# Patient Record
Sex: Female | Born: 1970 | Race: Black or African American | Hispanic: No | Marital: Single | Smoking: Current every day smoker
Health system: Southern US, Community
[De-identification: ages and names within clinical notes are randomized; demographics above are authoritative.]

## PROBLEM LIST (undated history)

## (undated) DIAGNOSIS — I10 Essential (primary) hypertension: Secondary | ICD-10-CM

## (undated) DIAGNOSIS — F329 Major depressive disorder, single episode, unspecified: Secondary | ICD-10-CM

## (undated) DIAGNOSIS — M7710 Lateral epicondylitis, unspecified elbow: Secondary | ICD-10-CM

## (undated) DIAGNOSIS — E785 Hyperlipidemia, unspecified: Secondary | ICD-10-CM

## (undated) DIAGNOSIS — K219 Gastro-esophageal reflux disease without esophagitis: Secondary | ICD-10-CM

## (undated) DIAGNOSIS — F1910 Other psychoactive substance abuse, uncomplicated: Secondary | ICD-10-CM

## (undated) DIAGNOSIS — F32A Depression, unspecified: Secondary | ICD-10-CM

## (undated) DIAGNOSIS — E079 Disorder of thyroid, unspecified: Secondary | ICD-10-CM

## (undated) DIAGNOSIS — I639 Cerebral infarction, unspecified: Secondary | ICD-10-CM

## (undated) DIAGNOSIS — F419 Anxiety disorder, unspecified: Secondary | ICD-10-CM

## (undated) HISTORY — DX: Anxiety disorder, unspecified: F41.9

## (undated) HISTORY — DX: Gastro-esophageal reflux disease without esophagitis: K21.9

## (undated) HISTORY — DX: Other psychoactive substance abuse, uncomplicated: F19.10

## (undated) HISTORY — PX: TUBAL LIGATION: SHX77

## (undated) HISTORY — DX: Hyperlipidemia, unspecified: E78.5

## (undated) HISTORY — PX: APPENDECTOMY: SHX54

## (undated) HISTORY — DX: Depression, unspecified: F32.A

## (undated) HISTORY — DX: Disorder of thyroid, unspecified: E07.9

---
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
## 2000-12-04 ENCOUNTER — Emergency Department (HOSPITAL_COMMUNITY): Admission: EM | Admit: 2000-12-04 | Discharge: 2000-12-04 | Payer: Self-pay | Admitting: Emergency Medicine

## 2000-12-04 ENCOUNTER — Encounter: Payer: Self-pay | Admitting: Emergency Medicine

## 2001-07-06 ENCOUNTER — Emergency Department (HOSPITAL_COMMUNITY): Admission: EM | Admit: 2001-07-06 | Discharge: 2001-07-06 | Payer: Self-pay | Admitting: Internal Medicine

## 2001-10-27 ENCOUNTER — Emergency Department (HOSPITAL_COMMUNITY): Admission: EM | Admit: 2001-10-27 | Discharge: 2001-10-27 | Payer: Self-pay | Admitting: Emergency Medicine

## 2001-11-01 ENCOUNTER — Ambulatory Visit (HOSPITAL_COMMUNITY): Admission: RE | Admit: 2001-11-01 | Discharge: 2001-11-01 | Payer: Self-pay | Admitting: *Deleted

## 2001-12-18 ENCOUNTER — Encounter: Payer: Self-pay | Admitting: *Deleted

## 2001-12-18 ENCOUNTER — Emergency Department (HOSPITAL_COMMUNITY): Admission: EM | Admit: 2001-12-18 | Discharge: 2001-12-18 | Payer: Self-pay | Admitting: *Deleted

## 2002-06-21 ENCOUNTER — Encounter: Payer: Self-pay | Admitting: *Deleted

## 2002-06-21 ENCOUNTER — Ambulatory Visit (HOSPITAL_COMMUNITY): Admission: RE | Admit: 2002-06-21 | Discharge: 2002-06-21 | Payer: Self-pay | Admitting: *Deleted

## 2002-08-17 ENCOUNTER — Encounter: Payer: Self-pay | Admitting: *Deleted

## 2002-08-17 ENCOUNTER — Ambulatory Visit (HOSPITAL_COMMUNITY): Admission: RE | Admit: 2002-08-17 | Discharge: 2002-08-17 | Payer: Self-pay | Admitting: *Deleted

## 2002-09-04 ENCOUNTER — Ambulatory Visit (HOSPITAL_COMMUNITY): Admission: AD | Admit: 2002-09-04 | Discharge: 2002-09-04 | Payer: Self-pay | Admitting: *Deleted

## 2002-11-07 ENCOUNTER — Ambulatory Visit (HOSPITAL_COMMUNITY): Admission: AD | Admit: 2002-11-07 | Discharge: 2002-11-07 | Payer: Self-pay | Admitting: *Deleted

## 2002-11-09 ENCOUNTER — Inpatient Hospital Stay (HOSPITAL_COMMUNITY): Admission: AD | Admit: 2002-11-09 | Discharge: 2002-11-12 | Payer: Self-pay | Admitting: *Deleted

## 2003-01-14 ENCOUNTER — Emergency Department (HOSPITAL_COMMUNITY): Admission: EM | Admit: 2003-01-14 | Discharge: 2003-01-14 | Payer: Self-pay | Admitting: *Deleted

## 2003-06-12 ENCOUNTER — Emergency Department (HOSPITAL_COMMUNITY): Admission: EM | Admit: 2003-06-12 | Discharge: 2003-06-12 | Payer: Self-pay | Admitting: Emergency Medicine

## 2003-09-12 ENCOUNTER — Encounter: Payer: Self-pay | Admitting: General Surgery

## 2003-09-12 ENCOUNTER — Ambulatory Visit (HOSPITAL_COMMUNITY): Admission: RE | Admit: 2003-09-12 | Discharge: 2003-09-12 | Payer: Self-pay | Admitting: General Surgery

## 2003-10-10 ENCOUNTER — Observation Stay (HOSPITAL_COMMUNITY): Admission: AD | Admit: 2003-10-10 | Discharge: 2003-10-11 | Payer: Self-pay | Admitting: *Deleted

## 2003-11-16 ENCOUNTER — Emergency Department (HOSPITAL_COMMUNITY): Admission: EM | Admit: 2003-11-16 | Discharge: 2003-11-16 | Payer: Self-pay | Admitting: Emergency Medicine

## 2004-01-03 ENCOUNTER — Inpatient Hospital Stay (HOSPITAL_COMMUNITY): Admission: RE | Admit: 2004-01-03 | Discharge: 2004-01-05 | Payer: Self-pay | Admitting: *Deleted

## 2004-04-02 ENCOUNTER — Emergency Department (HOSPITAL_COMMUNITY): Admission: EM | Admit: 2004-04-02 | Discharge: 2004-04-02 | Payer: Self-pay | Admitting: Emergency Medicine

## 2006-12-28 ENCOUNTER — Emergency Department (HOSPITAL_COMMUNITY): Admission: EM | Admit: 2006-12-28 | Discharge: 2006-12-28 | Payer: Self-pay | Admitting: Emergency Medicine

## 2007-10-22 ENCOUNTER — Emergency Department (HOSPITAL_COMMUNITY): Admission: EM | Admit: 2007-10-22 | Discharge: 2007-10-22 | Payer: Self-pay | Admitting: Emergency Medicine

## 2007-11-18 ENCOUNTER — Emergency Department (HOSPITAL_COMMUNITY): Admission: EM | Admit: 2007-11-18 | Discharge: 2007-11-18 | Payer: Self-pay | Admitting: Emergency Medicine

## 2008-10-05 ENCOUNTER — Emergency Department (HOSPITAL_COMMUNITY): Admission: EM | Admit: 2008-10-05 | Discharge: 2008-10-05 | Payer: Self-pay | Admitting: Emergency Medicine

## 2008-11-15 ENCOUNTER — Emergency Department (HOSPITAL_COMMUNITY): Admission: EM | Admit: 2008-11-15 | Discharge: 2008-11-15 | Payer: Self-pay | Admitting: Emergency Medicine

## 2009-12-28 ENCOUNTER — Emergency Department (HOSPITAL_COMMUNITY): Admission: EM | Admit: 2009-12-28 | Discharge: 2009-12-28 | Payer: Self-pay | Admitting: Emergency Medicine

## 2011-05-15 NOTE — Op Note (Signed)
NAME:  Alicia Flowers, Alicia Flowers                       ACCOUNT NO.:  [id number]   MEDICAL RECORD NO.:  [id number]                   PATIENT TYPE:  INP   LOCATION:  [id number]                                 FACILITY:  [facility]   PHYSICIAN:  Benedetto Goad, M.D.                DATE OF BIRTH:  [birth year]   DATE OF PROCEDURE:  01/03/2004  DATE OF DISCHARGE:                                 OPERATIVE REPORT   PREOPERATIVE DIAGNOSES:  1. 39-week intrauterine pregnancy.  2. Previous low transverse cesarean section.  3. Patient desires permanent sterilization.   PROCEDURE:  1. Repeat low transverse cesarean section with delivery of a 7 pound 2 ounce     female infant.  2. Intraoperative bilateral tubal ligation utilizing modified Pomeroy     technique.   SURGEON:  Benedetto Goad, M.D.   ESTIMATED BLOOD LOSS:  800 cc.   ANALGESIC:  Spinal.   SPECIMENS:  Arterial cord gas and cord blood to pathology laboratory.  The  placenta is examined and noted to be apparently intact with a three-vessel  umbilical cord and thus discarded.  Bilateral portion of the fallopian tubes  for permanent section only with suture material through a portion of the  left fallopian tube.   DRAINS:  JP drain within the subcutaneous space.  Foley catheter to straight  drainage.   PEDIATRICIAN:  Dr. Anitra Lauth.   FINDINGS AT THE TIME OF SURGERY:  Some fine adhesive disease within the  adnexal regions between the ovary and fallopian tube which were cauterized.  In addition, there were some omental adhesions to the anterior abdominal  wall which likewise were excised utilizing dissection utilizing cauterizing  and cutting cautery.   DESCRIPTION OF PROCEDURE:  The patient was taken to the operating room.  Vital signs were stable.  The patient had had a nonstress test performed on  the fourth floor.  The patient had a spinal analgesic administered by the  anesthesia staff, taking careful note of the patient's prior spinal which  had  resulted in a very high block.  Thus, a lower dose of medication was  used.  The patient was then placed on the OR table with a slight left  lateral tilt and prepped and draped in the usual sterile manner.  A Foley  catheter was sterilely placed to straight drainage.   After assurance of adequate surgical analgesia, a sharp knife was used to  excise the previous midline abdominal incision, bisected down to the fascial  plane utilizing a sharp knife.  Bleeders were cauterized along the way.  The  fascia was then excised in a vertical manner and then sharply dissected off  of the underlying rectus muscles.  The peritoneal cavity was atraumatically  bluntly entered at the superior-most portion of the incision.  The  peritoneal incision extended superiorly and inferiorly.  Inferiorly, we  directly visualized the bladder to avoid accidental entry.  A bladder blade  was then placed.  The lower uterine segment was identified.  A bladder flap  was created from the vesicouterine fold utilizing sharp dissection with the  Metzenbaum scissors.  A low transverse uterine incision was then scored  utilizing the Metzenbaum scissors.  The bladder flap was pushed distal to  our lower uterine segment.  A sharp knife was then used to incise a low  transverse uterine incision.  An intact amniotic sac was encountered in the  midline.  My index fingers were then used to bluntly extend the uterine  incision bilaterally.  The head of the infant was elevated and flexed and  easily delivered through the uterine incision.  The mouth and nares were  bulb suctioned of clear amniotic fluid.  Gentle traction combined with  expulsive efforts through our assistant by giving fundal pressure resulted  in very easy delivery of the infant through the uterine incision without  difficulty.  A spontaneous and vigorous breathe and cry was noted.  The  umbilical cord was milked towards the infant.  The cord was doubly clamped  and  cut, and the infant was handed to the awaiting pediatrician, Dr.  Anitra Lauth.  Arterial cord gas and cord blood were then obtained from the  umbilical cord.  Gentle traction on the umbilical cord resulted in  separation which upon examination appeared to be an intact placenta with  associated three-vessel umbilical cord.  The uterus was then exteriorized.  The uterine incision was noted to have not extended.  Intrauterine  exploration revealed no retained placental fragments.  Portions of the  membranes were removed.  Excellent uterine tone was achieved after  initiation of Pitocin and massage of the uterus.  The uterine incision was  then closed in two layers with 0 chromic in a running locked fashion, the  second layer being an imbricating layer.  Several additional figure-of-eight  sutures were required to result in hemostasis along the entirety of the  incision.  The bladder flap was reapproximated with the second suture layer.  The uterine incision was noted to be dry with no active bleeding.  The cul-  de-sac was then irrigated free of all clots.  The filmy adhesions in the  adnexal region were then cauterized.   Each of the fallopian tubes were then identified and confirmed by tracing  them out to their fimbriated ends.  Each fallopian tube was then elevated in  its midportion.  A modified Pomeroy was then performed bilaterally utilizing  #1 plain suture at the base of the __________.  A neck of right fallopian  tube as well as left fallopian tube was then excised.  A suture was placed  __________ sent off for permanent section only.  The tubal ligation  procedure was noted to be with secure hemostasis.  The uterus was then  returned to the pelvic cavity.  Sponge, needle, and instrument counts were  correct x2 at this point.  The peritoneal edges were thus grasped using  Kelly clamps.  Irrigation was performed until clear.  At this point in time, the omental adhesions described  previously were  sharply dissected utilizing cautery.  A single-layer closure was then  performed utilizing a #1 Prolene looped suture which went through the  fascial edges as well as through the peritoneal edges, resulting in  reapproximation.  An additional #1 single standard Prolene suture was then  placed over the top of this to reinforce our fascial closure.  This resulted  in a secure closure.  No subcutaneous bleeders were noted.  A JP drain was  placed within the subcutaneous space with a separate exit wound through the  right apex of the incision.  This was sutured into place with an Ethilon  suture.  About 30 cc of 0.5% bupivacaine plain was then injected along the  skin incision to facilitate analgesia at this point in time.  Two horizontal  mattress sutures of #1 Prolene were then placed to assist in stapling.  The  entirety of the skin incision was then closed utilizing skin staples.   The patient tolerated the procedure very well.  Vital signs remained stable.  She was taken to the recovery room in stable condition.      ___________________________________________                                            Benedetto Goad, M.D.   DC/MEDQ  D:  01/03/2004  T:  01/03/2004  Job:  [id number]

## 2011-05-15 NOTE — Op Note (Signed)
NAME:  Alicia, Flowers                       ACCOUNT NO.:  [id number]   MEDICAL RECORD NO.:  [id number]                   PATIENT TYPE:  INP   LOCATION:  LDR4                                 FACILITY:  [facility]   PHYSICIAN:  Benedetto Goad, M.D.                DATE OF BIRTH:  1971/07/16   DATE OF PROCEDURE:  11/10/2002  DATE OF DISCHARGE:  11/12/2002                                 OPERATIVE REPORT   PREOPERATIVE DIAGNOSES:  1. 39+ week intrauterine pregnancy for induction of labor.  2. Arrest of dilatation at 9 cm secondary to cephalopelvic disproportion.  3. Protracted active phase of labor.   POSTOPERATIVE DIAGNOSES:  1. 39+ week intrauterine pregnancy for induction of labor.  2. Arrest of dilatation at 9 cm secondary to cephalopelvic disproportion.  3. Protracted active phase of labor.   PROCEDURE PERFORMED:  Primary low transverse cesarean section, delivery of 6  pounds 15 ounces female infant.   PERTINENT FINDINGS AT TIME OF OPERATIVE PROCEDURE:  Cephalic presentation,  however, the infant was noted to be in a LOP position.   ESTIMATED BLOOD LOSS:  800 cc.   ANALGESIA:  Epidural for the course of labor which would have been  inadequate for cesarean section, thus the epidural was discontinued.  A  spinal analgesic was then administered by the anesthesia staff, however,  there were complications in that there was evidence that this would be an  excessively high spinal as the patient had difficulty breathing, thus this  was converted to general endotracheal anesthesia.   DESCRIPTION OF PROCEDURE:  The patient had been previously sterilely prepped  and draped and a Foley catheter had been sterilely placed to straight  drainage with findings of clear yellow urine.  After induction of  anesthesia, a sharp knife was used to excise the previous midline abdominal  incision which was dissected down to the fascial plane utilizing a sharp  knife and cauterizing all bleeders along  the way.  The fascia was then  incised in a vertical manner utilizing the Mayo scissors while sharply  dissecting off the underlying rectus muscle.  The rectus muscle was then  bluntly separated and the peritoneal cavity atraumatically bluntly entered.  The superior most portion of the peritoneal incision was extended superiorly  and inferiorly with direct vision of the bladder to avoid accidental entry.  A bladder blade was then placed and the lower uterine segment was  identified.  A bladder flap was then created from the vesicouterine fold  utilizing sharp dissection with the Metzenbaum scissors.  A sharp knife was  then used to score a low transverse uterine incision.  Clear amniotic fluid  was noted.  My index finger was then used to bluntly extend the uterine  incision bilaterally and my right hand then reached into the uterine cavity  and the head of the infant was flexed and elevated with gentle traction and  fundal pressure.  The head in the LOP position was noted to deliver through  the uterine incision without difficulty.  The mouth and nares were bulb  suctioned of clear amniotic fluid.  Gentle traction resulted in delivery of  the remainder of the infant.  The cord was then milked towards the infant,  the cord was doubly clamped and cut, and the infant was handed to the  awaiting pediatrician, Dr. Meda Klinefelter.  Arterial cord gas and cord blood  were then obtained.  Gentle traction on the umbilical cord resulted in  separation which upon examination appeared to be intact with a three vessel  placenta and attached cord, this was sent for permanent pathology.  The  uterus was then exteriorized and exploration revealed normal retained  placental fragments.  The uterine incision was then closed in two layers  with 0 chromic in a running, locked fashion and the second layer with  imbricating layers.  Very fine filmy adhesion disease was encountered on the  adnexa bilaterally, this  was cauterized and dissected free.  There were  noted to be omental adhesions to the anterior abdominal wall at the left  portion of the incision, these were left undisturbed.  Hemostasis was  assured.  The uterus was returned to the pelvic cavity.  Irrigation was  performed until clear.  The peritoneal edges were then grasped using Kelly  clamps, sponge and instrument counts were correct x2 at this point, and the  peritoneum was closed with a continuous, running 0 chromic suture.  The  fascia and overlying rectus muscles were then closed with a double-stranded  looped #1 PDS suture which resulted in a secure closure.  Subcutaneous  bleeders were then cauterized, a JP drain was placed in the subcutaneous  space with a separate exit wound to the right apex of the incision and this  was suture into place separately.  The skin was then closed utilizing skin  staples.  Three inter #1 Maxon sutures were placed through-and-through as  retention-type stitches.  A total of 30 cc of 0.5% bupivacaine plain was  then injected subcutaneously to facilitate postoperative anesthesia.                                               Benedetto Goad, M.D.    DC/MEDQ  D:  11/13/2002  T:  11/13/2002  Job:  [id number]

## 2011-05-15 NOTE — Op Note (Signed)
[facility]  Patient:    [name], [name] Visit Number: [id number] MRN: [id number]          Service Type: DSU Location: DAY Attending Physician:  Rayna Sexton. Dictated by:   Benedetto Goad, M.D. Proc. Date: 11/01/01 Admit Date:  11/01/2001 Discharge Date: 11/01/2001                             Operative Report  PREOPERATIVE DIAGNOSIS:  Ten-week intrauterine pregnancy with incomplete miscarriage.  POSTOPERATIVE DIAGNOSIS:  Ten-week intrauterine pregnancy with incomplete miscarriage.  PROCEDURE PERFORMED:  Dilatation and curettage utilizing a #8 ______ catheter tip.  SURGEON:  Benedetto Goad, M.D.  ESTIMATED BLOOD LOSS:  Minimal.  COMPLICATIONS:  None.  ANESTHESIA:  Paracervical block placed by Dr. Benedetto Goad and appropriate sedation per the anesthesia staff.  OPERATIVE FINDINGS:  The planned procedure was discussed again with the patient in the preoperative holding area.  She was taken to the operating room where she was placed in the dorsal lithotomy position.  The perineal area as well as the cervix were then prepped with a Betadine solution.  Posterior lip of the cervix was grasped with a single-tooth tenaculum and a paracervical block was then placed by utilizing ______ 10 cc of 1% lidocaine plain, placing it at 2 oclock, 4 oclock, 8 oclock and 10 oclock and avoiding intravascular injection.  The uterus was noted to sound to a depth of 8 cm with the external cervix as well as the endocervix ______ Dictated by:   Benedetto Goad, M.D. Attending Physician:  Rayna Sexton. DD:  11/02/01 TD:  11/04/01 Job: [id number]

## 2011-05-15 NOTE — H&P (Signed)
[facility]  Patient:    Alicia Flowers, Alicia Flowers Visit Number: [id number] MRN: [id number]          Service Type: EMS Location: ED Attending Physician:  Prentiss Bells Dictated by:   Benedetto Goad, M.D. Admit Date:  10/27/2001 Discharge Date: 10/27/2001                           History and Physical  HISTORY OF PRESENT ILLNESS:  This 40 year old gravida 1, para 0 with a last menstrual period of August 25, 2001.  The patient is absolutely certain regarding the last menstrual period.  The patient presents today as a new OB patient for initial evaluation.  The patient had a positive home pregnancy test both 1 week ago and 2 weeks ago.  She does have some nausea, but no vomiting as well as some breast tenderness; however, this has diminished over the past week.  The patient does complain of the onset of vaginal bleeding October 27, 2001 and it has continued since that point in time with light bleeding noted each day on her pad.  She denies any cramping.  She has passed some small clots.  PAST MEDICAL/SURGICAL HISTORY:  She has one prior hospitalization at age 76 for appendectomy.  No other medical or surgical history.  ALLERGIES:  She has mo known drug allergies..  CURRENT MEDICATIONS:  None.  PHYSICAL EXAMINATION:  GENERAL:  A black female in no acute distress  VITAL SIGNS:  Blood pressure 146/78, pulse rate of 80, respiratory rate is 20.   HEENT:  Negative.  NECK:  No adenopathy.  Neck is supple.  Thyroid is nonpalpable.  LUNGS:  Clear.  CARDIOVASCULAR:  Regular rate and rhythm.  ABDOMEN:  The abdomen is soft and nontender.  Uterus minimally enlarged.  PELVIC:  Sterile speculum examination reveals the presence of some dark red and brownish bleeding from the endocervix and the endocervix is noted to be already dilated with the easy passage of a small Q-tip.  Bimanual examination reveals a minimally increased sized uterus with no adnexal  masses.  LABORATORY DATA:  Both transabdominal and transvaginal ultrasound are performed.  Transabdominal ultrasound was unsatisfactory with no visualization of intrauterine contents.  A transvaginal scan was then performed which reveals a definite intrauterine sac.  The maximum sac diameter is measured at 1.41 cm; however, within that sac no pregnancy tissues are identified. Specifically there is no yolk sac, no fetal pole, and no fetal cardiac activity identified.  Laboratory studies:  CBC, type and screen, and quantitative beta hCG are requested.  Pertinently the patient had laboratory studies done October 27, 2001 at which time hemoglobin was 12.3, hematocrit 36.8 and quantitative hCG of 5492.  ASSESSMENT: The patient with missed abortion due to certainty of menstrual dates and the abnormal appearance of the pregnancy on ultrasound.  The patient is currently having vaginal bleeding, but no cramping.  PLAN:  Extensive discussion held with the patient regarding options to include expectant management versus scheduling for a dilatation and curettage.  The patient is comfortable with the diagnosis of missed abortion.  She understands and accepts the inevitability of miscarriage of this pregnancy.  Thus, at this point in time, the patient would like to proceed with dilatation and curettage on November 01, 2001.  She is referred to [facility] for preoperative laboratory assessment at this time. Dictated by:   Benedetto Goad, M.D. Attending Physician:  Prentiss Bells DD:  10/31/01  TD:  11/01/01 Job: [id number]

## 2011-05-15 NOTE — Discharge Summary (Signed)
NAME:  Alicia Flowers, Alicia Flowers                       ACCOUNT NO.:  [id number]   MEDICAL RECORD NO.:  [id number]                   PATIENT TYPE:  INP   LOCATION:  LDR4                                 FACILITY:  [facility]   PHYSICIAN:  Benedetto Goad, M.D.                DATE OF BIRTH:  [birth year]   DATE OF ADMISSION:  11/09/2002  DATE OF DISCHARGE:  11/12/2002                                 DISCHARGE SUMMARY   DIAGNOSIS:  39-week intrauterine pregnancy for induction of  labor.   PROCEDURE:  11/09/02 - placement of continuous lumbar epidural analgesia  during the course of labor by Dr. Benedetto Goad.  11/10/02 - primary low transverse cesarean section.  Delivered 6 pound 15  ounce female infant.   DISPOSITION:  The patient  is to follow up in the office in four days time  for staple removal from the midline abdominal incision. JP drain is removed  on day of discharge.   DISCHARGE MEDICATIONS:  1. Tylox for pain relief.  2. The patient does request treatment for fluid retention.  However, she is     breast feeding and thus diuretics are contraindicated.   PERTINENT LABORATORY DATA:  Admission hemoglobin, hematocrit 11.4/34.8 with  a white count of 10.2.  Postoperative day #1, 9.7/29.5 with a white count of  23.6.  White blood counts had normalized at time of discharge.   HOSPITAL COURSE:  The patient was admitted for induction of labor.  Initial  examination cervix was 3 cm dilated.  This was on 11/09/02.  Amniotomy was  performed with findings of clear amniotic fluid.  The patient with onset of  discomfort with uterine contractions and had an epidural catheter placed.  Thereafter the patient was continued on the Pitocin.  She did have a  protracted active phase, but was noted to be making cervical change.  The  patient was noted to progress to 9 cm dilatation at which time she had  arrested dilatation with no further cervical change x2 hours duration.  Thus, decision was made to proceed  with primary low transverse cesarean  section.  The epidural had required repetitive doses of 2% lidocaine to  relieve perineal pressure.  Thus, it was felt due to large volume of fluid  within the epidural space, the epidural would be inadequate for surgical  anesthesia.  Thus, in the operating room a spinal analgesic was administered  by the anesthesia staff.  However, this required revision to general  endotracheal.  The operative procedure was performed without complications.  Postoperatively, the patient did well.  She did have a JP catheter in place  in the subcutaneous space which drained very well and was removed on day of  discharge.  Foley catheter was  left in place x24 hours duration with  findings of clear yellow urine.  The patient's vital signs remained stable.  She was able to ambulate, tolerate regular general diet.  Thus, the patient  was discharged to home on postoperative day #2 given a copy of standardized  discharge instructions.  She is breast feeding at time of discharge.                                               Benedetto Goad, M.D.   DC/MEDQ  D:  11/13/2002  T:  11/13/2002  Job:  [id number]

## 2011-05-15 NOTE — H&P (Signed)
Alicia Flowers, Alicia Flowers                         ACCOUNT NO.:  [id number]   MEDICAL RECORD NO.:  [id number]                  PATIENT TYPE:   LOCATION:                                       FACILITY:   PHYSICIAN:  Benedetto Goad, M.D.                DATE OF BIRTH:   DATE OF ADMISSION:  01/03/2004  DATE OF DISCHARGE:                                HISTORY & PHYSICAL   This is a 40 year old gravida 3, para 1 at [redacted] weeks gestation who is  admitted for elective repeat cesarean section and intraoperative tubal  ligation.  The patient has 1 prior low transverse cesarean section on  November 10, 2002 and she agrees to proceed with elective repeat.  In  addition, the patient desires permanent sterilization to be performed during  this operative procedure.  She understands that are reversible forms of  birth control available. Likewise she accepts the inherent 2% failure rate  associated with the procedure.  She has been consulted extensively during  this prenatally and has never wavered in her decision to have permanent  sterilization performed.   The patient's prenatal course is uncomplicated. She was noted to have a  normal glucose tolerance test during previous pregnancy at 110.  She is A  positive blood type. The patient did present too late to have AFP triple  screening performed.  She did receive an influenza vaccine on November 19, 2003.  The patient was screened for group B Beta Strep on December 18, 2003  and was noted to be negative for a carrier status.   PAST MEDICAL HISTORY:  She had appendectomy at age 81 through a midline  incision.  She has prior low transverse cesarean section November 10, 2002  with delivery of a 6 pound 15 ounce infant.  The patient breast fed  following delivery. Indication for C-section was arrested dilatation at 9 cm  secondary to CPD.  Complications noted during the primary C-section:  The  patient had had an epidural placed during the course of labor,  however, this  did not provide adequate surgical analgesia thus a spinal was administered  by the anesthesia staff.  Notably the patient was noted to experience a high  spinal and required induction of general anesthesia for ventilatory support.  She did well throughout the remainder of the operative procedure and  subsequently had an uncomplicated postoperative course.   SOCIAL HISTORY:  The patient does smoke 1 pack per day.  The father of the  baby is deceased.  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   PHYSICAL EXAMINATION:  GENERAL:  On physical examination in no acute  distress.  VITAL SIGNS:  Height is 5 feet 3 inches.  Prepregnancy weight 145, most  recent weight 196. Blood pressure 122/66.  Pulse rate of 80, respiratory  rate is 20.  HEENT:  Negative. No adenopathy.  NECK:  Supple. Thyroid is not palpable.  LUNGS:  Clear.  CARDIOVASCULAR:  Regular rate and rhythm.  ABDOMEN:  The abdomen is soft and nontender.  Midline abdominal incision is  noted.  EXTREMITIES:  Noted to be normal.  PELVIC EXAM:  Normal external genitalia.  No lesions or ulcerations  identified.  No vaginal bleeding or leakage of fluid.   ASSESSMENT:  A 39 week intrauterine pregnancy.  A previous low-transverse  cesarean section.  The patient admitted for elective repeat as well as  intraoperative tubal ligation.  She does plan on bottle feeding during this  postpartum period.  Again, of note is that permanent sterilization will be  performed during the operative procedure.     ___________________________________________                                         Benedetto Goad, M.D.   DC/MEDQ  D:  01/02/2004  T:  01/02/2004  Job:  [id number]

## 2011-05-15 NOTE — Discharge Summary (Signed)
NAME:  Alicia Flowers, Alicia Flowers                       ACCOUNT NO.:  [id number]   MEDICAL RECORD NO.:  [id number]                   PATIENT TYPE:  INP   LOCATION:  [id number]                                 FACILITY:  [facility]   PHYSICIAN:  Benedetto Goad, M.D.                DATE OF BIRTH:  1971-11-27   DATE OF ADMISSION:  01/03/2004  DATE OF DISCHARGE:  01/05/2004                                 DISCHARGE SUMMARY   Repeat C-section, female infant.  Patient declined circumcision.   DISPOSITION:  Followup in four day's time for staple removal.   DISCHARGE MEDICATIONS:  Tylox #20 with no refills.   Given standard discharge instructions.   LABORATORY:  A positive blood type.  GBS negative.  Bottle feeding.  Hemoglobin 11.4, hematocrit 34.3, white count of 12.0.   HOSPITAL COURSE:  See previous dictations.  Uneventful repeat C-section,  tubal ligation on January 03, 2004.  The patient remained afebrile.  Abdomen  remained soft, nondistended.  The patient advanced her diet.  Tolerated a  regular general diet with resumption of bowel function.  Thus discharged to  home on January 05, 2004.     ___________________________________________                                         Benedetto Goad, M.D.   DC/MEDQ  D:  01/05/2004  T:  01/05/2004  Job:  [id number]

## 2011-05-15 NOTE — Discharge Summary (Signed)
   NAME:  Alicia Flowers, Alicia Flowers                       ACCOUNT NO.:  [id number]   MEDICAL RECORD NO.:  [id number]                   PATIENT TYPE:  OBV   LOCATION:  A414                                 FACILITY:  [facility]   PHYSICIAN:  Benedetto Goad, M.D.                DATE OF BIRTH:  [birth year]   DATE OF ADMISSION:  10/10/2003  DATE OF DISCHARGE:  10/11/2003                                 DISCHARGE SUMMARY   HISTORY OF PRESENT ILLNESS:  This is a 40 year old, G2, P56, 40 weeks'  gestation with one prior low transverse cesarean section presents to [facility] on the p.m. of October 10, 2003, with the chief complaint of  something does not feel right.  The patient initially evaluated by Daiva Nakayama, N.M. who gave a verbal orders only.  Subsequently when I returned to  my call responsibilities, I evaluated the patient and performed observation  services on October 11, 2003.   PHYSICAL EXAMINATION:  GENERAL:  The patient was noted to be in no acute  distress.  VITAL SIGNS:  Temperature 97.3, pulse 87, respirations 20, blood pressure  102/56.  ABDOMEN:  Soft and nontender.  Normal uterine tone noted.  Pfannenstiel  incision noted to be intact.  PELVIC:  Normal external genitalia.  No lesions or ulcerations identified.  The cervix was noted to be closed.  External fetal monitoring with no  uterine contractions identified.  Fetal heart rate is noted to be normal for  27 weeks' gestation.  No fetal heart rate decelerations are noted.   DISCHARGE DIAGNOSES:  1. Intrauterine pregnancy at 27 weeks.  2. Previous low transverse cesarean section.  3. Abdominal pain, not labor related.   PLAN:  Signs and symptoms of labor as well as spontaneous rupture of  membranes were reviewed with the patient who is discharged to home on the  same date of service.      ___________________________________________                                         Benedetto Goad, M.D.   DC/MEDQ  D:   10/23/2003  T:  10/23/2003  Job:  [id number]

## 2011-05-15 NOTE — H&P (Signed)
NAME:  Alicia Flowers, FRANCISCO                       ACCOUNT NO.:  [id number]   MEDICAL RECORD NO.:  [id number]                   PATIENT TYPE:  INP   LOCATION:  LDR4                                 FACILITY:  [facility]   PHYSICIAN:  Benedetto Goad, M.D.                DATE OF BIRTH:  [birth year]   DATE OF ADMISSION:  11/09/2002  DATE OF DISCHARGE:  11/12/2002                                HISTORY & PHYSICAL   HISTORY OF PRESENT ILLNESS:  A 40 year old gravida 2 para 0 at 39+ weeks  gestation is admitted for induction of labor.  Pertinently, the patient had  been seen in the office two days previously on November 07, 2002 at which  time she complained of markedly-decreased fetal movement.  She was referred  to [facility] at that time, at which time she was found to have a reactive  nonstress test.  The patient's prenatal course complicated only by a  positive drug screen on initial visit for marijuana.  The patient denies  ongoing use during the pregnancy.  She is A positive blood type.   PAST SURGICAL HISTORY:  She had spontaneous AB x1.   PAST MEDICAL HISTORY:  Negative.  The patient did have an appendectomy at  age 30 which she states was infected at that time.   ALLERGIES:  No known drug allergies.   CURRENT MEDICATIONS:  Prenatal vitamins.   PHYSICAL EXAMINATION:  VITAL SIGNS:  Height 5 feet 3 inches, 165 pounds  prepregnancy; current weight 200 pounds.  Blood pressure 116/68, pulse rate  80, respiratory rate 20.  HEENT:  Negative, no adenopathy.  NECK:  Supple.  Thyroid is nonpalpable.  LUNGS:  Clear.  CARDIOVASCULAR:  Regular rate and rhythm.  ABDOMEN:  Soft and nontender.  No surgical scars are identified with the  exception of the large midline scar from previous appendectomy.  She is  vertex presentation by Leopold's maneuvers.  Fundal height is measured at 36  cm.  EXTREMITIES:  Only trace pretibial edema.  PELVIC:  Normal external genitalia.  No lesions or ulcerations  identified.  Cervix is 3 cm dilated, 70% effaced, 0 station, with vertex presentation  confirmed.  MONITOR:  There is a reassuring fetal heart rate on external fetal monitor  and no uterine contractions are identified.   ASSESSMENT:  1. Intrauterine pregnancy at 39+ weeks.  2. Decreased fetal movement two days previously.    PLAN:  The patient is admitted for induction of labor at this time.  Due to  the moderately favorable nature of the cervix we will proceed with amniotomy  after which time we can induce or augment with Pitocin as clinically  indicated.  Benedetto Goad, M.D.    DC/MEDQ  D:  11/13/2002  T:  11/13/2002  Job:  [id number]

## 2011-05-15 NOTE — Op Note (Signed)
   NAME:  Alicia Flowers, ROETHLISBERGER                       ACCOUNT NO.:  [id number]   MEDICAL RECORD NO.:  [id number]                   PATIENT TYPE:  INP   LOCATION:  LDR4                                 FACILITY:  [facility]   PHYSICIAN:  Benedetto Goad, M.D.                DATE OF BIRTH:  Oct 17, 1971   DATE OF PROCEDURE:  11/09/2002  DATE OF DISCHARGE:  11/12/2002                                 OPERATIVE REPORT   PROCEDURE:  Placement of cutaneous lumbar epidural analgesia at the L3-4  interspace performed by Benedetto Goad, M.D.   COMPLICATIONS:  None.   SPECIMENS:  None.   DESCRIPTION OF PROCEDURE:  Appropriate informed consent was obtained.  The  patient was placed in a seated position, at which time the bony landmarks  were identified.  The L3-4 interspace was chosen.  The patient's back was  sterilely prepped and draped utilizing the epidural kit.  Five cubic  centimeters of 1% lidocaine injected at the midline at the L3-4 interspace  to raise a small skin wheal.  A 17-gauge Tuohy-Schliff needle is then  utilized with loss of resistance and an air-filled glass syringe to identify  entry into the epidural space on the first attempt without difficulty.  Excellent loss of resistance was noted.  Initial test dose of 5 cc of 1%  lidocaine plus epinephrine injected through the epidural needle and no signs  of CSF or intravascular injection obtained.  Once the epidural catheter was  inserted to a depth of 5 cm, aspiration test was negative.  A second test  dose of 2 cc of 1.5% lidocaine plus epinephrine injected through the  epidural catheter.  Again no signs of CSF or intravascular injection  obtained.  Thus the patient is connected to the infusion pump containing the  standard mixture.  She will be treated with a bolus of 10 cc, followed by a  continuous infusion of 14 cc/hr.  Following the procedure and return to the  left lateral supine position, the patient has evidence of an excellent  setting up bilateral block.  There were no complications.                                               Benedetto Goad, M.D.    DC/MEDQ  D:  11/13/2002  T:  11/13/2002  Job:  [id number]

## 2011-09-29 LAB — CBC
Platelets: 262
RBC: 3.67 — ABNORMAL LOW

## 2011-09-29 LAB — DIFFERENTIAL
Basophils Relative: 1
Eosinophils Absolute: 0.2
Eosinophils Relative: 3
Lymphs Abs: 2.3

## 2011-09-29 LAB — PREGNANCY, URINE: Preg Test, Ur: NEGATIVE

## 2011-09-29 LAB — BASIC METABOLIC PANEL
BUN: 10
Chloride: 106
GFR calc Af Amer: >60

## 2011-10-06 LAB — STREP A DNA PROBE: Group A Strep Probe: NEGATIVE

## 2011-10-07 LAB — URINALYSIS, ROUTINE W REFLEX MICROSCOPIC
Bilirubin Urine: NEGATIVE
Glucose, UA: NEGATIVE
Hgb urine dipstick: NEGATIVE
Specific Gravity, Urine: 1.025

## 2011-10-07 LAB — URINE MICROSCOPIC-ADD ON

## 2011-10-07 LAB — PREGNANCY, URINE: Preg Test, Ur: NEGATIVE

## 2011-10-07 LAB — RAPID STREP SCREEN (MED CTR MEBANE ONLY): Streptococcus, Group A Screen (Direct): NEGATIVE

## 2011-10-07 LAB — STREP A DNA PROBE

## 2013-02-02 ENCOUNTER — Encounter (HOSPITAL_COMMUNITY): Payer: Self-pay | Admitting: *Deleted

## 2013-02-02 ENCOUNTER — Emergency Department (HOSPITAL_COMMUNITY)
Admission: EM | Admit: 2013-02-02 | Discharge: 2013-02-02 | Disposition: A | Discharge status: Home / Self Care | Payer: Self-pay | Attending: Emergency Medicine | Admitting: Emergency Medicine

## 2013-02-02 DIAGNOSIS — F17200 Nicotine dependence, unspecified, uncomplicated: Secondary | ICD-10-CM | POA: Insufficient documentation

## 2013-02-02 DIAGNOSIS — L0231 Cutaneous abscess of buttock: Secondary | ICD-10-CM | POA: Insufficient documentation

## 2013-02-02 DIAGNOSIS — L03317 Cellulitis of buttock: Secondary | ICD-10-CM | POA: Insufficient documentation

## 2013-02-02 MED ORDER — CEPHALEXIN 500 MG PO CAPS: 500.0000 mg | ORAL_CAPSULE | Freq: Four times a day (QID) | ORAL | Status: DC

## 2013-02-02 MED ORDER — CEPHALEXIN 500 MG PO CAPS
500.0000 mg | ORAL_CAPSULE | Freq: Once | ORAL | Status: AC
  Administered 2013-02-02: 500 mg via ORAL
  Filled 2013-02-02: qty 1

## 2013-02-02 MED ORDER — SULFAMETHOXAZOLE-TMP DS 800-160 MG PO TABS
1.0000 | ORAL_TABLET | Freq: Once | ORAL | Status: AC
  Administered 2013-02-02: 1 via ORAL
  Filled 2013-02-02: qty 1

## 2013-02-02 MED ORDER — SULFAMETHOXAZOLE-TRIMETHOPRIM 800-160 MG PO TABS: 1.0000 | ORAL_TABLET | Freq: Two times a day (BID) | ORAL | Status: DC

## 2013-02-02 MED ORDER — HYDROCODONE-ACETAMINOPHEN 5-325 MG PO TABS: ORAL_TABLET | ORAL | Status: DC

## 2013-02-02 NOTE — ED Provider Notes (Signed)
History     CSN: [id number]  Arrival date & time 02/02/13  1102   First MD Initiated Contact with Patient 02/02/13 1123      Chief Complaint  Patient presents with  . Abscess    (Consider location/radiation/quality/duration/timing/severity/associated sxs/prior treatment) HPI Comments: Patient complains of a red swollen" pimple" to the right buttocks that began one week ago. She states that she has been applying toothpaste to the area. She reports increased pain redness to the area that finally began to drain a significant amount of pus 4 days ago. She now states the pain to the area has improved, but she has noticed an open lesion that continues to have a small amount of drainage.  She denies fever, chills, nausea or vomiting or history of previous boils or MRSA.  She denies diabetes.  Patient is a 42 y.o. female presenting with abscess. The history is provided by the patient.  Abscess  This is a new problem. Episode onset: One week. The onset was gradual. The problem occurs continuously. The problem has been gradually improving. Affected Location: Right buttock. The problem is moderate. The abscess is characterized by redness and painfulness. It is unknown what she was exposed to. Pertinent negatives include not drinking less, no fever, no diarrhea, no vomiting and no decreased responsiveness. Her past medical history does not include skin abscesses in family. There were no sick contacts. She has received no recent medical care.    History reviewed. No pertinent past medical history.  Past Surgical History  Procedure Date  . Appendectomy   . Tubal ligation     History reviewed. No pertinent family history.  History  Substance Use Topics  . Smoking status: Current Every Day Smoker    Types: Cigarettes  . Smokeless tobacco: Not on file  . Alcohol Use: Yes     Comment: socially    OB History    Grav Para Term Preterm Abortions TAB SAB Ect Mult Living                   Review of Systems  Constitutional: Negative for fever, chills, activity change, appetite change and decreased responsiveness.  Gastrointestinal: Negative for nausea, vomiting, abdominal pain and diarrhea.  Musculoskeletal: Negative for joint swelling and arthralgias.  Skin: Positive for color change and wound.       Abscess   Hematological: Negative for adenopathy.  All other systems reviewed and are negative.    Allergies  Review of patient's allergies indicates no known allergies.  Home Medications   Current Outpatient Rx  Name  Route  Sig  Dispense  Refill  . NAPHAZOLINE-GLYCERIN 0.012-0.2 % OP SOLN   Both Eyes   Place 1-2 drops into both eyes every 4 (four) hours as needed. Redness         . NAPROXEN SODIUM 220 MG PO TABS   Oral   Take 880 mg by mouth 2 (two) times daily as needed. Pain           BP 122/84  Pulse 87  Temp 98.7 F (37.1 C) (Oral)  Resp 18  Ht 5\' 3"  (1.6 m)  Wt 170 lb (77.111 kg)  BMI 30.11 kg/m2  SpO2 100%  LMP 01/14/2013  Physical Exam  Nursing note and vitals reviewed. Constitutional: She is oriented to person, place, and time. She appears well-developed and well-nourished. No distress.  HENT:  Head: Normocephalic and atraumatic.  Cardiovascular: Normal rate, regular rhythm, normal heart sounds and intact distal pulses.  No murmur heard. Pulmonary/Chest: Effort normal and breath sounds normal.  Musculoskeletal: Normal range of motion. She exhibits no edema.  Neurological: She is alert and oriented to person, place, and time. She exhibits normal muscle tone. Coordination normal.  Skin: Skin is warm. There is erythema.          3 cm open lesion to the mid right buttocks.  Mild to moderate induration with slight surrounding erythema. Granulation tissue is present to the margins.  Small amount of purulent material is present within the lesion.    ED Course  Procedures (including critical care time)  Results for orders placed  during the hospital encounter of 02/02/13  GLUCOSE, CAPILLARY      Component Value Range   Glucose-Capillary 99  70 - 99 mg/dL      Abscess culture is pending.    MDM    Patient has an open, 3 cm lesion to the right buttock with mild surrounding erythema and induration.  Pink granulation tissue present to the wound borders.    Patient also seen by the EDP and care plan discussed.  Pt agrees to return here in 4-5 days for recheck, continue warm water soaks and keep lesion bandaged.    Discussed with the patient that on return visit if wound is worsening, possible need for biopsy may be indicated, also discussed possible MRSA infection    Prescribed: kelfex Bactrim DS norco #20  Harshini Trent L. [place], [place] 02/02/13 1243

## 2013-02-02 NOTE — ED Notes (Signed)
Abscess to rt buttock, pt "busted" it and now is "raw" and painful.

## 2013-02-02 NOTE — ED Provider Notes (Signed)
Medical screening examination/treatment/procedure(s) were conducted as a shared visit with non-physician practitioner(s) and myself.  I personally evaluated the patient during the encounter Moderately obese woman with appx 3 cm diameter ulcer on her right buttock.  It had drained spontaneously.  Advised culture, antibiotic Rx with MRSA coverage, F/U in 4-5 days.  If wound does not heal, biopsy may be necessary.   Mylinda Latina III, MD 02/02/13 2028

## 2013-02-05 ENCOUNTER — Telehealth (HOSPITAL_COMMUNITY): Payer: Self-pay | Admitting: Emergency Medicine

## 2013-02-05 LAB — CULTURE, ROUTINE-ABSCESS

## 2013-02-05 NOTE — ED Notes (Signed)
[facility] called and informed Buyer, retail of +MRSA.

## 2013-02-05 NOTE — ED Notes (Signed)
+  MRSA. +Abscess. Patient treated with Septra DS. Sensitive to same. Per protocol MD.

## 2013-07-25 ENCOUNTER — Encounter (HOSPITAL_COMMUNITY): Payer: Self-pay | Admitting: *Deleted

## 2013-07-25 ENCOUNTER — Emergency Department (HOSPITAL_COMMUNITY)
Admission: EM | Admit: 2013-07-25 | Discharge: 2013-07-25 | Disposition: A | Discharge status: Home / Self Care | Payer: Self-pay | Attending: Emergency Medicine | Admitting: Emergency Medicine

## 2013-07-25 ENCOUNTER — Emergency Department (HOSPITAL_COMMUNITY): Payer: Self-pay

## 2013-07-25 DIAGNOSIS — Z7982 Long term (current) use of aspirin: Secondary | ICD-10-CM | POA: Insufficient documentation

## 2013-07-25 DIAGNOSIS — F17200 Nicotine dependence, unspecified, uncomplicated: Secondary | ICD-10-CM | POA: Insufficient documentation

## 2013-07-25 DIAGNOSIS — Z8739 Personal history of other diseases of the musculoskeletal system and connective tissue: Secondary | ICD-10-CM | POA: Insufficient documentation

## 2013-07-25 DIAGNOSIS — J3489 Other specified disorders of nose and nasal sinuses: Secondary | ICD-10-CM | POA: Insufficient documentation

## 2013-07-25 DIAGNOSIS — J069 Acute upper respiratory infection, unspecified: Secondary | ICD-10-CM | POA: Insufficient documentation

## 2013-07-25 DIAGNOSIS — Z9889 Other specified postprocedural states: Secondary | ICD-10-CM | POA: Insufficient documentation

## 2013-07-25 DIAGNOSIS — H9209 Otalgia, unspecified ear: Secondary | ICD-10-CM | POA: Insufficient documentation

## 2013-07-25 DIAGNOSIS — J029 Acute pharyngitis, unspecified: Secondary | ICD-10-CM | POA: Insufficient documentation

## 2013-07-25 DIAGNOSIS — Z791 Long term (current) use of non-steroidal anti-inflammatories (NSAID): Secondary | ICD-10-CM | POA: Insufficient documentation

## 2013-07-25 DIAGNOSIS — R509 Fever, unspecified: Secondary | ICD-10-CM | POA: Insufficient documentation

## 2013-07-25 DIAGNOSIS — J00 Acute nasopharyngitis [common cold]: Secondary | ICD-10-CM | POA: Insufficient documentation

## 2013-07-25 HISTORY — DX: Lateral epicondylitis, unspecified elbow: M77.10

## 2013-07-25 MED ORDER — GUAIFENESIN 100 MG/5ML PO SYRP: 100.0000 mg | ORAL_SOLUTION | ORAL | Status: DC | PRN

## 2013-07-25 NOTE — ED Provider Notes (Signed)
CSN: [id number]     Arrival date & time 07/25/13  1422 History    This chart was scribed for Ezequiel Essex, MD, by Neta Ehlers, ED Scribe. This patient was seen in room APFT20/APFT20 and the patient's care was started at 2:34 PM.   First MD Initiated Contact with Patient 07/25/13 1433     Chief Complaint  Patient presents with  . Cough  . Nasal Congestion    The history is provided by the patient. No language interpreter was used.   HPI Comments: Alicia Flowers is a 42 y.o. female who presents to the Emergency Department complaining of cold symptoms. She has experienced achiness, a nonproductive cough, rhinorrhea, left ear pain, sore throat which she attributes to coughing, fever, chills, and soreness to her shoulders. She denies any appetite changes, nausea, emesis, pain to her sinuses, and abdominal pain. She also denies any recent sick contacts or any recent travels. The pt has treated her symptoms with an Advil congestion, but with little resolution.   Past Medical History  Diagnosis Date  . Tennis elbow    Past Surgical History  Procedure Laterality Date  . Appendectomy    . Tubal ligation     No family history on file. History  Substance Use Topics  . Smoking status: Current Every Day Smoker    Types: Cigarettes  . Smokeless tobacco: Not on file  . Alcohol Use: Yes     Comment: socially   No OB history provided.  Review of Systems  A complete 10 system review of systems was obtained, and all systems were negative except where indicated in the HPI and PE.   Allergies  Review of patient's allergies indicates no known allergies.  Home Medications   Current Outpatient Rx  Name  Route  Sig  Dispense  Refill  . aspirin 325 MG tablet   Oral   Take 325 mg by mouth daily.         . naphazoline-glycerin (CLEAR EYES) 0.012-0.2 % SOLN   Both Eyes   Place 1-2 drops into both eyes every 4 (four) hours as needed. Redness         . naproxen sodium (ALEVE) 220  MG tablet   Oral   Take 880 mg by mouth 2 (two) times daily as needed. Pain          Triage Vitals: BP 144/88  Pulse 68  Temp(Src) 98.2 F (36.8 C) (Oral)  Resp 16  Ht 5\' 3"  (1.6 m)  Wt 160 lb (72.576 kg)  BMI 28.35 kg/m2  SpO2 100%  LMP 06/25/2013  Physical Exam  Nursing note and vitals reviewed. Constitutional: She appears well-developed and well-nourished. No distress.  HENT:  Head: Normocephalic and atraumatic.  Mouth/Throat: Oropharynx is clear and moist. No oropharyngeal exudate.  Boggy nasal turbinates. No sinus tenderness. TM normal. Throat normal.    Eyes: Conjunctivae and EOM are normal. Pupils are equal, round, and reactive to light. Right eye exhibits no discharge. Left eye exhibits no discharge. No scleral icterus.  Neck: Normal range of motion. Neck supple. No JVD present. No thyromegaly present.  Cardiovascular: Normal rate, regular rhythm, normal heart sounds and intact distal pulses.  Exam reveals no gallop and no friction rub.   No murmur heard. Pulmonary/Chest: Effort normal and breath sounds normal. No respiratory distress. She has no wheezes. She has no rales.  Lungs clear.   Abdominal: Soft. Bowel sounds are normal. She exhibits no distension and no mass. There is no  tenderness.  Musculoskeletal: Normal range of motion. She exhibits no edema and no tenderness.  Lymphadenopathy:    She has no cervical adenopathy.  Neurological: She is alert. Coordination normal.  Skin: Skin is warm and dry. No rash noted. No erythema.  Psychiatric: She has a normal mood and affect. Her behavior is normal.    ED Course   DIAGNOSTIC STUDIES: Oxygen Saturation is 100% on room air, normal by my interpretation.    COORDINATION OF CARE:  2:37 PM- Discussed treatment plan with patient, and the patient agreed to the plan.    Procedures (including critical care time)  Labs Reviewed - No data to display Dg Chest 2 View  07/25/2013   *RADIOLOGY REPORT*  Clinical Data:  Cough  CHEST - 2 VIEW  Comparison: December 22, 2007  Findings: Lungs clear.  The heart size and pulmonary vascularity are normal.  No adenopathy.  No bone lesions.  IMPRESSION: No abnormality noted.   Original Report Authenticated By: Lowella Grip, M.D.   No diagnosis found.  MDM  2 Days of nonproductive cough with body aches, congestion, chills. No chest pain or shortness of breath. No abdominal pain or vomiting. No fevers.  Patient appears well and nontoxic. Lungs clear. Chest x-ray negative. Supportive care for URI, return precautions discussed. No indication for antibiotics.   I personally performed the services described in this documentation, which was scribed in my presence. The recorded information has been reviewed and is accurate.    Ezequiel Essex, MD 07/25/13 1530

## 2013-07-25 NOTE — ED Notes (Signed)
Non productive cough with body aches and nasal congestion x 2 days.  Chills and hot flashes.

## 2014-02-11 ENCOUNTER — Emergency Department (HOSPITAL_COMMUNITY)
Admission: EM | Admit: 2014-02-11 | Discharge: 2014-02-11 | Disposition: A | Discharge status: Home / Self Care | Payer: Self-pay | Attending: Emergency Medicine | Admitting: Emergency Medicine

## 2014-02-11 ENCOUNTER — Encounter (HOSPITAL_COMMUNITY): Payer: Self-pay | Admitting: Emergency Medicine

## 2014-02-11 DIAGNOSIS — R197 Diarrhea, unspecified: Secondary | ICD-10-CM | POA: Insufficient documentation

## 2014-02-11 DIAGNOSIS — H109 Unspecified conjunctivitis: Secondary | ICD-10-CM | POA: Insufficient documentation

## 2014-02-11 DIAGNOSIS — Z7982 Long term (current) use of aspirin: Secondary | ICD-10-CM | POA: Insufficient documentation

## 2014-02-11 DIAGNOSIS — F17200 Nicotine dependence, unspecified, uncomplicated: Secondary | ICD-10-CM | POA: Insufficient documentation

## 2014-02-11 DIAGNOSIS — Z8739 Personal history of other diseases of the musculoskeletal system and connective tissue: Secondary | ICD-10-CM | POA: Insufficient documentation

## 2014-02-11 MED ORDER — TETRACAINE HCL 0.5 % OP SOLN
OPHTHALMIC | Status: AC
  Filled 2014-02-11: qty 2

## 2014-02-11 MED ORDER — DIPHENOXYLATE-ATROPINE 2.5-0.025 MG PO TABS
1.0000 | ORAL_TABLET | Freq: Four times a day (QID) | ORAL | Status: DC | PRN
Start: 2014-02-11 — End: 2019-09-28

## 2014-02-11 MED ORDER — FLUORESCEIN SODIUM 1 MG OP STRP
ORAL_STRIP | OPHTHALMIC | Status: AC
  Administered 2014-02-11: 11:00:00
  Filled 2014-02-11: qty 1

## 2014-02-11 MED ORDER — DIPHENOXYLATE-ATROPINE 2.5-0.025 MG PO TABS
2.0000 | ORAL_TABLET | Freq: Once | ORAL | Status: AC
  Administered 2014-02-11: 2 via ORAL
  Filled 2014-02-11: qty 2

## 2014-02-11 MED ORDER — TOBRAMYCIN 0.3 % OP SOLN
1.0000 [drp] | OPHTHALMIC | Status: DC
  Administered 2014-02-11: 1 [drp] via OPHTHALMIC
  Filled 2014-02-11: qty 5

## 2014-02-11 MED ORDER — KETOROLAC TROMETHAMINE 0.5 % OP SOLN
1.0000 [drp] | Freq: Four times a day (QID) | OPHTHALMIC | Status: DC
  Administered 2014-02-11: 1 [drp] via OPHTHALMIC
  Filled 2014-02-11: qty 5

## 2014-02-11 MED ORDER — TETRACAINE HCL 0.5 % OP SOLN
1.0000 [drp] | Freq: Once | OPHTHALMIC | Status: AC
  Administered 2014-02-11: 11:00:00 via OPHTHALMIC

## 2014-02-11 NOTE — ED Notes (Signed)
Pt c/o right eye soreness, redness, swelling and clear drainage x 2 days. Selling around right eye with some redness noted. Pt also c/o 2 bumps to left posterior/alteral upper arm that started 4 days ago that itches

## 2014-02-11 NOTE — Discharge Instructions (Signed)
Conjunctivitis Conjunctivitis is commonly called "pink eye." Conjunctivitis can be caused by bacterial or viral infection, allergies, or injuries. There is usually redness of the lining of the eye, itching, discomfort, and sometimes discharge. There may be deposits of matter along the eyelids. A viral infection usually causes a watery discharge, while a bacterial infection causes a yellowish, thick discharge. Pink eye is very contagious and spreads by direct contact. You may be given antibiotic eyedrops as part of your treatment. Before using your eye medicine, remove all drainage from the eye by washing gently with warm water and cotton balls. Continue to use the medication until you have awakened 2 mornings in a row without discharge from the eye. Do not rub your eye. This increases the irritation and helps spread infection. Use separate towels from other household members. Wash your hands with soap and water before and after touching your eyes. Use cold compresses to reduce pain and sunglasses to relieve irritation from light. Do not wear contact lenses or wear eye makeup until the infection is gone. SEEK MEDICAL CARE IF:   Your symptoms are not better after 3 days of treatment.  You have increased pain or trouble seeing.  The outer eyelids become very red or swollen. Document Released: 01/21/2005 Document Revised: 03/07/2012 Document Reviewed: 12/14/2005 [facility] Patient Information 2014 [place].  Diarrhea Diarrhea is watery poop (stool). It can make you feel weak, tired, thirsty, or give you a dry mouth (signs of dehydration). Watery poop is a sign of another problem, most often an infection. It often lasts 2 3 days. It can last longer if it is a sign of something serious. Take care of yourself as told by your doctor. HOME CARE   Drink 1 cup (8 ounces) of fluid each time you have watery poop.  Do not drink the following fluids:  Those that contain simple sugars (fructose, glucose,  galactose, lactose, sucrose, maltose).  Sports drinks.  Fruit juices.  Whole milk products.  Sodas.  Drinks with caffeine (coffee, tea, soda) or alcohol.  Oral rehydration solution may be used if the doctor says it is okay. You may make your own solution. Follow this recipe:    teaspoon table salt.   teaspoon baking soda.   teaspoon salt substitute containing potassium chloride.  1 tablespoons sugar.  1 liter (34 ounces) of water.  Avoid the following foods:  High fiber foods, such as raw fruits and vegetables.  Nuts, seeds, and whole grain breads and cereals.   Those that are sweetened with sugar alcohols (xylitol, sorbitol, mannitol).  Try eating the following foods:  Starchy foods, such as rice, toast, pasta, low-sugar cereal, oatmeal, baked potatoes, crackers, and bagels.  Bananas.  Applesauce.  Eat probiotic-rich foods, such as yogurt and milk products that are fermented.  Wash your hands well after each time you have watery poop.  Only take medicine as told by your doctor.  Take a warm bath to help lessen burning or pain from having watery poop. GET HELP RIGHT AWAY IF:   You cannot drink fluids without throwing up (vomiting).  You keep throwing up.  You have blood in your poop, or your poop looks black and tarry.  You do not pee (urinate) in 6 8 hours, or there is only a small amount of very dark pee.  You have belly (abdominal) pain that gets worse or stays in the same spot (localizes).  You are weak, dizzy, confused, or lightheaded.  You have a very bad headache.  Your watery poop gets worse or does not get better.  You have a fever or lasting symptoms for more than 2 3 days.  You have a fever and your symptoms suddenly get worse. MAKE SURE YOU:   Understand these instructions.  Will watch your condition.  Will get help right away if you are not doing well or get worse. Document Released: 06/01/2008 Document Revised: 09/07/2012  Document Reviewed: 08/21/2012 [facility] Patient Information 2014 [place], [place].

## 2014-02-11 NOTE — ED Provider Notes (Signed)
CSN: [id number]     Arrival date & time 02/11/14  0844 History   First MD Initiated Contact with Patient 02/11/14 0901     Chief Complaint  Patient presents with  . Eye Pain     (Consider location/radiation/quality/duration/timing/severity/associated sxs/prior Treatment) HPI Comments: JAYLON STAPEL is a 43 y.o. Female presenting with a two-day history of right eye soreness, itching, redness and clear to yellow drainage, waking this morning with her right eye matted shut.  She has possibly been exposed to pink eye.  She denies visual changes, sensation of foreign body, fevers, chills, ear pain, nasal congestion or drainage.  She does mention to "bumps" on her left upper arm which she suspects or insect bites, started 4 days ago and have been itchy but are now resolving.  She denies any pain at the site.  Additionally she states that she has developed diarrhea since yesterday, describing 2-3 bowel movements per day which are soft, not watery and without mucus or blood.  She denies abdominal pain except for cramping prior to BMs, denies fevers, no nausea or vomiting.  No other family members have this symptom.     The history is provided by the patient.    Past Medical History  Diagnosis Date  . Tennis elbow    Past Surgical History  Procedure Laterality Date  . Appendectomy    . Tubal ligation     History reviewed. No pertinent family history. History  Substance Use Topics  . Smoking status: Current Every Day Smoker    Types: Cigarettes  . Smokeless tobacco: Not on file  . Alcohol Use: Yes     Comment: socially   OB History   Grav Para Term Preterm Abortions TAB SAB Ect Mult Living                 Review of Systems  Constitutional: Negative for fever and chills.  HENT: Negative for facial swelling.   Eyes: Positive for pain, discharge, redness and itching. Negative for visual disturbance.  Respiratory: Negative for shortness of breath and wheezing.   Skin: Positive for  wound.  Neurological: Negative for numbness.      Allergies  Review of patient's allergies indicates no known allergies.  Home Medications   Current Outpatient Rx  Name  Route  Sig  Dispense  Refill  . aspirin 325 MG tablet   Oral   Take 325 mg by mouth 3 (three) times a week.          . naphazoline-glycerin (CLEAR EYES) 0.012-0.2 % SOLN   Both Eyes   Place 1-2 drops into both eyes every 4 (four) hours as needed. Redness         . diphenoxylate-atropine (LOMOTIL) 2.5-0.025 MG per tablet   Oral   Take 1 tablet by mouth 4 (four) times daily as needed for diarrhea or loose stools.   30 tablet   0   . naproxen sodium (ALEVE) 220 MG tablet   Oral   Take 440 mg by mouth 2 (two) times daily as needed (tennis elbow). Pain          BP 141/78  Pulse 85  Temp(Src) 98.6 F (37 C) (Oral)  Resp 19  SpO2 100%  LMP 01/24/2014 Physical Exam  Nursing note and vitals reviewed. Constitutional: She appears well-developed and well-nourished.  HENT:  Head: Normocephalic and atraumatic.  Eyes: EOM are normal. Pupils are equal, round, and reactive to light. Right eye exhibits discharge. Right eye exhibits no  chemosis and no hordeolum. Right conjunctiva is injected. Right conjunctiva has no hemorrhage.  Visual Acuity - Bilateral Near: 20/20 ; Bilateral Distance: 20/20 ; R Near: 20/25 ; R Distance: 20/25 ; L Near: 20/20 ; L Distance: 20/20  Neck: Normal range of motion.  Cardiovascular: Normal rate, regular rhythm, normal heart sounds and intact distal pulses.   Pulmonary/Chest: Effort normal and breath sounds normal. She has no wheezes.  Abdominal: Soft. Bowel sounds are normal. There is no tenderness. There is no rebound and no guarding.  Musculoskeletal: Normal range of motion.  Neurological: She is alert.  Skin: Skin is warm and dry.  2 small macules left upper arm, no erythema, no red streaking.  Excoriations present.  No drainage.  Nontender.  Psychiatric: She has a normal mood  and affect.    ED Course  Procedures (including critical care time) Labs Review Labs Reviewed - No data to display Imaging Review No results found.  EKG Interpretation   None       MDM   Final diagnoses:  Conjunctivitis of right eye  Diarrhea    Patient with symptoms suggesting a viral conjunctivitis.  She will be covered with Tobrex solution and this was given to her.  She was also given ketorolac eyedrops for inflammation relief.  Advised to wash her hands frequently and avoid rubbing her face and eyes. She was given a one-time dose of Lomotil, prescription for additional Lomotil if needed.  Encouraged plenty of fluids, rest, discussed BRAT diet.  Patient  is without any abdominalpain or distress today.  Suspect mild viral enteritis.  Plan to followup for any worsened symptoms.    Evalee Jefferson, PA-C 02/12/14 1818

## 2014-02-12 NOTE — ED Provider Notes (Signed)
Medical screening examination/treatment/procedure(s) were performed by non-physician practitioner and as supervising physician I was immediately available for consultation/collaboration.  EKG Interpretation   None         Maudry Diego, MD 02/12/14 2337

## 2019-07-26 ENCOUNTER — Other Ambulatory Visit: Payer: Self-pay

## 2019-07-26 ENCOUNTER — Emergency Department (HOSPITAL_COMMUNITY)
Admission: EM | Admit: 2019-07-26 | Discharge: 2019-07-26 | Disposition: A | Discharge status: Home / Self Care | Payer: Self-pay | Attending: Emergency Medicine | Admitting: Emergency Medicine

## 2019-07-26 ENCOUNTER — Encounter (HOSPITAL_COMMUNITY): Payer: Self-pay | Admitting: *Deleted

## 2019-07-26 DIAGNOSIS — Z5321 Procedure and treatment not carried out due to patient leaving prior to being seen by health care provider: Secondary | ICD-10-CM | POA: Insufficient documentation

## 2019-07-26 DIAGNOSIS — R200 Anesthesia of skin: Secondary | ICD-10-CM | POA: Insufficient documentation

## 2019-07-26 NOTE — ED Notes (Signed)
Reported by registration that pt left at 1932

## 2019-07-26 NOTE — ED Triage Notes (Signed)
Pt with bilateral hand numbness for last 4-5 days, arms feel heavy first thing in the morning.  C/o pins and needles in her fingers.

## 2019-09-04 DIAGNOSIS — I639 Cerebral infarction, unspecified: Secondary | ICD-10-CM

## 2019-09-04 DIAGNOSIS — I10 Essential (primary) hypertension: Secondary | ICD-10-CM

## 2019-09-04 HISTORY — DX: Essential (primary) hypertension: I10

## 2019-09-04 HISTORY — DX: Cerebral infarction, unspecified: I63.9

## 2019-09-14 ENCOUNTER — Other Ambulatory Visit: Payer: Self-pay

## 2019-09-14 ENCOUNTER — Inpatient Hospital Stay (HOSPITAL_COMMUNITY): Payer: Medicaid Other

## 2019-09-14 ENCOUNTER — Emergency Department (HOSPITAL_COMMUNITY): Payer: Medicaid Other

## 2019-09-14 ENCOUNTER — Inpatient Hospital Stay (HOSPITAL_COMMUNITY)
Admission: EM | Admit: 2019-09-14 | Discharge: 2019-09-28 | DRG: 673 | Disposition: A | Discharge status: Home Health | Payer: Medicaid Other | Attending: Internal Medicine | Admitting: Internal Medicine

## 2019-09-14 ENCOUNTER — Encounter (HOSPITAL_COMMUNITY): Payer: Self-pay | Admitting: Emergency Medicine

## 2019-09-14 DIAGNOSIS — G459 Transient cerebral ischemic attack, unspecified: Secondary | ICD-10-CM

## 2019-09-14 DIAGNOSIS — Z794 Long term (current) use of insulin: Secondary | ICD-10-CM

## 2019-09-14 DIAGNOSIS — M609 Myositis, unspecified: Secondary | ICD-10-CM | POA: Diagnosis present

## 2019-09-14 DIAGNOSIS — J189 Pneumonia, unspecified organism: Secondary | ICD-10-CM | POA: Diagnosis present

## 2019-09-14 DIAGNOSIS — I4510 Unspecified right bundle-branch block: Secondary | ICD-10-CM | POA: Diagnosis present

## 2019-09-14 DIAGNOSIS — M6282 Rhabdomyolysis: Secondary | ICD-10-CM | POA: Diagnosis present

## 2019-09-14 DIAGNOSIS — N179 Acute kidney failure, unspecified: Secondary | ICD-10-CM | POA: Diagnosis present

## 2019-09-14 DIAGNOSIS — R0902 Hypoxemia: Secondary | ICD-10-CM

## 2019-09-14 DIAGNOSIS — R52 Pain, unspecified: Secondary | ICD-10-CM

## 2019-09-14 DIAGNOSIS — R946 Abnormal results of thyroid function studies: Secondary | ICD-10-CM | POA: Diagnosis present

## 2019-09-14 DIAGNOSIS — E875 Hyperkalemia: Secondary | ICD-10-CM

## 2019-09-14 DIAGNOSIS — E782 Mixed hyperlipidemia: Secondary | ICD-10-CM | POA: Diagnosis present

## 2019-09-14 DIAGNOSIS — R319 Hematuria, unspecified: Secondary | ICD-10-CM | POA: Diagnosis not present

## 2019-09-14 DIAGNOSIS — K7010 Alcoholic hepatitis without ascites: Secondary | ICD-10-CM | POA: Diagnosis present

## 2019-09-14 DIAGNOSIS — M7989 Other specified soft tissue disorders: Secondary | ICD-10-CM

## 2019-09-14 DIAGNOSIS — R7989 Other specified abnormal findings of blood chemistry: Secondary | ICD-10-CM | POA: Diagnosis present

## 2019-09-14 DIAGNOSIS — F17210 Nicotine dependence, cigarettes, uncomplicated: Secondary | ICD-10-CM | POA: Diagnosis present

## 2019-09-14 DIAGNOSIS — R739 Hyperglycemia, unspecified: Secondary | ICD-10-CM | POA: Diagnosis present

## 2019-09-14 DIAGNOSIS — I6389 Other cerebral infarction: Secondary | ICD-10-CM | POA: Diagnosis present

## 2019-09-14 DIAGNOSIS — Z716 Tobacco abuse counseling: Secondary | ICD-10-CM

## 2019-09-14 DIAGNOSIS — N186 End stage renal disease: Secondary | ICD-10-CM

## 2019-09-14 DIAGNOSIS — E872 Acidosis: Secondary | ICD-10-CM | POA: Diagnosis present

## 2019-09-14 DIAGNOSIS — R7401 Elevation of levels of liver transaminase levels: Secondary | ICD-10-CM | POA: Diagnosis present

## 2019-09-14 DIAGNOSIS — F1410 Cocaine abuse, uncomplicated: Secondary | ICD-10-CM | POA: Diagnosis present

## 2019-09-14 DIAGNOSIS — Z7151 Drug abuse counseling and surveillance of drug abuser: Secondary | ICD-10-CM

## 2019-09-14 DIAGNOSIS — F1010 Alcohol abuse, uncomplicated: Secondary | ICD-10-CM | POA: Diagnosis present

## 2019-09-14 DIAGNOSIS — R29898 Other symptoms and signs involving the musculoskeletal system: Secondary | ICD-10-CM

## 2019-09-14 DIAGNOSIS — D6489 Other specified anemias: Secondary | ICD-10-CM | POA: Diagnosis present

## 2019-09-14 DIAGNOSIS — Z8249 Family history of ischemic heart disease and other diseases of the circulatory system: Secondary | ICD-10-CM

## 2019-09-14 DIAGNOSIS — I708 Atherosclerosis of other arteries: Secondary | ICD-10-CM | POA: Diagnosis present

## 2019-09-14 DIAGNOSIS — Z79899 Other long term (current) drug therapy: Secondary | ICD-10-CM

## 2019-09-14 DIAGNOSIS — G9341 Metabolic encephalopathy: Secondary | ICD-10-CM | POA: Diagnosis present

## 2019-09-14 DIAGNOSIS — I639 Cerebral infarction, unspecified: Secondary | ICD-10-CM

## 2019-09-14 DIAGNOSIS — E039 Hypothyroidism, unspecified: Secondary | ICD-10-CM | POA: Diagnosis present

## 2019-09-14 DIAGNOSIS — F1290 Cannabis use, unspecified, uncomplicated: Secondary | ICD-10-CM | POA: Diagnosis present

## 2019-09-14 DIAGNOSIS — R7981 Abnormal blood-gas level: Secondary | ICD-10-CM

## 2019-09-14 DIAGNOSIS — R34 Anuria and oliguria: Secondary | ICD-10-CM | POA: Diagnosis present

## 2019-09-14 DIAGNOSIS — Z992 Dependence on renal dialysis: Secondary | ICD-10-CM | POA: Diagnosis not present

## 2019-09-14 DIAGNOSIS — Z7982 Long term (current) use of aspirin: Secondary | ICD-10-CM

## 2019-09-14 DIAGNOSIS — Z8673 Personal history of transient ischemic attack (TIA), and cerebral infarction without residual deficits: Secondary | ICD-10-CM

## 2019-09-14 DIAGNOSIS — Z7141 Alcohol abuse counseling and surveillance of alcoholic: Secondary | ICD-10-CM

## 2019-09-14 DIAGNOSIS — R945 Abnormal results of liver function studies: Secondary | ICD-10-CM

## 2019-09-14 DIAGNOSIS — Z20828 Contact with and (suspected) exposure to other viral communicable diseases: Secondary | ICD-10-CM | POA: Diagnosis present

## 2019-09-14 HISTORY — DX: Pneumonia, unspecified organism: J18.9

## 2019-09-14 LAB — CBC WITH DIFFERENTIAL/PLATELET
Abs Immature Granulocytes: 0.06 10*3/uL (ref 0.00–0.07)
Basophils Absolute: 0.1 10*3/uL (ref 0.0–0.1)
Basophils Relative: 0 %
Eosinophils Absolute: 0.1 10*3/uL (ref 0.0–0.5)
Eosinophils Relative: 1 %
HCT: 50.9 % — ABNORMAL HIGH (ref 36.0–46.0)
Hemoglobin: 15.7 g/dL — ABNORMAL HIGH (ref 12.0–15.0)
Immature Granulocytes: 0 %
Lymphocytes Relative: 4 %
Lymphs Abs: 0.6 10*3/uL — ABNORMAL LOW (ref 0.7–4.0)
MCH: 30.5 pg (ref 26.0–34.0)
MCHC: 30.8 g/dL (ref 30.0–36.0)
MCV: 98.8 fL (ref 80.0–100.0)
Monocytes Absolute: 1.4 10*3/uL — ABNORMAL HIGH (ref 0.1–1.0)
Monocytes Relative: 9 %
Neutro Abs: 12.2 10*3/uL — ABNORMAL HIGH (ref 1.7–7.7)
Neutrophils Relative %: 86 %
Platelets: 375 10*3/uL (ref 150–400)
RBC: 5.15 MIL/uL — ABNORMAL HIGH (ref 3.87–5.11)
RDW: 16 % — ABNORMAL HIGH (ref 11.5–15.5)
WBC: 14.4 10*3/uL — ABNORMAL HIGH (ref 4.0–10.5)
nRBC: 0 % (ref 0.0–0.2)

## 2019-09-14 LAB — URINALYSIS, ROUTINE W REFLEX MICROSCOPIC
Bilirubin Urine: NEGATIVE
Glucose, UA: 50 mg/dL — AB
Ketones, ur: NEGATIVE mg/dL
Leukocytes,Ua: NEGATIVE
Nitrite: NEGATIVE
Protein, ur: 100 mg/dL — AB
Specific Gravity, Urine: 1.017 (ref 1.005–1.030)
Squamous Epithelial / LPF: >50 — ABNORMAL HIGH (ref 0–5)
pH: 6 (ref 5.0–8.0)

## 2019-09-14 LAB — COMPREHENSIVE METABOLIC PANEL
ALT: 932 U/L — ABNORMAL HIGH (ref 0–44)
ALT: 959 U/L — ABNORMAL HIGH (ref 0–44)
ALT: 898 U/L — ABNORMAL HIGH (ref 0–44)
AST: 2087 U/L — ABNORMAL HIGH (ref 15–41)
AST: 2046 U/L — ABNORMAL HIGH (ref 15–41)
AST: 1780 U/L — ABNORMAL HIGH (ref 15–41)
Albumin: 3.7 g/dL (ref 3.5–5.0)
Albumin: 3.5 g/dL (ref 3.5–5.0)
Albumin: 3.3 g/dL — ABNORMAL LOW (ref 3.5–5.0)
Alkaline Phosphatase: 102 U/L (ref 38–126)
Alkaline Phosphatase: 108 U/L (ref 38–126)
Alkaline Phosphatase: 131 U/L — ABNORMAL HIGH (ref 38–126)
Anion gap: 17 — ABNORMAL HIGH (ref 5–15)
Anion gap: 15 (ref 5–15)
Anion gap: 15 (ref 5–15)
BUN: 48 mg/dL — ABNORMAL HIGH (ref 6–20)
BUN: 51 mg/dL — ABNORMAL HIGH (ref 6–20)
BUN: 56 mg/dL — ABNORMAL HIGH (ref 6–20)
CO2: 18 mmol/L — ABNORMAL LOW (ref 22–32)
CO2: 17 mmol/L — ABNORMAL LOW (ref 22–32)
CO2: 19 mmol/L — ABNORMAL LOW (ref 22–32)
Calcium: 6.5 mg/dL — ABNORMAL LOW (ref 8.9–10.3)
Calcium: 6.5 mg/dL — ABNORMAL LOW (ref 8.9–10.3)
Calcium: 6.1 mg/dL — CL (ref 8.9–10.3)
Chloride: 101 mmol/L (ref 98–111)
Chloride: 102 mmol/L (ref 98–111)
Chloride: 101 mmol/L (ref 98–111)
Creatinine, Ser: 4.77 mg/dL — ABNORMAL HIGH (ref 0.44–1.00)
Creatinine, Ser: 4.7 mg/dL — ABNORMAL HIGH (ref 0.44–1.00)
Creatinine, Ser: 4.97 mg/dL — ABNORMAL HIGH (ref 0.44–1.00)
GFR calc Af Amer: 12 mL/min — ABNORMAL LOW (ref >60)
GFR calc Af Amer: 12 mL/min — ABNORMAL LOW (ref >60)
GFR calc Af Amer: 11 mL/min — ABNORMAL LOW (ref >60)
GFR calc non Af Amer: 10 mL/min — ABNORMAL LOW (ref >60)
GFR calc non Af Amer: 10 mL/min — ABNORMAL LOW (ref >60)
GFR calc non Af Amer: 10 mL/min — ABNORMAL LOW (ref >60)
Glucose, Bld: 163 mg/dL — ABNORMAL HIGH (ref 70–99)
Glucose, Bld: 145 mg/dL — ABNORMAL HIGH (ref 70–99)
Glucose, Bld: 162 mg/dL — ABNORMAL HIGH (ref 70–99)
Potassium: 6.6 mmol/L (ref 3.5–5.1)
Potassium: 6.9 mmol/L (ref 3.5–5.1)
Potassium: 6.5 mmol/L (ref 3.5–5.1)
Sodium: 136 mmol/L (ref 135–145)
Sodium: 134 mmol/L — ABNORMAL LOW (ref 135–145)
Sodium: 135 mmol/L (ref 135–145)
Total Bilirubin: 0.3 mg/dL (ref 0.3–1.2)
Total Bilirubin: 0.4 mg/dL (ref 0.3–1.2)
Total Bilirubin: 0.3 mg/dL (ref 0.3–1.2)
Total Protein: 7.8 g/dL (ref 6.5–8.1)
Total Protein: 7.5 g/dL (ref 6.5–8.1)
Total Protein: 6.9 g/dL (ref 6.5–8.1)

## 2019-09-14 LAB — PROTIME-INR
INR: 1.1 (ref 0.8–1.2)
Prothrombin Time: 13.6 seconds (ref 11.4–15.2)

## 2019-09-14 LAB — PHOSPHORUS: Phosphorus: 10.5 mg/dL — ABNORMAL HIGH (ref 2.5–4.6)

## 2019-09-14 LAB — BLOOD GAS, VENOUS
Acid-base deficit: 9.9 mmol/L — ABNORMAL HIGH (ref 0.0–2.0)
Bicarbonate: 14.3 mmol/L — ABNORMAL LOW (ref 20.0–28.0)
FIO2: 32
O2 Saturation: 53.7 %
Patient temperature: 36.5
pCO2, Ven: 55.9 mmHg (ref 44.0–60.0)
pH, Ven: 7.13 — CL (ref 7.250–7.430)
pO2, Ven: 38.1 mmHg (ref 32.0–45.0)

## 2019-09-14 LAB — CBC
HCT: 49.1 % — ABNORMAL HIGH (ref 36.0–46.0)
Hemoglobin: 15.3 g/dL — ABNORMAL HIGH (ref 12.0–15.0)
MCH: 30.8 pg (ref 26.0–34.0)
MCHC: 31.2 g/dL (ref 30.0–36.0)
MCV: 99 fL (ref 80.0–100.0)
Platelets: 329 10*3/uL (ref 150–400)
RBC: 4.96 MIL/uL (ref 3.87–5.11)
RDW: 15.9 % — ABNORMAL HIGH (ref 11.5–15.5)
WBC: 13.5 10*3/uL — ABNORMAL HIGH (ref 4.0–10.5)
nRBC: 0.1 % (ref 0.0–0.2)

## 2019-09-14 LAB — LACTIC ACID, PLASMA: Lactic Acid, Venous: 2.4 mmol/L (ref 0.5–1.9)

## 2019-09-14 LAB — BRAIN NATRIURETIC PEPTIDE: B Natriuretic Peptide: 427 pg/mL — ABNORMAL HIGH (ref 0.0–100.0)

## 2019-09-14 LAB — LIPASE, BLOOD: Lipase: 27 U/L (ref 11–51)

## 2019-09-14 LAB — RAPID URINE DRUG SCREEN, HOSP PERFORMED
Amphetamines: NOT DETECTED
Barbiturates: NOT DETECTED
Benzodiazepines: NOT DETECTED
Cocaine: POSITIVE — AB
Opiates: NOT DETECTED
Tetrahydrocannabinol: POSITIVE — AB

## 2019-09-14 LAB — GLUCOSE, CAPILLARY
Glucose-Capillary: 163 mg/dL — ABNORMAL HIGH (ref 70–99)
Glucose-Capillary: 132 mg/dL — ABNORMAL HIGH (ref 70–99)

## 2019-09-14 LAB — TRIGLYCERIDES: Triglycerides: 177 mg/dL — ABNORMAL HIGH (ref <150)

## 2019-09-14 LAB — ETHANOL: Alcohol, Ethyl (B): <10 mg/dL (ref <10)

## 2019-09-14 LAB — ECHOCARDIOGRAM COMPLETE
Height: 63 in
Weight: 2758.4 oz

## 2019-09-14 LAB — VITAMIN B12: Vitamin B-12: 499 pg/mL (ref 180–914)

## 2019-09-14 LAB — SARS CORONAVIRUS 2 BY RT PCR (HOSPITAL ORDER, PERFORMED IN ~~LOC~~ HOSPITAL LAB): SARS Coronavirus 2: NEGATIVE

## 2019-09-14 LAB — URIC ACID: Uric Acid, Serum: 12 mg/dL — ABNORMAL HIGH (ref 2.5–7.1)

## 2019-09-14 LAB — MAGNESIUM
Magnesium: 2.5 mg/dL — ABNORMAL HIGH (ref 1.7–2.4)
Magnesium: 2.7 mg/dL — ABNORMAL HIGH (ref 1.7–2.4)

## 2019-09-14 LAB — TROPONIN I (HIGH SENSITIVITY)
Troponin I (High Sensitivity): 7286 ng/L (ref <18)
Troponin I (High Sensitivity): 8025 ng/L (ref <18)
Troponin I (High Sensitivity): 6579 ng/L (ref <18)

## 2019-09-14 LAB — CK: Total CK: >50000 U/L — ABNORMAL HIGH (ref 38–234)

## 2019-09-14 LAB — TSH: TSH: 7.473 u[IU]/mL — ABNORMAL HIGH (ref 0.350–4.500)

## 2019-09-14 LAB — AMMONIA: Ammonia: 27 umol/L (ref 9–35)

## 2019-09-14 MED ORDER — SODIUM CHLORIDE 0.9 % IV SOLN: 250.0000 mL | INTRAVENOUS | Status: DC | PRN

## 2019-09-14 MED ORDER — SODIUM CHLORIDE 0.9 % IV SOLN
1.0000 g | Freq: Once | INTRAVENOUS | Status: AC
  Administered 2019-09-14: 1 g via INTRAVENOUS
  Filled 2019-09-14: qty 10

## 2019-09-14 MED ORDER — FUROSEMIDE 10 MG/ML IJ SOLN
40.0000 mg | Freq: Once | INTRAMUSCULAR | Status: AC
  Administered 2019-09-14: 40 mg via INTRAVENOUS
  Filled 2019-09-14: qty 4

## 2019-09-14 MED ORDER — SODIUM CHLORIDE 0.9 % IV SOLN
500.0000 mg | INTRAVENOUS | Status: DC
  Administered 2019-09-14: 14:00:00 500 mg via INTRAVENOUS
  Filled 2019-09-14: qty 500

## 2019-09-14 MED ORDER — ONDANSETRON HCL 4 MG/2ML IJ SOLN
4.0000 mg | Freq: Four times a day (QID) | INTRAMUSCULAR | Status: DC | PRN
  Administered 2019-09-14: 4 mg via INTRAVENOUS
  Filled 2019-09-14: qty 2

## 2019-09-14 MED ORDER — SODIUM CHLORIDE 0.9 % IV SOLN
2.0000 g | INTRAVENOUS | Status: AC
  Administered 2019-09-15 – 2019-09-19 (×5): 2 g via INTRAVENOUS
  Filled 2019-09-14 (×5): qty 20

## 2019-09-14 MED ORDER — SODIUM BICARBONATE 8.4 % IV SOLN
INTRAVENOUS | Status: AC
  Filled 2019-09-14: qty 50

## 2019-09-14 MED ORDER — SODIUM CHLORIDE 0.9% FLUSH
3.0000 mL | Freq: Two times a day (BID) | INTRAVENOUS | Status: DC
  Administered 2019-09-15 – 2019-09-22 (×7): 3 mL via INTRAVENOUS

## 2019-09-14 MED ORDER — SODIUM POLYSTYRENE SULFONATE 15 GM/60ML PO SUSP
15.0000 g | Freq: Once | ORAL | Status: AC
  Administered 2019-09-14: 15 g via ORAL
  Filled 2019-09-14 (×2): qty 60

## 2019-09-14 MED ORDER — SODIUM CHLORIDE 0.9% FLUSH
3.0000 mL | INTRAVENOUS | Status: DC | PRN
  Administered 2019-09-22: 3 mL via INTRAVENOUS
  Filled 2019-09-14: qty 3

## 2019-09-14 MED ORDER — CALCIUM GLUCONATE-NACL 1-0.675 GM/50ML-% IV SOLN
1.0000 g | Freq: Once | INTRAVENOUS | Status: AC
  Administered 2019-09-14: 10:00:00 1000 mg via INTRAVENOUS
  Filled 2019-09-14: qty 50

## 2019-09-14 MED ORDER — INSULIN ASPART 100 UNIT/ML ~~LOC~~ SOLN
0.0000 [IU] | Freq: Every day | SUBCUTANEOUS | Status: DC
  Administered 2019-09-25: 2 [IU] via SUBCUTANEOUS

## 2019-09-14 MED ORDER — VITAMIN B-1 100 MG PO TABS
100.0000 mg | ORAL_TABLET | Freq: Every day | ORAL | Status: DC
  Administered 2019-09-14 – 2019-09-28 (×12): 100 mg via ORAL
  Filled 2019-09-14 (×13): qty 1

## 2019-09-14 MED ORDER — NICOTINE 14 MG/24HR TD PT24
14.0000 mg | MEDICATED_PATCH | Freq: Every day | TRANSDERMAL | Status: DC
  Administered 2019-09-15 – 2019-09-28 (×13): 14 mg via TRANSDERMAL
  Filled 2019-09-14 (×13): qty 1

## 2019-09-14 MED ORDER — SODIUM CHLORIDE 0.9% FLUSH
3.0000 mL | Freq: Two times a day (BID) | INTRAVENOUS | Status: DC
  Administered 2019-09-15: 3 mL via INTRAVENOUS

## 2019-09-14 MED ORDER — ASPIRIN EC 81 MG PO TBEC
81.0000 mg | DELAYED_RELEASE_TABLET | Freq: Every day | ORAL | Status: DC
  Administered 2019-09-14 – 2019-09-28 (×12): 81 mg via ORAL
  Filled 2019-09-14 (×15): qty 1

## 2019-09-14 MED ORDER — INSULIN ASPART 100 UNIT/ML ~~LOC~~ SOLN
0.0000 [IU] | Freq: Three times a day (TID) | SUBCUTANEOUS | Status: DC
  Administered 2019-09-14: 2 [IU] via SUBCUTANEOUS
  Administered 2019-09-15 – 2019-09-20 (×9): 1 [IU] via SUBCUTANEOUS
  Administered 2019-09-22: 17:00:00 2 [IU] via SUBCUTANEOUS
  Administered 2019-09-22 – 2019-09-23 (×2): 1 [IU] via SUBCUTANEOUS
  Administered 2019-09-23 – 2019-09-24 (×3): 2 [IU] via SUBCUTANEOUS
  Administered 2019-09-25: 1 [IU] via SUBCUTANEOUS
  Administered 2019-09-25: 2 [IU] via SUBCUTANEOUS
  Administered 2019-09-26 – 2019-09-27 (×3): 1 [IU] via SUBCUTANEOUS
  Administered 2019-09-27: 17:00:00 2 [IU] via SUBCUTANEOUS

## 2019-09-14 MED ORDER — SODIUM BICARBONATE 8.4 % IV SOLN
50.0000 meq | Freq: Once | INTRAVENOUS | Status: AC
  Administered 2019-09-14: 12:00:00 50 meq via INTRAVENOUS

## 2019-09-14 MED ORDER — SODIUM CHLORIDE 0.9% FLUSH: 3.0000 mL | INTRAVENOUS | Status: DC | PRN

## 2019-09-14 MED ORDER — SODIUM CHLORIDE 0.9 % IV SOLN: 2.0000 g | INTRAVENOUS | Status: DC

## 2019-09-14 MED ORDER — INSULIN ASPART 100 UNIT/ML IV SOLN
6.0000 [IU] | Freq: Once | INTRAVENOUS | Status: AC
  Administered 2019-09-14: 6 [IU] via INTRAVENOUS

## 2019-09-14 MED ORDER — STERILE WATER FOR INJECTION IV SOLN
INTRAVENOUS | Status: DC
  Administered 2019-09-14: 16:00:00 via INTRAVENOUS
  Filled 2019-09-14 (×3): qty 850

## 2019-09-14 MED ORDER — TRAMADOL HCL 50 MG PO TABS
50.0000 mg | ORAL_TABLET | Freq: Three times a day (TID) | ORAL | Status: DC | PRN
  Administered 2019-09-14 – 2019-09-28 (×24): 50 mg via ORAL
  Filled 2019-09-14 (×25): qty 1

## 2019-09-14 MED ORDER — PANTOPRAZOLE SODIUM 40 MG PO TBEC
40.0000 mg | DELAYED_RELEASE_TABLET | Freq: Every day | ORAL | Status: DC
  Administered 2019-09-14 – 2019-09-28 (×12): 40 mg via ORAL
  Filled 2019-09-14 (×14): qty 1

## 2019-09-14 MED ORDER — THIAMINE HCL 100 MG/ML IJ SOLN
100.0000 mg | Freq: Every day | INTRAMUSCULAR | Status: DC
  Administered 2019-09-19 – 2019-09-20 (×2): 100 mg via INTRAVENOUS
  Filled 2019-09-14 (×4): qty 2

## 2019-09-14 MED ORDER — DEXTROSE 50 % IV SOLN: 1.0000 | Freq: Once | INTRAVENOUS | Status: DC

## 2019-09-14 MED ORDER — FOLIC ACID 1 MG PO TABS
1.0000 mg | ORAL_TABLET | Freq: Every day | ORAL | Status: DC
  Administered 2019-09-14 – 2019-09-28 (×12): 1 mg via ORAL
  Filled 2019-09-14 (×13): qty 1

## 2019-09-14 MED ORDER — VITAMIN B-1 100 MG PO TABS
100.0000 mg | ORAL_TABLET | Freq: Once | ORAL | Status: AC
  Administered 2019-09-14: 100 mg via ORAL
  Filled 2019-09-14: qty 1

## 2019-09-14 MED ORDER — SODIUM CHLORIDE 0.9 % IV BOLUS
500.0000 mL | Freq: Once | INTRAVENOUS | Status: AC
  Administered 2019-09-14: 10:00:00 500 mL via INTRAVENOUS

## 2019-09-14 MED ORDER — DEXTROSE 50 % IV SOLN
INTRAVENOUS | Status: AC
  Administered 2019-09-14: 50 mL via INTRAVENOUS
  Filled 2019-09-14: qty 50

## 2019-09-14 MED ORDER — INSULIN ASPART 100 UNIT/ML IV SOLN: 10.0000 [IU] | Freq: Once | INTRAVENOUS | Status: DC

## 2019-09-14 MED ORDER — DEXTROSE 50 % IV SOLN
50.0000 mL | Freq: Once | INTRAVENOUS | Status: AC
  Administered 2019-09-14: 10:00:00 50 mL via INTRAVENOUS

## 2019-09-14 MED ORDER — STERILE WATER FOR INJECTION IV SOLN
INTRAVENOUS | Status: DC
  Administered 2019-09-14 – 2019-09-15 (×2): via INTRAVENOUS
  Filled 2019-09-14 (×10): qty 850

## 2019-09-14 MED ORDER — SODIUM ZIRCONIUM CYCLOSILICATE 10 G PO PACK
10.0000 g | PACK | Freq: Every day | ORAL | Status: DC
  Administered 2019-09-14 – 2019-09-16 (×3): 10 g via ORAL
  Filled 2019-09-14 (×4): qty 1

## 2019-09-14 MED ORDER — SODIUM CHLORIDE 0.9 % IV SOLN
100.0000 mg | Freq: Once | INTRAVENOUS | Status: AC
  Administered 2019-09-14: 100 mg via INTRAVENOUS
  Filled 2019-09-14 (×2): qty 100

## 2019-09-14 MED ORDER — ADULT MULTIVITAMIN W/MINERALS CH
1.0000 | ORAL_TABLET | Freq: Every day | ORAL | Status: DC
  Administered 2019-09-14 – 2019-09-28 (×12): 1 via ORAL
  Filled 2019-09-14 (×13): qty 1

## 2019-09-14 MED ORDER — LORAZEPAM 1 MG PO TABS: 1.0000 mg | ORAL_TABLET | ORAL | Status: AC | PRN

## 2019-09-14 MED ORDER — LORAZEPAM 2 MG/ML IJ SOLN
1.0000 mg | INTRAMUSCULAR | Status: AC | PRN
  Administered 2019-09-14: 2 mg via INTRAVENOUS
  Administered 2019-09-15: 1 mg via INTRAVENOUS
  Administered 2019-09-15: 21:00:00 2 mg via INTRAVENOUS
  Administered 2019-09-15: 1 mg via INTRAVENOUS
  Administered 2019-09-16 (×2): 4 mg via INTRAVENOUS
  Administered 2019-09-17: 2 mg via INTRAVENOUS
  Filled 2019-09-14: qty 2
  Filled 2019-09-14 (×3): qty 1
  Filled 2019-09-14: qty 2
  Filled 2019-09-14 (×3): qty 1

## 2019-09-14 MED ORDER — HEPARIN SODIUM (PORCINE) 5000 UNIT/ML IJ SOLN
5000.0000 [IU] | Freq: Three times a day (TID) | INTRAMUSCULAR | Status: AC
  Administered 2019-09-14 – 2019-09-18 (×11): 5000 [IU] via SUBCUTANEOUS
  Filled 2019-09-14 (×16): qty 1

## 2019-09-14 NOTE — ED Notes (Signed)
Pt increased to 3L of O2 per [place]

## 2019-09-14 NOTE — ED Notes (Signed)
CRITICAL VALUE ALERT  Critical Value:  K 6.9, trop 7286  Date & Time Notied:  09/14/2019, 1345  Provider Notified: Dr. Dyann Kief  Orders Received/Actions taken: no further orders at this time.

## 2019-09-14 NOTE — ED Notes (Signed)
ED Provider at bedside. 

## 2019-09-14 NOTE — H&P (Signed)
History and Physical    Alicia Flowers [id number] DOB: 1971/02/29 DOA: 09/14/2019  Referring MD/NP/PA: Dr. Reather Converse PCP: Patient, No Pcp Per  Patient coming from: Home  Chief Complaint: Left arm and left leg numbness; hypoxia.  HPI: Alicia Flowers is a 48 y.o. female with a medical history significant for tobacco abuse, alcohol abuse, cocaine abuse and marijuana use; who presented to the hospital secondary to worsening numbness in her left upper extremity and left leg.  Patient reports his symptoms have been present for approximately 3 months but worsening in the last 2 to 3 days prior to admission.  Patient also expressed feeling slightly more tired and having decrease oral intake.  No chest pain, no nausea, no vomiting, no productive cough, no sick contacts.  Patient reports to continue smoking about half pack per day and is drinking on daily basis.  Last use of marijuana was the night prior to admission.   In the ED patient was found to be mildly hypoxic, with elevated WBCs, acute renal failure with a creatinine close to 5, hyperkalemic, hypophosphatemic with a normal radiologic studies suggesting community-acquired pneumonia.  She also has a positive MRI demonstrating right occipital stroke.  Cultures were taken, sodium bicarbonate, insulin and calcium gluconate provided for hyperkalemia.  Patient is started on IV antibiotics and [facility] has been contacted to admit patient for further evaluation and management.  Past Medical/Surgical History: Past Medical History:  Diagnosis Date   Tennis elbow     Past Surgical History:  Procedure Laterality Date   APPENDECTOMY     TUBAL LIGATION      Social History:  reports that she has been smoking cigarettes. She has been smoking about 0.50 packs per day. She has never used smokeless tobacco. She reports current alcohol use. She reports current drug use. Drug: Marijuana.  Allergies: No Known Allergies  Family History: Patient reports a  history of hypertension otherwise noncontributory family history.  Prior to Admission medications   Medication Sig Start Date End Date Taking? Authorizing Provider  aspirin 325 MG tablet Take 325 mg by mouth 3 (three) times a week.    Yes [provider]  diphenoxylate-atropine (LOMOTIL) 2.5-0.025 MG per tablet Take 1 tablet by mouth 4 (four) times daily as needed for diarrhea or loose stools. 02/11/14  Yes Idol, Almyra Free, PA-C  naphazoline-glycerin (CLEAR EYES) 0.012-0.2 % SOLN Place 1-2 drops into both eyes every 4 (four) hours as needed. Redness   Yes [provider]  naproxen sodium (ALEVE) 220 MG tablet Take 440 mg by mouth 2 (two) times daily as needed (tennis elbow). Pain   Yes [provider]    Review of Systems:  Negative except as otherwise mentioned in HPI.  Physical Exam: Vitals:   09/14/19 1138 09/14/19 1300 09/14/19 1400 09/14/19 1510  BP:  135/87 (!) 146/87   Pulse: 88     Resp: (!) 8  11   Temp:      SpO2: 92%     Weight:    78.2 kg  Height:        Constitutional: Afebrile, somnolent but easily aroused.  Denies chest pain, no nausea, no vomiting. Eyes: PERRL, lids and conjunctivae normal, no icterus, no nystagmus. ENMT: Mucous membranes are moist. Posterior pharynx clear of any exudate or lesions.  Neck: normal, supple, no masses, no thyromegaly, no JVD. Respiratory: Positive rhonchi, mild tachypnea, no using accessory muscles.  Patient found to be hypoxic requiring 2 L nasal cannula supplementation.  Cardiovascular: Regular  rate and rhythm, no murmurs / rubs / gallops. No carotid bruits.  Abdomen: no tenderness, no masses palpated. No hepatosplenomegaly. Bowel sounds positive.  Musculoskeletal: No joint deformity upper and lower extremities. Good ROM, no contractures. Normal muscle tone.  Trace edema bilaterally appreciated on exam. Skin: no rashes, no petechiae, no open wounds. Neurologic: CN 2-12 grossly intact. Sensation intact, DTR  normal. Strength 5/5 in all 4.  Psychiatric: Normal judgment and insight. Alert and oriented x 3. Normal mood.    Labs on Admission: I have personally reviewed the following labs and imaging studies  CBC: Recent Labs  Lab 09/14/19 0819 09/14/19 1210  WBC 14.4* 13.5*  NEUTROABS 12.2*  --   HGB 15.7* 15.3*  HCT 50.9* 49.1*  MCV 98.8 99.0  PLT 375 [id number]   Basic Metabolic Panel: Recent Labs  Lab 09/14/19 0819 09/14/19 1153 09/14/19 1210  NA 136  --  134*  K 6.6*  --  6.9*  CL 101  --  102  CO2 18*  --  17*  GLUCOSE 163*  --  145*  BUN 48*  --  51*  CREATININE 4.77*  --  4.70*  CALCIUM 6.5*  --  6.5*  MG  --  2.5* 2.7*  PHOS  --   --  10.5*   GFR: Estimated Creatinine Clearance: 14.6 mL/min (A) (by C-G formula based on SCr of 4.7 mg/dL (H)).   Liver Function Tests: Recent Labs  Lab 09/14/19 0819 09/14/19 1210  AST 2,087* 2,046*  ALT 932* 959*  ALKPHOS 102 108  BILITOT 0.3 0.4  PROT 7.8 7.5  ALBUMIN 3.7 3.5   Recent Labs  Lab 09/14/19 1153  LIPASE 27   Recent Labs  Lab 09/14/19 1153  AMMONIA 27   Coagulation Profile: Recent Labs  Lab 09/14/19 0819  INR 1.1   CBG: Recent Labs  Lab 09/14/19 1615  GLUCAP 163*   Lipid Profile: Recent Labs    09/14/19 1153  TRIG 177*   Thyroid Function Tests: Recent Labs    09/14/19 1209  TSH 7.473*   Anemia Panel: Recent Labs    09/14/19 1209  VITAMINB12 499   Urine analysis:    Component Value Date/Time   COLORURINE AMBER (A) 09/14/2019 0841   APPEARANCEUR TURBID (A) 09/14/2019 0841   LABSPEC 1.017 09/14/2019 0841   PHURINE 6.0 09/14/2019 0841   GLUCOSEU 50 (A) 09/14/2019 0841   HGBUR LARGE (A) 09/14/2019 0841   BILIRUBINUR NEGATIVE 09/14/2019 [place] 09/14/2019 0841   PROTEINUR 100 (A) 09/14/2019 0841   UROBILINOGEN 1.0 10/22/2007 1645   NITRITE NEGATIVE 09/14/2019 0841   LEUKOCYTESUR NEGATIVE 09/14/2019 0841    Recent Results (from the past 240 hour(s))  SARS  Coronavirus 2 [facility] order, Performed in [facility] hospital lab) Nasopharyngeal Nasopharyngeal Swab     Status: None   Collection Time: 09/14/19 10:04 AM   Specimen: Nasopharyngeal Swab  Result Value Ref Range Status   SARS Coronavirus 2 NEGATIVE NEGATIVE Final    Comment: (NOTE) If result is NEGATIVE SARS-CoV-2 target nucleic acids are NOT DETECTED. The SARS-CoV-2 RNA is generally detectable in upper and lower  respiratory specimens during the acute phase of infection. The lowest  concentration of SARS-CoV-2 viral copies this assay can detect is 250  copies / mL. A negative result does not preclude SARS-CoV-2 infection  and should not be used as the sole basis for treatment or other  patient management decisions.  A negative result may occur with  improper specimen collection / handling, submission of specimen other  than nasopharyngeal swab, presence of viral mutation(s) within the  areas targeted by this assay, and inadequate number of viral copies  (<250 copies / mL). A negative result must be combined with clinical  observations, patient history, and epidemiological information. If result is POSITIVE SARS-CoV-2 target nucleic acids are DETECTED. The SARS-CoV-2 RNA is generally detectable in upper and lower  respiratory specimens dur ing the acute phase of infection.  Positive  results are indicative of active infection with SARS-CoV-2.  Clinical  correlation with patient history and other diagnostic information is  necessary to determine patient infection status.  Positive results do  not rule out bacterial infection or co-infection with other viruses. If result is PRESUMPTIVE POSTIVE SARS-CoV-2 nucleic acids MAY BE PRESENT.   A presumptive positive result was obtained on the submitted specimen  and confirmed on repeat testing.  While 2019 novel coronavirus  (SARS-CoV-2) nucleic acids may be present in the submitted sample  additional confirmatory testing may be necessary  for epidemiological  and / or clinical management purposes  to differentiate between  SARS-CoV-2 and other Sarbecovirus currently known to infect humans.  If clinically indicated additional testing with an alternate test  methodology [phone]) is advised. The SARS-CoV-2 RNA is generally  detectable in upper and lower respiratory sp ecimens during the acute  phase of infection. The expected result is Negative. Fact Sheet for Patients:  StrictlyIdeas.no Fact Sheet for Healthcare Providers: BankingDealers.co.za This test is not yet approved or cleared by the [place] FDA and has been authorized for detection and/or diagnosis of SARS-CoV-2 by FDA under an Emergency Use Authorization (EUA).  This EUA will remain in effect (meaning this test can be used) for the duration of the COVID-19 declaration under Section 564(b)(1) of the Act, 21 U.S.C. section 360bbb-3(b)(1), unless the authorization is terminated or revoked sooner. Performed at [facility], [address], [place], [place] [address]      Radiological Exams on Admission: Ct Abdomen Pelvis Wo Contrast  Result Date: 09/14/2019 CLINICAL DATA:  Abdominal distension with elevated liver enzymes EXAM: CT ABDOMEN AND PELVIS WITHOUT CONTRAST TECHNIQUE: Multidetector CT imaging of the abdomen and pelvis was performed following the standard protocol without oral or IV contrast. COMPARISON:  None. FINDINGS: Lower chest: There is airspace consolidation in both lower lobes posteriorly. There is a degree of lower lobe bronchiectatic change is well. Hepatobiliary: No focal liver lesions are evident on this noncontrast enhanced study. The gallbladder wall is not appreciably thickened. There is no biliary duct dilatation. Pancreas: There is no pancreatic mass or inflammatory focus. Spleen: No splenic lesions are evident. Adrenals/Urinary Tract: Adrenals bilaterally appear unremarkable. Kidneys bilaterally  show no evident mass or hydronephrosis on either side. There is no evident renal or ureteral calculus on either side. Urinary bladder is midline with wall thickness within normal limits for degree of distention. Stomach/Bowel: The stomach is diffusely distended with air. There is no appreciable bowel wall or mesenteric thickening. No obstructing focus evident. The terminal ileum appears unremarkable. Vascular/Lymphatic: There is no abdominal aortic aneurysm. There is aortic and iliac artery atherosclerotic calcification. There is no adenopathy evident in the abdomen or pelvis. Reproductive: Uterus is anteverted.  No pelvic mass is demonstrated. Other: Appendix absent. No periappendiceal region inflammation. No abscess or ascites is evident in the abdomen or pelvis. There is postoperative change in the anterior abdominal wall with several clips present. Musculoskeletal: There are foci of degenerative change in the lumbar  spine. 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 No sacroiliitis evident. No blastic or lytic bone lesions. No intramuscular lesions evident. IMPRESSION: 1. Airspace consolidation consistent with pneumonia in each posterior lower lobe region. There is a degree of bilateral lower lobe bronchiectasis as well. 2. Stomach distended with air. Gastric wall does not appear appreciably thickened. No small or large bowel distention evident. Question a degree of ileus from the pneumonia causing the gastric distention. 3. No evident bowel obstruction. No abscess in the abdomen or pelvis. Appendix absent. No periappendiceal region inflammatory change. 4.  Aortic and iliac artery atherosclerosis noted. 5. Osteitis condensans ilia bilaterally, a potential source for abdominal pain. Electronically Signed   By: Lowella Grip III M.D.   On: 09/14/2019 11:53   Mr Brain Wo Contrast  Result Date: 09/14/2019 CLINICAL DATA:  Numbness of the left arm over the last 3 months. EXAM: MRI HEAD WITHOUT CONTRAST  TECHNIQUE: Multiplanar, multiecho pulse sequences of the brain and surrounding structures were obtained without intravenous contrast. COMPARISON:  None. 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 Otherwise, the brain does not show any old or acute small or large vessel insult. No mass lesion, hemorrhage, hydrocephalus or extra-axial collection. Vascular: Major vessels at the base of the brain show flow. This includes the posterior circulation vessels. I do not see convincing evidence venous thrombosis. There is an arachnoid granulation at the right transverse sinus sigmoid sinus junction. 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 Otherwise negative study. Etiology indeterminate. There appears to be flow in the posterior circulation main vessels. I do not see clear evidence of venous thrombosis on this noncontrast study. One could consider CT or MR venography. Electronically Signed   By: Nelson Chimes M.D.   On: 09/14/2019 10:11   Mr Cervical Spine Wo Contrast  Result Date: 09/14/2019 CLINICAL DATA:  Left arm weakness for 3 months EXAM: MRI CERVICAL SPINE WITHOUT CONTRAST TECHNIQUE: Multiplanar, multisequence MR imaging of the cervical spine was performed. No intravenous contrast was administered. COMPARISON:  None. FINDINGS: The study is limited due to patient motion. Alignment: There is straightening of the normal cervical lordosis. Vertebrae: The vertebral body heights are well maintained. No fracture, marrow edema,or pathologic marrow infiltration. Cord: Appears to be normal in signal intensity, however limited due to patient motion. Posterior Fossa, vertebral arteries, paraspinal tissues: The visualized portion of the posterior fossa is unremarkable. Normal flow voids seen within the vertebral arteries. The paraspinal soft tissues are unremarkable. Disc  levels: C1-C2: Atlanto-axial junction is normal, without canal narrowing C2-C3: No significant spinal canal or neural foraminal narrowing C3-C4: There is a minimal disc osteophyte complex, however no significant canal or neural foraminal narrowing. C4-C5: There is a disc osteophyte complex and uncovertebral osteophytes which causes mild left and mild-to-moderate right neural foraminal narrowing. C5-C6: There is a disc osteophyte complex which causes mild bilateral neural foraminal narrowing. C6-C7: Disc osteophyte complex which causes mild right neural foraminal narrowing. C7-T1: No significant spinal canal or neural foraminal narrowing IMPRESSION: The study is limited due to patient motion. Mild lumbar spine spondylosis as described above this is most notable C4-C5 with mild-to-moderate right neural foraminal narrowing. Electronically Signed   By: Prudencio Pair M.D.   On: 09/14/2019 10:02   US Renal  Result Date: 09/14/2019 CLINICAL DATA:  Acute kidney injury. EXAM: RENAL / URINARY TRACT ULTRASOUND COMPLETE COMPARISON:  None. FINDINGS: Right Kidney:  Renal measurements: 11.9 x 4.8 x 4.8 cm = volume: 149 mL. Minimally increased echogenicity of renal parenchyma is noted. No mass or hydronephrosis visualized. Left Kidney: Renal measurements: 12.4 x 5.4 x 4.8 cm = volume: 171 mL. Minimally increased echogenicity of renal parenchyma is noted. No mass or hydronephrosis visualized. Bladder: Not visualized due to body habitus. IMPRESSION: Minimally increased echogenicity of renal parenchyma is noted bilaterally suggesting medical renal disease. No hydronephrosis or renal obstruction is noted. Bladder is not visualized. Electronically Signed   By: Marijo Conception M.D.   On: 09/14/2019 13:28   US Carotid Bilateral  Result Date: 09/14/2019 CLINICAL DATA:  48 year old female with a history of TIA EXAM: BILATERAL CAROTID DUPLEX ULTRASOUND TECHNIQUE: Pearline Cables scale imaging, color Doppler and duplex ultrasound were performed of  bilateral carotid and vertebral arteries in the neck. COMPARISON:  None. FINDINGS: Criteria: Quantification of carotid stenosis is based on velocity parameters that correlate the residual internal carotid diameter with NASCET-based stenosis levels, using the diameter of the distal internal carotid lumen as the denominator for stenosis measurement. The following velocity measurements were obtained: RIGHT ICA:  Systolic 123XX123 cm/sec, Diastolic 51 cm/sec CCA:  123XX123 cm/sec SYSTOLIC ICA/CCA RATIO:  1.8 ECA:  99 cm/sec LEFT ICA:  Systolic 99 cm/sec, Diastolic 52 cm/sec CCA:  123456 cm/sec SYSTOLIC ICA/CCA RATIO:  1.5 ECA: NA Right Brachial SBP: Not acquired Left Brachial SBP: Not acquired RIGHT CAROTID ARTERY: No significant calcified disease of the right common carotid artery. Intermediate waveform maintained. Heterogeneous plaque without significant calcifications at the right carotid bifurcation. Low resistance waveform of the right ICA. No significant tortuosity. RIGHT VERTEBRAL ARTERY: Antegrade flow with low resistance waveform. LEFT CAROTID ARTERY: No significant calcified disease of the left common carotid artery. Intermediate waveform maintained. Heterogeneous plaque at the left carotid bifurcation without significant calcifications. Low resistance waveform of the left ICA. LEFT VERTEBRAL ARTERY:  Antegrade flow with low resistance waveform. IMPRESSION: Color duplex indicates minimal heterogeneous plaque, with no hemodynamically significant stenosis by duplex criteria in the extracranial cerebrovascular circulation. Signed, Dulcy Fanny. Dellia Nims, RPVI [facility] Electronically Signed   By: Corrie Mckusick D.O.   On: 09/14/2019 15:28   Dg Chest Port 1 View  Result Date: 09/14/2019 CLINICAL DATA:  Hypoxia EXAM: PORTABLE CHEST 1 VIEW COMPARISON:  06/25/2013 FINDINGS: Cardiomegaly accentuated by low volumes. Diffuse interstitial prominence. No Kerley lines, air  bronchogram, effusion or pneumothorax. Gaseous distention of the stomach. IMPRESSION: 1. Cardiomegaly accentuated by low volumes. 2. Interstitial coarsening, favor vascular congestion. 3. Prominent gaseous distension of the stomach. Electronically Signed   By: Monte Fantasia M.D.   On: 09/14/2019 10:00    EKG: Independently reviewed.  No peak T waves, right bundle branch block and borderline QT prolongation.  Patient with poor R wave progressions and some Q waves in inferior leads.  Assessment/Plan 1-hypoxia/CAP (community acquired pneumonia) -Radiologic images demonstrating bilateral lower lobe infiltrates, patient also with elevated WBCs and decrease oxygen saturation on room air. -Will start treatment for community-acquired pneumonia -Oxygen supplementation and flutter valve -Follow strep pneumo and Legionella antigen in urine -Coronavirus test negative -Follow clinical response -IV fluids given.  2-Acute renal failure (ARF) ([place]) -No recent blood work for appropriate comparison (last blood work in our records for over 10 years ago). -Patient reports intermittent use of NSAIDs as an outpatient -Decrease oral intake as not feeling good lately. -Creatinine close to 5 on presentation, along with hypocalcemia, hyperkalemia and hyperphosphatemia. -Nephrology has been consulted  and will follow recommendations. -Will avoid/minimize nephrotoxic agents -Follow renal function trend -Started on IV fluid resuscitation -Urinalysis with positive blood in urine but negative leukocyte esterase and nitrite. -Follow urine cultures; will receive empirical coverage from antibiotics given for PNA.  3-hx of Alcohol abuse -no active withdrawal appreciated currently -will give thiamine and folic acid -monitor on CIWA protocol  4-left upper extremity and left lower extremity numbness: -Symptoms has been present for over 3 months and worse in the last 2-3 days. -MRI demonstrated an occipital  stroke -Will complete a stroke work-up -Neurology has been consulted -Patient denies dysarthria or difficulty swallowing. -will check lipid panel, A1c and patient will be started on baby aspirin. -Allow for permissive hypertension.  5-Cocaine abuse ([place]) -Cessation counseling has been provided.  6-Hyperkalemia and hyperphosphatemia  -received sodium bicarbonate, insulin, calcium gluconate and kayexalate -will monitor on telemetry  -follow electrolytes trend and renal service recommendations.  7-Metabolic acidosis -Most likely associated with acute renal failure -Patient has been started on sodium bicarbonate -Follow electrolytes trend and renal service recommendations.  8-elevated troponin -Patient denies chest pain -Discussed with cardiology service (Dr. Bronson Ing), will cycle troponin, repeat EKG and check 2D echo -Unable to use beta blockers in the setting of acute cocaine usage. -Baby aspirin has been started. -No anticipation of cardiac cath currently given abnormal renal function. -Complete medical stratification.  9-hyperglycemia without prior history diabetes -Will check A1c -Patient started on sliding scale insulin. -Follow CBGs trend.  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 -Based on improvement/clinical response will consider GI service involvement.  11-tobacco abuse -Cessation counseling has been provided -Currently no wheezing -Nicotine patch has been ordered.  DVT prophylaxis: Heparin. Code Status: Full code Family Communication: No family at bedside. Disposition Plan: To be determined; but hopefully discharge back home once medically stable. Consults called: Nephrology Admission status: Inpatient, length of stay more than 2 midnights, telemetry bed.   Time Spent: 70 minutes.  Barton Dubois MD [facility] Pager  [phone]   09/14/2019, 5:44 PM

## 2019-09-14 NOTE — Progress Notes (Signed)
CRITICAL VALUE ALERT  Critical Value:  K  6.5,   Ca  6.1  Date & Time Notied:  09/14/2019   1900  Provider Notified: Dr.Madera  Orders Received/Actions taken: None

## 2019-09-14 NOTE — ED Notes (Signed)
Pt placed on 2 liters [place]

## 2019-09-14 NOTE — Progress Notes (Signed)
CRITICAL VALUE ALERT  Critical Value:  Troponin 8025  Date & Time Notied: 09/14/19, 1945  Provider Notified: [place]  Orders Received/Actions taken:

## 2019-09-14 NOTE — ED Notes (Signed)
CRITICAL VALUE ALERT  Critical Value:  Lactic 2.4  Date & Time Notied:  09/14/2019, 1342  Provider Notified: Dr. Dyann Kief  Orders Received/Actions taken: see chart

## 2019-09-14 NOTE — Discharge Instructions (Signed)
Return for worsening weakness, fevers, or new concerns.  Is very important for you to follow-up with a primary doctor to coordinate your care.

## 2019-09-14 NOTE — ED Notes (Signed)
Pt reports to RN that she drinks about a pint of tequila every night. Last drank last night. Pt also reports marijuana use, last used this morning. Pt reports she got her marijuana from her usual person and not someone different.

## 2019-09-14 NOTE — Consult Note (Signed)
Reason for Consult: AKI Referring Physician: Dyann Kief, MD  Alicia Flowers is an 48 y.o. female.  HPI: Alicia Flowers is a 48 yo AAF without significant past medical history who was brought to [facility] ED by her friends after she was noted to have ankle edema, lethargy, and was unable to mover her left arm.  Apparently she has had worsening numbness and weakness of that arm for almost 3 months but has not sought out medical attention.  She also admits to drinking alcohol daily and started yesterday afternoon when she got home from work.  She reports that a friend came by later and that she took 2 snorts of cocaine around midnight.  She denies any loss of consciousness, trauma/falls, dysuria, pyuria, hematuria, urgency, frequency, or retention.  She also denies any history of kidney disease.  She also admitted to smoking marijuana on a regular basis.    In the ED she was also noted to have hyperkalemia, metabolic acidosis, elevated AST/ALT, and UDS + for cocaine and THC.  CT scan of abd/pelvis with airspace consilidation consistent with lower lobe pneumonia and bilateral bronchiectasis.  No obstruction of kidneys.  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  We were consulted to further evaluate and manage her AKI and electrolyte abnormalities.  Trend in Creatinine: Creatinine, Ser  Date/Time Value Ref Range Status  09/14/2019 12:10 PM 4.70 (H) 0.44 - 1.00 mg/dL Final  09/14/2019 08:19 AM 4.77 (H) 0.44 - 1.00 mg/dL Final  11/15/2008 10:03 AM 0.64  Final    PMH:   Past Medical History:  Diagnosis Date  . Tennis elbow     PSH:   Past Surgical History:  Procedure Laterality Date  . APPENDECTOMY    . TUBAL LIGATION      Allergies: No Known Allergies  Medications:   Prior to Admission medications   Medication Sig Start Date End Date Taking? Authorizing Provider  aspirin 325 MG tablet Take 325 mg by mouth 3 (three) times a week.    Yes [provider]  diphenoxylate-atropine (LOMOTIL) 2.5-0.025 MG per tablet Take 1 tablet by mouth 4 (four) times daily as needed for diarrhea or loose stools. 02/11/14  Yes Idol, Almyra Free, PA-C  naphazoline-glycerin (CLEAR EYES) 0.012-0.2 % SOLN Place 1-2 drops into both eyes every 4 (four) hours as needed. Redness   Yes [provider]  naproxen sodium (ALEVE) 220 MG tablet Take 440 mg by mouth 2 (two) times daily as needed (tennis elbow). Pain   Yes [provider]    Inpatient medications: . aspirin EC  81 mg Oral Daily  . folic acid  1 mg Oral Daily  . heparin injection (subcutaneous)  5,000 Units Subcutaneous Q8H  . insulin aspart  0-5 Units Subcutaneous QHS  . insulin aspart  0-9 Units Subcutaneous TID WC  . multivitamin with minerals  1 tablet Oral Daily  . pantoprazole  40 mg Oral Daily  . sodium zirconium cyclosilicate  10 g Oral Daily  . thiamine  100 mg Oral Daily   Or  . thiamine  100 mg Intravenous Daily    Discontinued Meds:   Medications Discontinued During This Encounter  Medication Reason  . insulin aspart (novoLOG) injection 10 Units   . dextrose 50 % solution 50 mL   . cefTRIAXone (ROCEPHIN) 2 g in sodium chloride 0.9 % 100 mL IVPB     Social History:  reports that she has been smoking cigarettes. She has  been smoking about 0.50 packs per day. She has never used smokeless tobacco. She reports current alcohol use. She reports current drug use. Drug: Marijuana.  Family History:  No family history on file.  Pertinent items are noted in HPI. Weight change:   Intake/Output Summary (Last 24 hours) at 09/14/2019 1613 Last data filed at 09/14/2019 1421 Gross per 24 hour  Intake 750.83 ml  Output -  Net 750.83 ml   BP (!) 146/87   Pulse 88   Temp 98.5 F (36.9 C)   Resp 11   Ht 5\' 3"  (1.6 m)   Wt 78.2 kg   LMP 09/07/2019   SpO2 92%   BMI 30.54 kg/m  Vitals:   09/14/19 1138 09/14/19 1300 09/14/19 1400 09/14/19 1510  BP:  135/87 (!) 146/87    Pulse: 88     Resp: (!) 8  11   Temp:      SpO2: 92%     Weight:    78.2 kg  Height:         General appearance: fatigued, no distress and slowed mentation Head: Normocephalic, without obvious abnormality, atraumatic Resp: diminished breath sounds bibasilar Cardio: regular rate and rhythm and no rub GI: soft, non-tender; bowel sounds normal; no masses,  no organomegaly Extremities: edema trace pedal edema and cyanosis of toes bilaterally and cool to touch.  Labs: Basic Metabolic Panel: Recent Labs  Lab 09/14/19 0819 09/14/19 1210  NA 136 134*  K 6.6* 6.9*  CL 101 102  CO2 18* 17*  GLUCOSE 163* 145*  BUN 48* 51*  CREATININE 4.77* 4.70*  ALBUMIN 3.7 3.5  CALCIUM 6.5* 6.5*  PHOS  --  10.5*   Liver Function Tests: Recent Labs  Lab 09/14/19 0819 09/14/19 1210  AST 2,087* 2,046*  ALT 932* 959*  ALKPHOS 102 108  BILITOT 0.3 0.4  PROT 7.8 7.5  ALBUMIN 3.7 3.5   Recent Labs  Lab 09/14/19 1153  LIPASE 27   Recent Labs  Lab 09/14/19 1153  AMMONIA 27   CBC: Recent Labs  Lab 09/14/19 0819 09/14/19 1210  WBC 14.4* 13.5*  NEUTROABS 12.2*  --   HGB 15.7* 15.3*  HCT 50.9* 49.1*  MCV 98.8 99.0  PLT 375 329   PT/INR: @LABRCNTIP (inr:5) Cardiac Enzymes: )No results for input(s): CKTOTAL, CKMB, CKMBINDEX, TROPONINI in the last 168 hours. CBG: No results for input(s): GLUCAP in the last 168 hours.  Iron Studies: No results for input(s): IRON, TIBC, TRANSFERRIN, FERRITIN in the last 168 hours.  Xrays/Other Studies: Ct Abdomen Pelvis Wo Contrast  Result Date: 09/14/2019 CLINICAL DATA:  Abdominal distension with elevated liver enzymes EXAM: CT ABDOMEN AND PELVIS WITHOUT CONTRAST TECHNIQUE: Multidetector CT imaging of the abdomen and pelvis was performed following the standard protocol without oral or IV contrast. COMPARISON:  None. FINDINGS: Lower chest: There is airspace consolidation in both lower lobes posteriorly. There is a degree of lower lobe bronchiectatic  change is well. Hepatobiliary: No focal liver lesions are evident on this noncontrast enhanced study. The gallbladder wall is not appreciably thickened. There is no biliary duct dilatation. Pancreas: There is no pancreatic mass or inflammatory focus. Spleen: No splenic lesions are evident. Adrenals/Urinary Tract: Adrenals bilaterally appear unremarkable. Kidneys bilaterally show no evident mass or hydronephrosis on either side. There is no evident renal or ureteral calculus on either side. Urinary bladder is midline with wall thickness within normal limits for degree of distention. Stomach/Bowel: The stomach is diffusely distended with air. There is no appreciable bowel  wall or mesenteric thickening. No obstructing focus evident. The terminal ileum appears unremarkable. Vascular/Lymphatic: There is no abdominal aortic aneurysm. There is aortic and iliac artery atherosclerotic calcification. There is no adenopathy evident in the abdomen or pelvis. Reproductive: Uterus is anteverted.  No pelvic mass is demonstrated. Other: Appendix absent. No periappendiceal region inflammation. No abscess or ascites is evident in the abdomen or pelvis. There is postoperative change in the anterior abdominal wall with several clips present. Musculoskeletal: There are foci of degenerative change in the lumbar spine. 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 No sacroiliitis evident. No blastic or lytic bone lesions. No intramuscular lesions evident. IMPRESSION: 1. Airspace consolidation consistent with pneumonia in each posterior lower lobe region. There is a degree of bilateral lower lobe bronchiectasis as well. 2. Stomach distended with air. Gastric wall does not appear appreciably thickened. No small or large bowel distention evident. Question a degree of ileus from the pneumonia causing the gastric distention. 3. No evident bowel obstruction. No abscess in the abdomen or pelvis. Appendix absent. No periappendiceal region  inflammatory change. 4.  Aortic and iliac artery atherosclerosis noted. 5. Osteitis condensans ilia bilaterally, a potential source for abdominal pain. Electronically Signed   By: Lowella Grip III M.D.   On: 09/14/2019 11:53   Mr Brain Wo Contrast  Result Date: 09/14/2019 CLINICAL DATA:  Numbness of the left arm over the last 3 months. EXAM: MRI HEAD WITHOUT CONTRAST TECHNIQUE: Multiplanar, multiecho pulse sequences of the brain and surrounding structures were obtained without intravenous contrast. COMPARISON:  None. 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 Otherwise, the brain does not show any old or acute small or large vessel insult. No mass lesion, hemorrhage, hydrocephalus or extra-axial collection. Vascular: Major vessels at the base of the brain show flow. This includes the posterior circulation vessels. I do not see convincing evidence venous thrombosis. There is an arachnoid granulation at the right transverse sinus sigmoid sinus junction. 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 Otherwise negative study. Etiology indeterminate. There appears to be flow in the posterior circulation main vessels. I do not see clear evidence of venous thrombosis on this noncontrast study. One could consider CT or MR venography. Electronically Signed   By: Nelson Chimes M.D.   On: 09/14/2019 10:11   Mr Cervical Spine Wo Contrast  Result Date: 09/14/2019 CLINICAL DATA:  Left arm weakness for 3 months EXAM: MRI CERVICAL SPINE WITHOUT CONTRAST TECHNIQUE: Multiplanar, multisequence MR imaging of the cervical spine was performed. No intravenous contrast was administered. COMPARISON:  None. FINDINGS: The study is limited due to patient motion. Alignment: There is straightening of the normal cervical lordosis. Vertebrae: The vertebral body heights are well  maintained. No fracture, marrow edema,or pathologic marrow infiltration. Cord: Appears to be normal in signal intensity, however limited due to patient motion. Posterior Fossa, vertebral arteries, paraspinal tissues: The visualized portion of the posterior fossa is unremarkable. Normal flow voids seen within the vertebral arteries. The paraspinal soft tissues are unremarkable. Disc levels: C1-C2: Atlanto-axial junction is normal, without canal narrowing C2-C3: No significant spinal canal or neural foraminal narrowing C3-C4: There is a minimal disc osteophyte complex, however no significant canal or neural foraminal narrowing. C4-C5: There is a disc osteophyte complex and uncovertebral osteophytes which causes mild left and mild-to-moderate right neural foraminal narrowing. C5-C6: There is a disc osteophyte complex which causes mild bilateral neural foraminal  narrowing. C6-C7: Disc osteophyte complex which causes mild right neural foraminal narrowing. C7-T1: No significant spinal canal or neural foraminal narrowing IMPRESSION: The study is limited due to patient motion. Mild lumbar spine spondylosis as described above this is most notable C4-C5 with mild-to-moderate right neural foraminal narrowing. Electronically Signed   By: Prudencio Pair M.D.   On: 09/14/2019 10:02   US Renal  Result Date: 09/14/2019 CLINICAL DATA:  Acute kidney injury. EXAM: RENAL / URINARY TRACT ULTRASOUND COMPLETE COMPARISON:  None. FINDINGS: Right Kidney: Renal measurements: 11.9 x 4.8 x 4.8 cm = volume: 149 mL. Minimally increased echogenicity of renal parenchyma is noted. No mass or hydronephrosis visualized. Left Kidney: Renal measurements: 12.4 x 5.4 x 4.8 cm = volume: 171 mL. Minimally increased echogenicity of renal parenchyma is noted. No mass or hydronephrosis visualized. Bladder: Not visualized due to body habitus. IMPRESSION: Minimally increased echogenicity of renal parenchyma is noted bilaterally suggesting medical renal  disease. No hydronephrosis or renal obstruction is noted. Bladder is not visualized. Electronically Signed   By: Marijo Conception M.D.   On: 09/14/2019 13:28   US Carotid Bilateral  Result Date: 09/14/2019 CLINICAL DATA:  49 year old female with a history of TIA EXAM: BILATERAL CAROTID DUPLEX ULTRASOUND TECHNIQUE: Pearline Cables scale imaging, color Doppler and duplex ultrasound were performed of bilateral carotid and vertebral arteries in the neck. COMPARISON:  None. FINDINGS: Criteria: Quantification of carotid stenosis is based on velocity parameters that correlate the residual internal carotid diameter with NASCET-based stenosis levels, using the diameter of the distal internal carotid lumen as the denominator for stenosis measurement. The following velocity measurements were obtained: RIGHT ICA:  Systolic 123XX123 cm/sec, Diastolic 51 cm/sec CCA:  123XX123 cm/sec SYSTOLIC ICA/CCA RATIO:  1.8 ECA:  99 cm/sec LEFT ICA:  Systolic 99 cm/sec, Diastolic 52 cm/sec CCA:  123456 cm/sec SYSTOLIC ICA/CCA RATIO:  1.5 ECA: NA Right Brachial SBP: Not acquired Left Brachial SBP: Not acquired RIGHT CAROTID ARTERY: No significant calcified disease of the right common carotid artery. Intermediate waveform maintained. Heterogeneous plaque without significant calcifications at the right carotid bifurcation. Low resistance waveform of the right ICA. No significant tortuosity. RIGHT VERTEBRAL ARTERY: Antegrade flow with low resistance waveform. LEFT CAROTID ARTERY: No significant calcified disease of the left common carotid artery. Intermediate waveform maintained. Heterogeneous plaque at the left carotid bifurcation without significant calcifications. Low resistance waveform of the left ICA. LEFT VERTEBRAL ARTERY:  Antegrade flow with low resistance waveform. IMPRESSION: Color duplex indicates minimal heterogeneous plaque, with no hemodynamically significant stenosis by duplex criteria in the extracranial cerebrovascular circulation. Signed, Dulcy Fanny.  Dellia Nims, RPVI [facility] Electronically Signed   By: Corrie Mckusick D.O.   On: 09/14/2019 15:28   Dg Chest Port 1 View  Result Date: 09/14/2019 CLINICAL DATA:  Hypoxia EXAM: PORTABLE CHEST 1 VIEW COMPARISON:  06/25/2013 FINDINGS: Cardiomegaly accentuated by low volumes. Diffuse interstitial prominence. No Kerley lines, air bronchogram, effusion or pneumothorax. Gaseous distention of the stomach. IMPRESSION: 1. Cardiomegaly accentuated by low volumes. 2. Interstitial coarsening, favor vascular congestion. 3. Prominent gaseous distension of the stomach. Electronically Signed   By: Monte Fantasia M.D.   On: 09/14/2019 10:00     Assessment/Plan: 1.  AKI- in setting of cocaine use, pneumonia, no obstruction.  Need to r/o acute GN (specifically ANCA vasculitis related to levamisole use with cocaine), also SLE workup with cyanosis of toes, and CK level to r/o rhabdo.  No emergent indication for dialysis, however I did discuss  with her and her primary svc that if she does not improve she may require HD. 2. 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 CAP/sepsis- on antibiotics per primary svc 4. Hyperkalemia- will start isotonic bicarb as well as lokelma.  EKG without peaked t-waves and will try to treat conservatively for now but may require HD.  Will also add IV lasix and follow K levels  5. Left arm weakness- possibly related to right cva, neuro to evaluate 6. Polysubstance abuse- SW consulted.  Will likely need withdrawal protocol as she admits to drinking daily 7. Abnormal EKG with new partial RBBB- ECHO and Cardiology evaluation pending.  Likely cocaine related.    Governor Rooks Teressa Mcglocklin 09/14/2019, 4:13 PM

## 2019-09-14 NOTE — ED Provider Notes (Signed)
[facility] EMERGENCY DEPARTMENT Provider Note   CSN: [id number] Arrival date & time: 09/14/19  0731     History   Chief Complaint Chief Complaint  Patient presents with   Numbness    HPI Alicia Flowers is a 48 y.o. female.     Patient with history of alcohol abuse drinks daily to help her sleep, cigarette smoking presents with worsening left arm numbness and weakness.  Patient states she is had it for almost 3 months however worsening recently.  No new falls or specific changes.  Patient denies any stroke history.  Patient also has had mild left numbness and the leg but nothing compared to the arm.  No midline neck pain or back pain.  Patient denies any concerning headaches.  Patient denies any history of diabetes or high blood pressure.     Past Medical History:  Diagnosis Date   Tennis elbow     There are no active problems to display for this patient.   Past Surgical History:  Procedure Laterality Date   APPENDECTOMY     TUBAL LIGATION       OB History   No obstetric history on file.      Home Medications    Prior to Admission medications   Medication Sig Start Date End Date Taking? Authorizing Provider  aspirin 325 MG tablet Take 325 mg by mouth 3 (three) times a week.     [provider]  diphenoxylate-atropine (LOMOTIL) 2.5-0.025 MG per tablet Take 1 tablet by mouth 4 (four) times daily as needed for diarrhea or loose stools. 02/11/14   Evalee Jefferson, PA-C  naphazoline-glycerin (CLEAR EYES) 0.012-0.2 % SOLN Place 1-2 drops into both eyes every 4 (four) hours as needed. Redness    [provider]  naproxen sodium (ALEVE) 220 MG tablet Take 440 mg by mouth 2 (two) times daily as needed (tennis elbow). Pain    [provider]    Family History No family history on file.  Social History Social History   Tobacco Use   Smoking status: Current Every Day Smoker    Packs/day: 0.50    Types: Cigarettes   Smokeless tobacco:  Never Used  Substance Use Topics   Alcohol use: Yes    Comment: socially   Drug use: Yes    Types: Marijuana    Comment: last used last night     Allergies   Patient has no known allergies.   Review of Systems Review of Systems  Constitutional: Negative for chills and fever.  HENT: Negative for congestion.   Eyes: Negative for visual disturbance.  Respiratory: Negative for shortness of breath.   Cardiovascular: Negative for chest pain.  Gastrointestinal: Negative for abdominal pain and vomiting.  Genitourinary: Negative for dysuria and flank pain.  Musculoskeletal: Negative for back pain, neck pain and neck stiffness.  Skin: Negative for rash.  Neurological: Positive for weakness, light-headedness and numbness. Negative for headaches.     Physical Exam Updated Vital Signs BP (!) 143/96    Pulse 88    Temp 98.5 F (36.9 C)    Resp (!) 8    Ht 5\' 3"  (1.6 m)    Wt 77.1 kg    LMP 09/07/2019    SpO2 92%    BMI 30.11 kg/m   Physical Exam Vitals signs and nursing note reviewed.  Constitutional:      Appearance: She is well-developed.  HENT:     Head: Normocephalic and atraumatic.  Eyes:  General:        Right eye: No discharge.        Left eye: No discharge.     Conjunctiva/sclera: Conjunctivae normal.  Neck:     Musculoskeletal: Normal range of motion and neck supple.     Trachea: No tracheal deviation.  Cardiovascular:     Rate and Rhythm: Normal rate and regular rhythm.  Pulmonary:     Effort: Pulmonary effort is normal.     Breath sounds: Normal breath sounds.  Abdominal:     General: There is no distension.     Palpations: Abdomen is soft.     Tenderness: There is no abdominal tenderness. There is no guarding.  Musculoskeletal:        General: No swelling.  Skin:    General: Skin is warm.     Capillary Refill: Capillary refill takes less than 2 seconds.     Findings: No rash.  Neurological:     Mental Status: She is alert and oriented to person,  place, and time.     GCS: GCS eye subscore is 4. GCS verbal subscore is 5. GCS motor subscore is 6.     Cranial Nerves: Cranial nerve deficit present.     Sensory: Sensory deficit present.     Motor: Weakness present.     Comments: Patient has decrease sensation left arm versus right arm.  Patient has 4-5 strength with shoulder flexion and wrist extension.  Patient has overall equal strength lower extremities bilateral however patient feels weak on the left subjectively.  With flexion extension of major joints and lower extremities.  Pulses equal bilateral.  Patient has exotropia right eye she is unsure if this is new.  Extraocular muscle function intact.  Pupils equal bilateral 2 mm.  Patient intermittently mild sleepy on exam however awakens to verbal discussion.  Psychiatric:        Attention and Perception: She is inattentive.        Speech: Speech is not rapid and pressured.        Behavior: Behavior is slowed.     Comments: Mild confusion        ED Treatments / Results  Labs (all labs ordered are listed, but only abnormal results are displayed) Labs Reviewed  CBC WITH DIFFERENTIAL/PLATELET - Abnormal; Notable for the following components:      Result Value   WBC 14.4 (*)    RBC 5.15 (*)    Hemoglobin 15.7 (*)    HCT 50.9 (*)    RDW 16.0 (*)    Neutro Abs 12.2 (*)    Lymphs Abs 0.6 (*)    Monocytes Absolute 1.4 (*)    All other components within normal limits  COMPREHENSIVE METABOLIC PANEL - Abnormal; Notable for the following components:   Potassium 6.6 (*)    CO2 18 (*)    Glucose, Bld 163 (*)    BUN 48 (*)    Creatinine, Ser 4.77 (*)    Calcium 6.5 (*)    AST 2,087 (*)    ALT 932 (*)    GFR calc non Af Amer 10 (*)    GFR calc Af Amer 12 (*)    Anion gap 17 (*)    All other components within normal limits  URINALYSIS, ROUTINE W REFLEX MICROSCOPIC - Abnormal; Notable for the following components:   Color, Urine AMBER (*)    APPearance TURBID (*)    Glucose, UA 50  (*)    Hgb urine dipstick  LARGE (*)    Protein, ur 100 (*)    Bacteria, UA MANY (*)    Squamous Epithelial / LPF >50 (*)    Non Squamous Epithelial 6-10 (*)    All other components within normal limits  RAPID URINE DRUG SCREEN, HOSP PERFORMED - Abnormal; Notable for the following components:   Cocaine POSITIVE (*)    Tetrahydrocannabinol POSITIVE (*)    All other components within normal limits  SARS CORONAVIRUS 2 (HOSPITAL ORDER, PERFORMED IN [place] [facility])  URINE CULTURE  ETHANOL  MAGNESIUM  CALCIUM, IONIZED  LIPASE, BLOOD  TRIGLYCERIDES  AMMONIA  BLOOD GAS, VENOUS  LACTIC ACID, PLASMA  LACTIC ACID, PLASMA  BRAIN NATRIURETIC PEPTIDE  PROTIME-INR  TROPONIN I (HIGH SENSITIVITY)    EKG None  Radiology Ct Abdomen Pelvis Wo Contrast  Result Date: 09/14/2019 CLINICAL DATA:  Abdominal distension with elevated liver enzymes EXAM: CT ABDOMEN AND PELVIS WITHOUT CONTRAST TECHNIQUE: Multidetector CT imaging of the abdomen and pelvis was performed following the standard protocol without oral or IV contrast. COMPARISON:  None. FINDINGS: Lower chest: There is airspace consolidation in both lower lobes posteriorly. There is a degree of lower lobe bronchiectatic change is well. Hepatobiliary: No focal liver lesions are evident on this noncontrast enhanced study. The gallbladder wall is not appreciably thickened. There is no biliary duct dilatation. Pancreas: There is no pancreatic mass or inflammatory focus. Spleen: No splenic lesions are evident. Adrenals/Urinary Tract: Adrenals bilaterally appear unremarkable. Kidneys bilaterally show no evident mass or hydronephrosis on either side. There is no evident renal or ureteral calculus on either side. Urinary bladder is midline with wall thickness within normal limits for degree of distention. Stomach/Bowel: The stomach is diffusely distended with air. There is no appreciable bowel wall or mesenteric thickening. No obstructing focus evident.  The terminal ileum appears unremarkable. Vascular/Lymphatic: There is no abdominal aortic aneurysm. There is aortic and iliac artery atherosclerotic calcification. There is no adenopathy evident in the abdomen or pelvis. Reproductive: Uterus is anteverted.  No pelvic mass is demonstrated. Other: Appendix absent. No periappendiceal region inflammation. No abscess or ascites is evident in the abdomen or pelvis. There is postoperative change in the anterior abdominal wall with several clips present. Musculoskeletal: There are foci of degenerative change in the lumbar spine. 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 No sacroiliitis evident. No blastic or lytic bone lesions. No intramuscular lesions evident. IMPRESSION: 1. Airspace consolidation consistent with pneumonia in each posterior lower lobe region. There is a degree of bilateral lower lobe bronchiectasis as well. 2. Stomach distended with air. Gastric wall does not appear appreciably thickened. No small or large bowel distention evident. Question a degree of ileus from the pneumonia causing the gastric distention. 3. No evident bowel obstruction. No abscess in the abdomen or pelvis. Appendix absent. No periappendiceal region inflammatory change. 4.  Aortic and iliac artery atherosclerosis noted. 5. Osteitis condensans ilia bilaterally, a potential source for abdominal pain. Electronically Signed   By: Lowella Grip III M.D.   On: 09/14/2019 11:53   Mr Brain Wo Contrast  Result Date: 09/14/2019 CLINICAL DATA:  Numbness of the left arm over the last 3 months. EXAM: MRI HEAD WITHOUT CONTRAST TECHNIQUE: Multiplanar, multiecho pulse sequences of the brain and surrounding structures were obtained without intravenous contrast. COMPARISON:  None. 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 Otherwise, the brain does not show any old or acute small or large vessel insult. No mass lesion, hemorrhage, hydrocephalus  or  extra-axial collection. Vascular: Major vessels at the base of the brain show flow. This includes the posterior circulation vessels. I do not see convincing evidence venous thrombosis. There is an arachnoid granulation at the right transverse sinus sigmoid sinus junction. 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 Otherwise negative study. Etiology indeterminate. There appears to be flow in the posterior circulation main vessels. I do not see clear evidence of venous thrombosis on this noncontrast study. One could consider CT or MR venography. Electronically Signed   By: Nelson Chimes M.D.   On: 09/14/2019 10:11   Mr Cervical Spine Wo Contrast  Result Date: 09/14/2019 CLINICAL DATA:  Left arm weakness for 3 months EXAM: MRI CERVICAL SPINE WITHOUT CONTRAST TECHNIQUE: Multiplanar, multisequence MR imaging of the cervical spine was performed. No intravenous contrast was administered. COMPARISON:  None. FINDINGS: The study is limited due to patient motion. Alignment: There is straightening of the normal cervical lordosis. Vertebrae: The vertebral body heights are well maintained. No fracture, marrow edema,or pathologic marrow infiltration. Cord: Appears to be normal in signal intensity, however limited due to patient motion. Posterior Fossa, vertebral arteries, paraspinal tissues: The visualized portion of the posterior fossa is unremarkable. Normal flow voids seen within the vertebral arteries. The paraspinal soft tissues are unremarkable. Disc levels: C1-C2: Atlanto-axial junction is normal, without canal narrowing C2-C3: No significant spinal canal or neural foraminal narrowing C3-C4: There is a minimal disc osteophyte complex, however no significant canal or neural foraminal narrowing. C4-C5: There is a disc osteophyte complex and uncovertebral osteophytes which causes mild left and mild-to-moderate  right neural foraminal narrowing. C5-C6: There is a disc osteophyte complex which causes mild bilateral neural foraminal narrowing. C6-C7: Disc osteophyte complex which causes mild right neural foraminal narrowing. C7-T1: No significant spinal canal or neural foraminal narrowing IMPRESSION: The study is limited due to patient motion. Mild lumbar spine spondylosis as described above this is most notable C4-C5 with mild-to-moderate right neural foraminal narrowing. Electronically Signed   By: Prudencio Pair M.D.   On: 09/14/2019 10:02   Dg Chest Port 1 View  Result Date: 09/14/2019 CLINICAL DATA:  Hypoxia EXAM: PORTABLE CHEST 1 VIEW COMPARISON:  06/25/2013 FINDINGS: Cardiomegaly accentuated by low volumes. Diffuse interstitial prominence. No Kerley lines, air bronchogram, effusion or pneumothorax. Gaseous distention of the stomach. IMPRESSION: 1. Cardiomegaly accentuated by low volumes. 2. Interstitial coarsening, favor vascular congestion. 3. Prominent gaseous distension of the stomach. Electronically Signed   By: Monte Fantasia M.D.   On: 09/14/2019 10:00    Procedures .Critical Care Performed by: Elnora Morrison, MD Authorized by: Elnora Morrison, MD   Critical care provider statement:    Critical care time (minutes):  75   Critical care start time:  09/14/2019 10:00 AM   Critical care end time:  09/14/2019 11:15 AM   Critical care time was exclusive of:  Separately billable procedures and treating other patients and teaching time   Critical care was necessary to treat or prevent imminent or life-threatening deterioration of the following conditions:  Metabolic crisis   Critical care was time spent personally by me on the following activities:  Discussions with consultants, evaluation of patient's response to treatment, examination of patient, ordering and performing treatments and interventions, ordering and review of laboratory studies, ordering and review of radiographic studies, pulse oximetry,  re-evaluation of patient's condition, obtaining history from patient or surrogate and review of old charts   (including  critical care time)  Medications Ordered in ED Medications  cefTRIAXone (ROCEPHIN) 1 g in sodium chloride 0.9 % 100 mL IVPB (1 g Intravenous New Bag/Given 09/14/19 1136)  sodium bicarbonate injection 50 mEq (has no administration in time range)  doxycycline (VIBRAMYCIN) 100 mg in sodium chloride 0.9 % 250 mL IVPB (has no administration in time range)  thiamine (VITAMIN B-1) tablet 100 mg (100 mg Oral Given 09/14/19 0812)  calcium gluconate 1 g/ 50 mL sodium chloride IVPB (0 g Intravenous Stopped 09/14/19 1100)  insulin aspart (novoLOG) injection 6 Units (6 Units Intravenous Given 09/14/19 0945)  sodium chloride 0.9 % bolus 500 mL (0 mLs Intravenous Stopped 09/14/19 1045)  dextrose 50 % solution 50 mL (50 mLs Intravenous Given 09/14/19 0950)     Initial Impression / Assessment and Plan / ED Course  I have reviewed the triage vital signs and the nursing notes.  Pertinent labs & imaging results that were available during my care of the patient were reviewed by me and considered in my medical decision making (see chart for details).       Patient with alcohol history presents with worsening neurologic symptoms.  Plan for MRI of the brain and cervical spine patient has no primary care provider to look for any signs of stroke or reason for her worsening findings on exam.  Concern clinically for possibly neuropathy from nerve injury due to alcohol use, metabolic/Ca related, occult stroke, musculoskeletal related or other.  Thiamine given, blood work pending to check calcium sodium and other electrolytes, CBC to check for anemia with her lightheaded symptoms and chest x-ray with her oxygen low 90s without being short of breath.  Blood work returned while patient was in MRI.  Patient has significant abnormalities including hyperkalemia potassium 6.6, hypocalcemia calcium 6, acute renal  failure creatinine 4 and significant elevated liver function.  Patient denies significant abdominal pain or liver disease history however admits to chronic alcohol use patient denied drug use however cocaine returned positive on urine testing.  Urinalysis reviewed with significant epithelial cells, culture sent.  Patient has no fever in the ER.  Patient does have mild leukocytosis. MRI no acute findings.  Discussed these very concerning results with patient, CT abdomen pelvis added without contrast as patient is significant renal failure.  Patient's oxygen stabilized on 2 L nasal cannula. Rocephin/ doxy for CAP.   Discussed with hospitalist for admission.  CT scan results reviewed consistent with pneumonia which would explain her hypoxia and abnormal lung exam.  Patient had distended gastric findings on CT scan.  Hadli A Taiwo was evaluated in Emergency Department on 09/14/2019 for the symptoms described in the history of present illness. She was evaluated in the context of the global COVID-19 pandemic, which necessitated consideration that the patient might be at risk for infection with the SARS-CoV-2 virus that causes COVID-19. Institutional protocols and algorithms that pertain to the evaluation of patients at risk for COVID-19 are in a state of rapid change based on information released by regulatory bodies including the CDC and federal and state organizations. These policies and algorithms were followed during the patient's care in the ED.   Final Clinical Impressions(s) / ED Diagnoses   Final diagnoses:  Left arm weakness  Hypoxia  Hypocalcemia  Hyperkalemia  LFT elevation  Acute renal failure, unspecified acute renal failure type [facility])  Community acquired pneumonia, unspecified laterality    ED Discharge Orders    None       Elnora Morrison, MD 09/14/19  1200 ° °

## 2019-09-14 NOTE — ED Triage Notes (Signed)
Pt c/o of numbness in left arm x 3 months.

## 2019-09-14 NOTE — ED Notes (Signed)
ED TO INPATIENT HANDOFF REPORT  ED Nurse Name and Phone #: [phone]  S Name/Age/Gender Alicia Flowers 48 y.o. female Room/Bed: APA14/APA14  Code Status   Code Status: Full Code  Home/SNF/Other Home Patient oriented to: self, place, time and situation Is this baseline? Yes   Triage Complete: Triage complete  Chief Complaint Lest side numbness  Triage Note Pt c/o of numbness in left arm x 3 months.   Allergies No Known Allergies  Level of Care/Admitting Diagnosis ED Disposition    ED Disposition Condition [facility] Area: [facility] [id number]  Level of Care: Telemetry [5]  Covid Evaluation: Confirmed COVID Negative  Diagnosis: CAP (community acquired pneumoniaUT:[id number]  Admitting Physician: Barton Dubois [[id number]]  Attending Physician: Barton Dubois [[id number]]  Estimated length of stay: past midnight tomorrow  Certification:: I certify this patient will need inpatient services for at least 2 midnights  PT Class (Do Not Modify): Inpatient [101]  PT Acc Code (Do Not Modify): Private [1]       B Medical/Surgery History Past Medical History:  Diagnosis Date  . Tennis elbow    Past Surgical History:  Procedure Laterality Date  . APPENDECTOMY    . TUBAL LIGATION       A IV Location/Drains/Wounds Patient Lines/Drains/Airways Status   Active Line/Drains/Airways    Name:   Placement date:   Placement time:   Site:   Days:   Peripheral IV 09/14/19 Right Hand   09/14/19    0930    Hand   less than 1   Peripheral IV 09/14/19 Right Antecubital   09/14/19    0950    Antecubital   less than 1          Intake/Output Last 24 hours  Intake/Output Summary (Last 24 hours) at 09/14/2019 1402 Last data filed at 09/14/2019 1206 Gross per 24 hour  Intake 650.83 ml  Output -  Net 650.83 ml    Labs/Imaging Results for orders placed or performed during the hospital encounter of 09/14/19 (from the past 48 hour(s))  CBC with Differential      Status: Abnormal   Collection Time: 09/14/19  8:19 AM  Result Value Ref Range   WBC 14.4 (H) 4.0 - 10.5 K/uL   RBC 5.15 (H) 3.87 - 5.11 MIL/uL   Hemoglobin 15.7 (H) 12.0 - 15.0 g/dL   HCT 50.9 (H) 36.0 - 46.0 %   MCV 98.8 80.0 - 100.0 fL   MCH 30.5 26.0 - 34.0 pg   MCHC 30.8 30.0 - 36.0 g/dL   RDW 16.0 (H) 11.5 - 15.5 %   Platelets 375 150 - 400 K/uL   nRBC 0.0 0.0 - 0.2 %   Neutrophils Relative % 86 %   Neutro Abs 12.2 (H) 1.7 - 7.7 K/uL   Lymphocytes Relative 4 %   Lymphs Abs 0.6 (L) 0.7 - 4.0 K/uL   Monocytes Relative 9 %   Monocytes Absolute 1.4 (H) 0.1 - 1.0 K/uL   Eosinophils Relative 1 %   Eosinophils Absolute 0.1 0.0 - 0.5 K/uL   Basophils Relative 0 %   Basophils Absolute 0.1 0.0 - 0.1 K/uL   Immature Granulocytes 0 %   Abs Immature Granulocytes 0.06 0.00 - 0.07 K/uL    Comment: Performed at [facility], [address] [place], [place] [address]  Comprehensive metabolic panel     Status: Abnormal   Collection Time: 09/14/19  8:19 AM  Result Value Ref Range  Sodium 136 135 - 145 mmol/L   Potassium 6.6 (HH) 3.5 - 5.1 mmol/L    Comment: CRITICAL RESULT CALLED TO, READ BACK BY AND VERIFIED WITH: WHITE,M AT 8:55AM ON 09/14/19 BY FESTERMAN,C    Chloride 101 98 - 111 mmol/L   CO2 18 (L) 22 - 32 mmol/L   Glucose, Bld 163 (H) 70 - 99 mg/dL   BUN 48 (H) 6 - 20 mg/dL   Creatinine, Ser 4.77 (H) 0.44 - 1.00 mg/dL   Calcium 6.5 (L) 8.9 - 10.3 mg/dL   Total Protein 7.8 6.5 - 8.1 g/dL   Albumin 3.7 3.5 - 5.0 g/dL   AST 2,087 (H) 15 - 41 U/L   ALT 932 (H) 0 - 44 U/L   Alkaline Phosphatase 102 38 - 126 U/L   Total Bilirubin 0.3 0.3 - 1.2 mg/dL   GFR calc non Af Amer 10 (L) >60 mL/min   GFR calc Af Amer 12 (L) >60 mL/min   Anion gap 17 (H) 5 - 15    Comment: Performed at [facility], [address] [place], [place] [address]  Ethanol     Status: None   Collection Time: 09/14/19  8:19 AM  Result Value Ref Range   Alcohol, Ethyl (B) <10 <10 mg/dL    Comment:  (NOTE) Lowest detectable limit for serum alcohol is 10 mg/dL. For medical purposes only. Performed at [facility], [address], [place], [place] [address]   Protime-INR     Status: None   Collection Time: 09/14/19  8:19 AM  Result Value Ref Range   Prothrombin Time 13.6 11.4 - 15.2 seconds   INR 1.1 0.8 - 1.2    Comment: (NOTE) INR goal varies based on device and disease states. Performed at [facility], [address] [place], [place] [address]   Urinalysis, Routine w reflex microscopic     Status: Abnormal   Collection Time: 09/14/19  8:41 AM  Result Value Ref Range   Color, Urine AMBER (A) YELLOW   APPearance TURBID (A) CLEAR   Specific Gravity, Urine 1.017 1.005 - 1.030   pH 6.0 5.0 - 8.0   Glucose, UA 50 (A) NEGATIVE mg/dL   Hgb urine dipstick LARGE (A) NEGATIVE   Bilirubin Urine NEGATIVE NEGATIVE   Ketones, ur NEGATIVE NEGATIVE mg/dL   Protein, ur 100 (A) NEGATIVE mg/dL   Nitrite NEGATIVE NEGATIVE   Leukocytes,Ua NEGATIVE NEGATIVE   RBC / HPF 11-20 0 - 5 RBC/hpf   WBC, UA 21-50 0 - 5 WBC/hpf   Bacteria, UA MANY (A) NONE SEEN   Squamous Epithelial / LPF >50 (H) 0 - 5   WBC Clumps PRESENT    Mucus PRESENT    Budding Yeast PRESENT    Amorphous [place] PRESENT    Non Squamous Epithelial 6-10 (A) NONE SEEN    Comment: Performed at [facility], [address] [place], [place] [address]  Rapid urine drug screen (hospital performed)     Status: Abnormal   Collection Time: 09/14/19  8:41 AM  Result Value Ref Range   Opiates NONE DETECTED NONE DETECTED   Cocaine POSITIVE (A) NONE DETECTED   Benzodiazepines NONE DETECTED NONE DETECTED   Amphetamines NONE DETECTED NONE DETECTED   Tetrahydrocannabinol POSITIVE (A) NONE DETECTED   Barbiturates NONE DETECTED NONE DETECTED    Comment: (NOTE) DRUG SCREEN FOR MEDICAL PURPOSES ONLY.  IF CONFIRMATION IS NEEDED FOR ANY PURPOSE, NOTIFY LAB WITHIN 5 DAYS. LOWEST DETECTABLE LIMITS FOR URINE DRUG SCREEN Drug Class  Cutoff (ng/mL) Amphetamine and metabolites    1000 Barbiturate and metabolites    200 Benzodiazepine                 [id number] Tricyclics and metabolites     300 Opiates and metabolites        300 Cocaine and metabolites        300 THC                            50 Performed at [facility], [address], [place], [place] [address]   SARS Coronavirus 2 [facility] order, Performed in [facility] hospital lab) Nasopharyngeal Nasopharyngeal Swab     Status: None   Collection Time: 09/14/19 10:04 AM   Specimen: Nasopharyngeal Swab  Result Value Ref Range   SARS Coronavirus 2 NEGATIVE NEGATIVE    Comment: (NOTE) If result is NEGATIVE SARS-CoV-2 target nucleic acids are NOT DETECTED. The SARS-CoV-2 RNA is generally detectable in upper and lower  respiratory specimens during the acute phase of infection. The lowest  concentration of SARS-CoV-2 viral copies this assay can detect is 250  copies / mL. A negative result does not preclude SARS-CoV-2 infection  and should not be used as the sole basis for treatment or other  patient management decisions.  A negative result may occur with  improper specimen collection / handling, submission of specimen other  than nasopharyngeal swab, presence of viral mutation(s) within the  areas targeted by this assay, and inadequate number of viral copies  (<250 copies / mL). A negative result must be combined with clinical  observations, patient history, and epidemiological information. If result is POSITIVE SARS-CoV-2 target nucleic acids are DETECTED. The SARS-CoV-2 RNA is generally detectable in upper and lower  respiratory specimens dur ing the acute phase of infection.  Positive  results are indicative of active infection with SARS-CoV-2.  Clinical  correlation with patient history and other diagnostic information is  necessary to determine patient infection status.  Positive results do  not rule out bacterial infection or co-infection with other  viruses. If result is PRESUMPTIVE POSTIVE SARS-CoV-2 nucleic acids MAY BE PRESENT.   A presumptive positive result was obtained on the submitted specimen  and confirmed on repeat testing.  While 2019 novel coronavirus  (SARS-CoV-2) nucleic acids may be present in the submitted sample  additional confirmatory testing may be necessary for epidemiological  and / or clinical management purposes  to differentiate between  SARS-CoV-2 and other Sarbecovirus currently known to infect humans.  If clinically indicated additional testing with an alternate test  methodology [id number]) is advised. The SARS-CoV-2 RNA is generally  detectable in upper and lower respiratory sp ecimens during the acute  phase of infection. The expected result is Negative. Fact Sheet for Patients:  StrictlyIdeas.no Fact Sheet for Healthcare Providers: BankingDealers.co.za This test is not yet approved or cleared by the [place] FDA and has been authorized for detection and/or diagnosis of SARS-CoV-2 by FDA under an Emergency Use Authorization (EUA).  This EUA will remain in effect (meaning this test can be used) for the duration of the COVID-19 declaration under Section 564(b)(1) of the Act, 21 U.S.C. section 360bbb-3(b)(1), unless the authorization is terminated or revoked sooner. Performed at [facility], [address] [place], [place] [address]   Magnesium     Status: Abnormal   Collection Time: 09/14/19 11:53 AM  Result Value Ref Range   Magnesium 2.5 (H) 1.7 -  2.4 mg/dL    Comment: Performed at [facility], [address] [place], [place] [address]  Lipase, blood     Status: None   Collection Time: 09/14/19 11:53 AM  Result Value Ref Range   Lipase 27 11 - 51 U/L    Comment: Performed at [facility], [address], [place], [place] [address]  Triglycerides     Status: Abnormal   Collection Time: 09/14/19 11:53 AM  Result Value Ref Range   Triglycerides  177 (H) <150 mg/dL    Comment: Performed at [facility], [address], [place], [place] [address]  Ammonia     Status: None   Collection Time: 09/14/19 11:53 AM  Result Value Ref Range   Ammonia 27 9 - 35 umol/L    Comment: Performed at [facility] [address], [place], [place] [address]  Blood gas, venous     Status: Abnormal   Collection Time: 09/14/19 11:53 AM  Result Value Ref Range   FIO2 32.00    pH, Ven 7.130 (LL) 7.250 - 7.430    Comment: CRITICAL RESULT CALLED TO, READ BACK BY AND VERIFIED WITH: KEMP,C@1306  BY MATTHEWS,B 9.17.2020    pCO2, Ven 55.9 44.0 - 60.0 mmHg   pO2, Ven 38.1 32.0 - 45.0 mmHg   Bicarbonate 14.3 (L) 20.0 - 28.0 mmol/L   Acid-base deficit 9.9 (H) 0.0 - 2.0 mmol/L   O2 Saturation 53.7 %   Patient temperature 36.5     Comment: Performed at [facility], [address], [place], [place] [address]  Troponin I (High Sensitivity)     Status: Abnormal   Collection Time: 09/14/19 11:53 AM  Result Value Ref Range   Troponin I (High Sensitivity) 7,286 (HH) <18 ng/L    Comment: CRITICAL RESULT CALLED TO, READ BACK BY AND VERIFIED WITH: CARDWELL, L@1345  BY MATTEHWS, B 9.17.2020 (NOTE) Elevated high sensitivity troponin I (hsTnI) values and significant  changes across serial measurements may suggest ACS but many other  chronic and acute conditions are known to elevate hsTnI results.  Refer to the Links section for chest pain algorithms and additional  guidance. Performed at [facility], [address], [place], [place] [address]   Brain natriuretic peptide     Status: Abnormal   Collection Time: 09/14/19 11:53 AM  Result Value Ref Range   B Natriuretic Peptide 427.0 (H) 0.0 - 100.0 pg/mL    Comment: Performed at [facility], [address] [place], [place] [address]  Lactic acid, plasma     Status: Abnormal   Collection Time: 09/14/19 11:54 AM  Result Value Ref Range   Lactic Acid, Venous 2.4 (HH) 0.5 - 1.9 mmol/L    Comment: CRITICAL RESULT CALLED  TO, READ BACK BY AND VERIFIED WITH: CARDWELL,L@1333  BY MATTHEWS, B 9.17.2020 Performed at [facility], [address], [place], [place] [address]   TSH     Status: Abnormal   Collection Time: 09/14/19 12:09 PM  Result Value Ref Range   TSH 7.473 (H) 0.350 - 4.500 uIU/mL    Comment: Performed by a 3rd Generation assay with a functional sensitivity of <=0.01 uIU/mL. Performed at [facility], [address], [place],  [address]   Comprehensive metabolic panel     Status: Abnormal   Collection Time: 09/14/19 12:10 PM  Result Value Ref Range   Sodium 134 (L) 135 - 145 mmol/L   Potassium 6.9 (HH) 3.5 - 5.1 mmol/L    Comment: CRITICAL RESULT CALLED TO, READ BACK BY AND  VERIFIED WITH: CARDWELL,L@1344  BY MATTHEWS, B 9.17.2020    Chloride 102 98 - 111 mmol/L   CO2 17 (L) 22 - 32 mmol/L   Glucose, Bld 145 (H) 70 - 99 mg/dL   BUN 51 (H) 6 - 20 mg/dL   Creatinine, Ser 4.70 (H) 0.44 - 1.00 mg/dL   Calcium 6.5 (L) 8.9 - 10.3 mg/dL   Total Protein 7.5 6.5 - 8.1 g/dL   Albumin 3.5 3.5 - 5.0 g/dL   AST 2,046 (H) 15 - 41 U/L   ALT 959 (H) 0 - 44 U/L   Alkaline Phosphatase 108 38 - 126 U/L   Total Bilirubin 0.4 0.3 - 1.2 mg/dL   GFR calc non Af Amer 10 (L) >60 mL/min   GFR calc Af Amer 12 (L) >60 mL/min   Anion gap 15 5 - 15    Comment: Performed at [facility], [address], [place], [place] [address]  Magnesium     Status: Abnormal   Collection Time: 09/14/19 12:10 PM  Result Value Ref Range   Magnesium 2.7 (H) 1.7 - 2.4 mg/dL    Comment: Performed at [facility], [address], [place], [place] [address]  Phosphorus     Status: Abnormal   Collection Time: 09/14/19 12:10 PM  Result Value Ref Range   Phosphorus 10.5 (H) 2.5 - 4.6 mg/dL    Comment: Performed at [facility], [address] [place], [place] [address]  CBC     Status: Abnormal   Collection Time: 09/14/19 12:10 PM  Result Value Ref Range   WBC 13.5 (H) 4.0 - 10.5 K/uL   RBC 4.96 3.87 - 5.11 MIL/uL   Hemoglobin  15.3 (H) 12.0 - 15.0 g/dL   HCT 49.1 (H) 36.0 - 46.0 %   MCV 99.0 80.0 - 100.0 fL   MCH 30.8 26.0 - 34.0 pg   MCHC 31.2 30.0 - 36.0 g/dL   RDW 15.9 (H) 11.5 - 15.5 %   Platelets 329 150 - 400 K/uL   nRBC 0.1 0.0 - 0.2 %    Comment: Performed at [facility], [address], [place], [place] [address]   Ct Abdomen Pelvis Wo Contrast  Result Date: 09/14/2019 CLINICAL DATA:  Abdominal distension with elevated liver enzymes EXAM: CT ABDOMEN AND PELVIS WITHOUT CONTRAST TECHNIQUE: Multidetector CT imaging of the abdomen and pelvis was performed following the standard protocol without oral or IV contrast. COMPARISON:  None. FINDINGS: Lower chest: There is airspace consolidation in both lower lobes posteriorly. There is a degree of lower lobe bronchiectatic change is well. Hepatobiliary: No focal liver lesions are evident on this noncontrast enhanced study. The gallbladder wall is not appreciably thickened. There is no biliary duct dilatation. Pancreas: There is no pancreatic mass or inflammatory focus. Spleen: No splenic lesions are evident. Adrenals/Urinary Tract: Adrenals bilaterally appear unremarkable. Kidneys bilaterally show no evident mass or hydronephrosis on either side. There is no evident renal or ureteral calculus on either side. Urinary bladder is midline with wall thickness within normal limits for degree of distention. Stomach/Bowel: The stomach is diffusely distended with air. There is no appreciable bowel wall or mesenteric thickening. No obstructing focus evident. The terminal ileum appears unremarkable. Vascular/Lymphatic: There is no abdominal aortic aneurysm. There is aortic and iliac artery atherosclerotic calcification. There is no adenopathy evident in the abdomen or pelvis. Reproductive: Uterus is anteverted.  No pelvic mass is demonstrated. Other: Appendix absent. No periappendiceal region inflammation. No abscess or ascites is evident in the abdomen  or pelvis. There is postoperative  change in the anterior abdominal wall with several clips present. Musculoskeletal: There are foci of degenerative change in the lumbar spine. 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 No sacroiliitis evident. No blastic or lytic bone lesions. No intramuscular lesions evident. IMPRESSION: 1. Airspace consolidation consistent with pneumonia in each posterior lower lobe region. There is a degree of bilateral lower lobe bronchiectasis as well. 2. Stomach distended with air. Gastric wall does not appear appreciably thickened. No small or large bowel distention evident. Question a degree of ileus from the pneumonia causing the gastric distention. 3. No evident bowel obstruction. No abscess in the abdomen or pelvis. Appendix absent. No periappendiceal region inflammatory change. 4.  Aortic and iliac artery atherosclerosis noted. 5. Osteitis condensans ilia bilaterally, a potential source for abdominal pain. Electronically Signed   By: Lowella Grip III M.D.   On: 09/14/2019 11:53   Mr Brain Wo Contrast  Result Date: 09/14/2019 CLINICAL DATA:  Numbness of the left arm over the last 3 months. EXAM: MRI HEAD WITHOUT CONTRAST TECHNIQUE: Multiplanar, multiecho pulse sequences of the brain and surrounding structures were obtained without intravenous contrast. COMPARISON:  None. 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 Otherwise, the brain does not show any old or acute small or large vessel insult. No mass lesion, hemorrhage, hydrocephalus or extra-axial collection. Vascular: Major vessels at the base of the brain show flow. This includes the posterior circulation vessels. I do not see convincing evidence venous thrombosis. There is an arachnoid granulation at the right transverse sinus sigmoid sinus junction. 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  Otherwise negative study. Etiology indeterminate. There appears to be flow in the posterior circulation main vessels. I do not see clear evidence of venous thrombosis on this noncontrast study. One could consider CT or MR venography. Electronically Signed   By: Nelson Chimes M.D.   On: 09/14/2019 10:11   Mr Cervical Spine Wo Contrast  Result Date: 09/14/2019 CLINICAL DATA:  Left arm weakness for 3 months EXAM: MRI CERVICAL SPINE WITHOUT CONTRAST TECHNIQUE: Multiplanar, multisequence MR imaging of the cervical spine was performed. No intravenous contrast was administered. COMPARISON:  None. FINDINGS: The study is limited due to patient motion. Alignment: There is straightening of the normal cervical lordosis. Vertebrae: The vertebral body heights are well maintained. No fracture, marrow edema,or pathologic marrow infiltration. Cord: Appears to be normal in signal intensity, however limited due to patient motion. Posterior Fossa, vertebral arteries, paraspinal tissues: The visualized portion of the posterior fossa is unremarkable. Normal flow voids seen within the vertebral arteries. The paraspinal soft tissues are unremarkable. Disc levels: C1-C2: Atlanto-axial junction is normal, without canal narrowing C2-C3: No significant spinal canal or neural foraminal narrowing C3-C4: There is a minimal disc osteophyte complex, however no significant canal or neural foraminal narrowing. C4-C5: There is a disc osteophyte complex and uncovertebral osteophytes which causes mild left and mild-to-moderate right neural foraminal narrowing. C5-C6: There is a disc osteophyte complex which causes mild bilateral neural foraminal narrowing. C6-C7: Disc osteophyte complex which causes mild right neural foraminal narrowing. C7-T1: No significant spinal canal or neural foraminal narrowing IMPRESSION: The study is limited due to patient motion. Mild lumbar spine spondylosis as described above this is most notable C4-C5 with  mild-to-moderate right neural foraminal narrowing. Electronically Signed   By: Prudencio Pair M.D.   On: 09/14/2019 10:02  US Renal  Result Date: 09/14/2019 CLINICAL DATA:  Acute kidney injury. EXAM: RENAL / URINARY TRACT ULTRASOUND COMPLETE COMPARISON:  None. FINDINGS: Right Kidney: Renal measurements: 11.9 x 4.8 x 4.8 cm = volume: 149 mL. Minimally increased echogenicity of renal parenchyma is noted. No mass or hydronephrosis visualized. Left Kidney: Renal measurements: 12.4 x 5.4 x 4.8 cm = volume: 171 mL. Minimally increased echogenicity of renal parenchyma is noted. No mass or hydronephrosis visualized. Bladder: Not visualized due to body habitus. IMPRESSION: Minimally increased echogenicity of renal parenchyma is noted bilaterally suggesting medical renal disease. No hydronephrosis or renal obstruction is noted. Bladder is not visualized. Electronically Signed   By: Marijo Conception M.D.   On: 09/14/2019 13:28   Dg Chest Port 1 View  Result Date: 09/14/2019 CLINICAL DATA:  Hypoxia EXAM: PORTABLE CHEST 1 VIEW COMPARISON:  06/25/2013 FINDINGS: Cardiomegaly accentuated by low volumes. Diffuse interstitial prominence. No Kerley lines, air bronchogram, effusion or pneumothorax. Gaseous distention of the stomach. IMPRESSION: 1. Cardiomegaly accentuated by low volumes. 2. Interstitial coarsening, favor vascular congestion. 3. Prominent gaseous distension of the stomach. Electronically Signed   By: Monte Fantasia M.D.   On: 09/14/2019 10:00    Pending Labs Unresulted Labs (From admission, onward)    Start     Ordered   09/15/19 [id number]  Basic metabolic panel  Tomorrow morning,   R     09/14/19 1209   09/15/19 0500  Lipid panel  Tomorrow morning,   R     09/14/19 1209   09/15/19 0500  CBC  Tomorrow morning,   R     09/14/19 1216   09/14/19 1213  Legionella Pneumophila Serogp 1 Ur Ag  Once,   STAT     09/14/19 1216   09/14/19 1212  Culture, sputum-assessment  Once,   STAT     09/14/19 1216   09/14/19  1212  Strep pneumoniae urinary antigen  Once,   STAT     09/14/19 1216   09/14/19 1210  Hepatitis panel, acute  Once,   STAT     09/14/19 1216   09/14/19 1210  HIV antibody (Routine Screening)  Once,   STAT     09/14/19 1216   09/14/19 1209  Vitamin B12  Once,   STAT     09/14/19 1209   09/14/19 1208  Hemoglobin A1c  Once,   STAT     09/14/19 1209   09/14/19 0957  Lactic acid, plasma  Now then every 2 hours,   STAT     09/14/19 0956   09/14/19 0930  Urine culture  ONCE - STAT,   STAT     09/14/19 0929   09/14/19 0857  Calcium, ionized  Once,   STAT     09/14/19 0858          Vitals/Pain Today's Vitals   09/14/19 1100 09/14/19 1130 09/14/19 1138 09/14/19 1300  BP: 124/89 (!) 143/96  135/87  Pulse: 87  88   Resp: 12 12 (!) 8   Temp:      SpO2: (!) 88%  92%   Weight:      Height:      PainSc:        Isolation Precautions No active isolations  Medications Medications  doxycycline (VIBRAMYCIN) 100 mg in sodium chloride 0.9 % 250 mL IVPB (has no administration in time range)  heparin injection 5,000 Units (has no administration in time range)  sodium bicarbonate 150 mEq in sterile water 1,000 mL  infusion (has no administration in time range)  aspirin EC tablet 81 mg (81 mg Oral Given 09/14/19 1346)  pantoprazole (PROTONIX) EC tablet 40 mg (40 mg Oral Given 09/14/19 1347)  sodium chloride flush (NS) 0.9 % injection 3 mL (has no administration in time range)  0.9 %  sodium chloride infusion (has no administration in time range)  insulin aspart (novoLOG) injection 0-9 Units (has no administration in time range)  insulin aspart (novoLOG) injection 0-5 Units (has no administration in time range)  azithromycin (ZITHROMAX) 500 mg in sodium chloride 0.9 % 250 mL IVPB (500 mg Intravenous New Bag/Given 09/14/19 1346)  LORazepam (ATIVAN) tablet 1-4 mg (has no administration in time range)    Or  LORazepam (ATIVAN) injection 1-4 mg (has no administration in time range)  thiamine  (VITAMIN B-1) tablet 100 mg (100 mg Oral Given 09/14/19 1347)    Or  thiamine (B-1) injection 100 mg ( Intravenous See Alternative [id number])  folic acid (FOLVITE) tablet 1 mg (1 mg Oral Given 09/14/19 1347)  multivitamin with minerals tablet 1 tablet (1 tablet Oral Given 09/14/19 1347)  sodium chloride flush (NS) 0.9 % injection 3 mL (has no administration in time range)  0.9 %  sodium chloride infusion (has no administration in time range)  cefTRIAXone (ROCEPHIN) 2 g in sodium chloride 0.9 % 100 mL IVPB (has no administration in time range)  cefTRIAXone (ROCEPHIN) 1 g in sodium chloride 0.9 % 100 mL IVPB (1 g Intravenous New Bag/Given 09/14/19 1355)  thiamine (VITAMIN B-1) tablet 100 mg (100 mg Oral Given 09/14/19 0812)  calcium gluconate 1 g/ 50 mL sodium chloride IVPB (0 g Intravenous Stopped 09/14/19 1100)  insulin aspart (novoLOG) injection 6 Units (6 Units Intravenous Given 09/14/19 0945)  sodium chloride 0.9 % bolus 500 mL (0 mLs Intravenous Stopped 09/14/19 1045)  dextrose 50 % solution 50 mL (50 mLs Intravenous Given 09/14/19 0950)  cefTRIAXone (ROCEPHIN) 1 g in sodium chloride 0.9 % 100 mL IVPB (0 g Intravenous Stopped 09/14/19 1206)  sodium bicarbonate injection 50 mEq (50 mEq Intravenous Given 09/14/19 1225)  sodium polystyrene (KAYEXALATE) 15 GM/60ML suspension 15 g (15 g Oral Given 09/14/19 1354)    Mobility walks Low fall risk   Focused Assessments    R Recommendations: See Admitting Provider Note  Report given to:   Additional Notes:

## 2019-09-14 NOTE — Progress Notes (Signed)
  Echocardiogram 2D Echocardiogram has been performed.  Alicia Flowers 09/14/2019, 4:16 PM

## 2019-09-14 NOTE — ED Notes (Signed)
Patient transported to CT 

## 2019-09-14 NOTE — ED Notes (Signed)
Date and time results received: 09/14/19 1309  Test: Venous pH Critical Value: 7.130  Name of Provider Notified: Dr. Dyann Kief   Orders Received? Or Actions Taken?: See chart

## 2019-09-14 NOTE — ED Notes (Signed)
CRITICAL VALUE ALERT  Critical Value:  Potassium 6.6  Date & Time Notied:  09/14/19 0855  Provider Notified: Dr. Reather Converse  Orders Received/Actions taken: EDP notified

## 2019-09-14 NOTE — ED Notes (Signed)
Patient transported to MRI 

## 2019-09-14 NOTE — ED Notes (Signed)
Held PO meds. Pt nauseated at this time.

## 2019-09-14 NOTE — Progress Notes (Signed)
Contacted Dr. Dyann Kief wth concern over elevated troponin and irregular EKG and he said he had consulted with cardiology and it was felt that these were due to electrolytes   She denies chest pain but complains of lower back pain.  She says that her left arm has been numb for 3 months but her left leg has become weak today. Several attempts made for second IV and ED called for help for ultrasound attempt.

## 2019-09-15 ENCOUNTER — Encounter (HOSPITAL_COMMUNITY)
Admission: EM | Disposition: A | Discharge status: Home Health | Payer: Self-pay | Source: Home / Self Care | Attending: Internal Medicine

## 2019-09-15 ENCOUNTER — Encounter (HOSPITAL_COMMUNITY): Payer: Self-pay | Admitting: *Deleted

## 2019-09-15 ENCOUNTER — Inpatient Hospital Stay (HOSPITAL_COMMUNITY): Payer: Medicaid Other

## 2019-09-15 DIAGNOSIS — R7989 Other specified abnormal findings of blood chemistry: Secondary | ICD-10-CM

## 2019-09-15 DIAGNOSIS — Z720 Tobacco use: Secondary | ICD-10-CM

## 2019-09-15 DIAGNOSIS — E782 Mixed hyperlipidemia: Secondary | ICD-10-CM

## 2019-09-15 DIAGNOSIS — N179 Acute kidney failure, unspecified: Secondary | ICD-10-CM | POA: Diagnosis not present

## 2019-09-15 DIAGNOSIS — Z992 Dependence on renal dialysis: Secondary | ICD-10-CM

## 2019-09-15 DIAGNOSIS — R740 Nonspecific elevation of levels of transaminase and lactic acid dehydrogenase [LDH]: Secondary | ICD-10-CM

## 2019-09-15 HISTORY — PX: CENTRAL VENOUS CATHETER INSERTION: SHX401

## 2019-09-15 LAB — GLUCOSE, CAPILLARY
Glucose-Capillary: 115 mg/dL — ABNORMAL HIGH (ref 70–99)
Glucose-Capillary: 122 mg/dL — ABNORMAL HIGH (ref 70–99)
Glucose-Capillary: 101 mg/dL — ABNORMAL HIGH (ref 70–99)

## 2019-09-15 LAB — LIPID PANEL
Cholesterol: 159 mg/dL (ref 0–200)
HDL: 31 mg/dL — ABNORMAL LOW (ref >40)
LDL Cholesterol: 80 mg/dL (ref 0–99)
Total CHOL/HDL Ratio: 5.1 RATIO
Triglycerides: 241 mg/dL — ABNORMAL HIGH (ref <150)
VLDL: 48 mg/dL — ABNORMAL HIGH (ref 0–40)

## 2019-09-15 LAB — FANA STAINING PATTERNS
Nucleolar Pattern: 1:320 {titer} — ABNORMAL HIGH
Speckled Pattern: 1:320 {titer} — ABNORMAL HIGH

## 2019-09-15 LAB — CBC
HCT: 40.5 % (ref 36.0–46.0)
Hemoglobin: 13.7 g/dL (ref 12.0–15.0)
MCH: 30.9 pg (ref 26.0–34.0)
MCHC: 33.8 g/dL (ref 30.0–36.0)
MCV: 91.4 fL (ref 80.0–100.0)
Platelets: 267 10*3/uL (ref 150–400)
RBC: 4.43 MIL/uL (ref 3.87–5.11)
RDW: 14.9 % (ref 11.5–15.5)
WBC: 13.5 10*3/uL — ABNORMAL HIGH (ref 4.0–10.5)
nRBC: 0.2 % (ref 0.0–0.2)

## 2019-09-15 LAB — HEPATITIS PANEL, ACUTE
HCV Ab: <0.1 s/co ratio (ref 0.0–0.9)
Hep A IgM: NEGATIVE
Hep B C IgM: NEGATIVE
Hepatitis B Surface Ag: NEGATIVE

## 2019-09-15 LAB — COMPREHENSIVE METABOLIC PANEL
ALT: 709 U/L — ABNORMAL HIGH (ref 0–44)
AST: 1187 U/L — ABNORMAL HIGH (ref 15–41)
Albumin: 2.9 g/dL — ABNORMAL LOW (ref 3.5–5.0)
Alkaline Phosphatase: 115 U/L (ref 38–126)
Anion gap: 14 (ref 5–15)
BUN: 65 mg/dL — ABNORMAL HIGH (ref 6–20)
CO2: 22 mmol/L (ref 22–32)
Calcium: 5.6 mg/dL — CL (ref 8.9–10.3)
Chloride: 98 mmol/L (ref 98–111)
Creatinine, Ser: 5.22 mg/dL — ABNORMAL HIGH (ref 0.44–1.00)
GFR calc Af Amer: 11 mL/min — ABNORMAL LOW (ref >60)
GFR calc non Af Amer: 9 mL/min — ABNORMAL LOW (ref >60)
Glucose, Bld: 126 mg/dL — ABNORMAL HIGH (ref 70–99)
Potassium: 6 mmol/L — ABNORMAL HIGH (ref 3.5–5.1)
Sodium: 134 mmol/L — ABNORMAL LOW (ref 135–145)
Total Bilirubin: 0.3 mg/dL (ref 0.3–1.2)
Total Protein: 6.2 g/dL — ABNORMAL LOW (ref 6.5–8.1)

## 2019-09-15 LAB — MRSA PCR SCREENING: MRSA by PCR: NEGATIVE

## 2019-09-15 LAB — ANTISTREPTOLYSIN O TITER: ASO: 141 IU/mL (ref 0.0–200.0)

## 2019-09-15 LAB — HIV ANTIBODY (ROUTINE TESTING W REFLEX): HIV Screen 4th Generation wRfx: NONREACTIVE

## 2019-09-15 LAB — GLOMERULAR BASEMENT MEMBRANE ANTIBODIES: GBM Ab: 4 units (ref 0–20)

## 2019-09-15 LAB — HEMOGLOBIN A1C
Hgb A1c MFr Bld: 5.3 % (ref 4.8–5.6)
Mean Plasma Glucose: 105 mg/dL

## 2019-09-15 LAB — C4 COMPLEMENT: Complement C4, Body Fluid: 25 mg/dL (ref 14–44)

## 2019-09-15 LAB — ANTINUCLEAR ANTIBODIES, IFA: ANA Ab, IFA: POSITIVE — AB

## 2019-09-15 LAB — CALCIUM, IONIZED: Calcium, Ionized, Serum: 3.3 mg/dL — ABNORMAL LOW (ref 4.5–5.6)

## 2019-09-15 LAB — ANTI-DNA ANTIBODY, DOUBLE-STRANDED: ds DNA Ab: <1 IU/mL (ref 0–9)

## 2019-09-15 LAB — C3 COMPLEMENT: C3 Complement: 90 mg/dL (ref 82–167)

## 2019-09-15 SURGERY — INSERTION, CATHETER, CENTRAL VENOUS, ADULT
Anesthesia: LOCAL

## 2019-09-15 MED ORDER — SODIUM CHLORIDE 0.9 % IV SOLN
500.0000 mg | Freq: Every day | INTRAVENOUS | Status: DC
  Administered 2019-09-16 – 2019-09-19 (×4): 500 mg via INTRAVENOUS
  Filled 2019-09-15 (×5): qty 4

## 2019-09-15 MED ORDER — AZITHROMYCIN 250 MG PO TABS
500.0000 mg | ORAL_TABLET | Freq: Every day | ORAL | Status: AC
  Administered 2019-09-15 – 2019-09-18 (×4): 500 mg via ORAL
  Filled 2019-09-15 (×4): qty 2

## 2019-09-15 MED ORDER — CHLORHEXIDINE GLUCONATE CLOTH 2 % EX PADS
6.0000 | MEDICATED_PAD | Freq: Every day | CUTANEOUS | Status: DC
  Administered 2019-09-17 – 2019-09-23 (×4): 6 via TOPICAL

## 2019-09-15 MED ORDER — HEPARIN SODIUM (PORCINE) 1000 UNIT/ML IJ SOLN
4000.0000 [IU] | Freq: Once | INTRAMUSCULAR | Status: AC
  Administered 2019-09-15: 19:00:00 4000 [IU]

## 2019-09-15 MED ORDER — CHLORHEXIDINE GLUCONATE CLOTH 2 % EX PADS: 6.0000 | MEDICATED_PAD | Freq: Once | CUTANEOUS | Status: DC

## 2019-09-15 MED ORDER — CALCIUM GLUCONATE-NACL 1-0.675 GM/50ML-% IV SOLN
1.0000 g | Freq: Once | INTRAVENOUS | Status: AC
  Administered 2019-09-15: 13:00:00 1000 mg via INTRAVENOUS
  Filled 2019-09-15: qty 50

## 2019-09-15 MED ORDER — LEVOTHYROXINE SODIUM 50 MCG PO TABS
50.0000 ug | ORAL_TABLET | Freq: Every day | ORAL | Status: DC
  Administered 2019-09-16 – 2019-09-28 (×12): 50 ug via ORAL
  Filled 2019-09-15 (×13): qty 1

## 2019-09-15 MED ORDER — SODIUM CHLORIDE 0.9 % IV SOLN
INTRAVENOUS | Status: DC
  Administered 2019-09-15 – 2019-09-17 (×5): via INTRAVENOUS

## 2019-09-15 MED ORDER — ATORVASTATIN CALCIUM 20 MG PO TABS
20.0000 mg | ORAL_TABLET | Freq: Every day | ORAL | Status: DC
  Administered 2019-09-15 – 2019-09-28 (×13): 20 mg via ORAL
  Filled 2019-09-15 (×16): qty 1

## 2019-09-15 MED ORDER — SODIUM CHLORIDE 0.9 % IV SOLN: 1.0000 g | Freq: Once | INTRAVENOUS | Status: DC

## 2019-09-15 SURGICAL SUPPLY — 10 items
CLOTH BEACON ORANGE TIMEOUT ST (SAFETY) ×2 IMPLANT
COVER LIGHT HANDLE STERIS (MISCELLANEOUS) ×4 IMPLANT
DURAPREP 26ML APPLICATOR (WOUND CARE) ×2 IMPLANT
GLOVE BIOGEL PI IND STRL 7.0 (GLOVE) ×1 IMPLANT
GLOVE BIOGEL PI INDICATOR 7.0 (GLOVE) ×1
GLOVE SURG SS PI 7.5 STRL IVOR (GLOVE) ×2 IMPLANT
GOWN STRL REUS W/TWL LRG LVL3 (GOWN DISPOSABLE) ×6 IMPLANT
MANIFOLD NEPTUNE II (INSTRUMENTS) ×2 IMPLANT
TOWEL OR 17X26 4PK STRL BLUE (TOWEL DISPOSABLE) ×2 IMPLANT
TRAY CATH 3LUMEN 20C SULFAFREE (CATHETERS) ×2 IMPLANT

## 2019-09-15 NOTE — Consult Note (Addendum)
[place] A. Merlene Laughter, MD     [email or web address]          Alicia Flowers is an 48 y.o. female.   ASSESSMENT/PLAN: 1.  There appears to be two neurological problems at play from an examination standpoint.  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  Potential etiologies includes medication effect, polymyositis, dermatomyositis, sarcoidosis and toxins.  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  Patient will need extensive nerve conduction testing and a needle electromyography to sort this out which will need to be done as an outpatient.  However, given the severely elevated CPK, I recommend high-dose steroids.  Prednisone 100 mg daily is the recommended dose at this time.  Given her cognition however, we may be forced to give her IV formulation.  CPK should be followed daily to assess improvements. 2.  Encephalopathy due to multiple metabolic disturbance. 3.  Multiple metabolic disturbance likely due to a large degree to the severely elevated CPK.    The patient is a 48 year old white female who was sent to the hospital because of altered mental status, increasing ankle edema and weakness on the left side.  The patient provides a history but it is quite unreliable with the patient tells me different accounts on her presentation.  The most consistent account seem to be from the people who brought her into the hospital.  Apparently she has had progressive weakness of the left upper extremity over the last 3 months.  She was taken to the hospital because of significant lethargy, confusion and ankle edema.  The patient herself tells me that the left-sided weakness occurred yesterday although on another occasion she reports  that it has been for over 2 weeks.  There appears to be significant history of alcohol consumption.  Toxicology also positive cocaine but is unclear how often she does this and that the quantity.  The review of systems is limited given the confusion.  GENERAL: The patient appears to be in significant discomfort but in no acute distress.  HEENT: Neck is supple no trauma appreciated.  ABDOMEN: soft  EXTREMITIES: No severe swelling noted at this time.  She does seem to have significant pain in all 4 extremities with manipulation.  BACK: Normal  SKIN: Normal by inspec her medical history.  Tion.    MENTAL STATUS: She is quite drowsy and requires constant stimulation to keep her awake.  She does follow commands with verbal prompting.  She follows both midline and appendicular commands.  Speech is normal although she is clearly confused about her medical history.  CRANIAL NERVES: Pupils are equal, round and reactive to light and accomodation; extra ocular movements are full, there is no significant nystagmus; visual fields are full; upper and lower facial muscles are normal in strength and symmetric, there is no flattening of the nasolabial folds; tongue is midline.  MOTOR: Bulk and tone are normal throughout.  Left deltoid 2/5, triceps 4/5 and handgrip 5.  Right deltoid 3/5, triceps 5 and handgrip 5.  Right hip flexion 4+ and left 4-.  Right dorsiflexion 2/5 and left 3/5.  COORDINATION: Left finger to nose is normal, right finger to nose is normal, No rest tremor; no intention tremor; no postural tremor; no bradykinesia.  REFLEXES: Deep tendon reflexes  are symmetrical and normal in the upper extremities.  1+ at the right knee and 0 at the left.  1+ at the knees ankles.  Plantar reflexes are flexor bilaterally.   SENSATION: Normal to light touch and pain.      Blood pressure (!) 128/102, pulse 92, temperature 98.3 F (36.8 C), resp. rate 19, height 5\' 3"  (1.6 m), weight 78.2 kg, last  menstrual period 09/07/2019, SpO2 97 %.  Past Medical History:  Diagnosis Date   Tennis elbow     Past Surgical History:  Procedure Laterality Date   APPENDECTOMY     TUBAL LIGATION      History reviewed. No pertinent family history.  Social History:  reports that she has been smoking cigarettes. She has been smoking about 0.50 packs per day. She has never used smokeless tobacco. She reports current alcohol use. She reports current drug use. Drug: Marijuana.  Allergies: No Known Allergies  Medications: Prior to Admission medications   Medication Sig Start Date End Date Taking? Authorizing Provider  aspirin 325 MG tablet Take 325 mg by mouth 3 (three) times a week.    Yes [provider]  diphenoxylate-atropine (LOMOTIL) 2.5-0.025 MG per tablet Take 1 tablet by mouth 4 (four) times daily as needed for diarrhea or loose stools. 02/11/14  Yes Idol, Almyra Free, PA-C  naphazoline-glycerin (CLEAR EYES) 0.012-0.2 % SOLN Place 1-2 drops into both eyes every 4 (four) hours as needed. Redness   Yes [provider]  naproxen sodium (ALEVE) 220 MG tablet Take 440 mg by mouth 2 (two) times daily as needed (tennis elbow). Pain   Yes [provider]    Scheduled Meds:  aspirin EC  81 mg Oral Daily   atorvastatin  20 mg Oral q1800   azithromycin  500 mg Oral Daily   Chlorhexidine Gluconate Cloth  6 each Topical 99991111   folic acid  1 mg Oral Daily   heparin injection (subcutaneous)  5,000 Units Subcutaneous Q8H   insulin aspart  0-5 Units Subcutaneous QHS   insulin aspart  0-9 Units Subcutaneous TID WC   multivitamin with minerals  1 tablet Oral Daily   nicotine  14 mg Transdermal Daily   pantoprazole  40 mg Oral Daily   sodium chloride flush  3 mL Intravenous Q12H   sodium chloride flush  3 mL Intravenous Q12H   sodium zirconium cyclosilicate  10 g Oral Daily   thiamine  100 mg Oral Daily   Or   thiamine  100 mg Intravenous Daily   Continuous  Infusions:  sodium chloride     sodium chloride     sodium chloride 75 mL/hr at 09/15/19 1302   calcium gluconate 1,000 mg (09/15/19 1303)   cefTRIAXone (ROCEPHIN)  IV     PRN Meds:.sodium chloride, sodium chloride, LORazepam **OR** LORazepam, ondansetron (ZOFRAN) IV, sodium chloride flush, sodium chloride flush, traMADol     Results for orders placed or performed during the hospital encounter of 09/14/19 (from the past 48 hour(s))  CBC with Differential     Status: Abnormal   Collection Time: 09/14/19  8:19 AM  Result Value Ref Range   WBC 14.4 (H) 4.0 - 10.5 K/uL   RBC 5.15 (H) 3.87 - 5.11 MIL/uL   Hemoglobin 15.7 (H) 12.0 - 15.0 g/dL   HCT 50.9 (H) 36.0 - 46.0 %   MCV 98.8 80.0 - 100.0 fL   MCH 30.5 26.0 - 34.0 pg   MCHC 30.8 30.0 - 36.0 g/dL  RDW 16.0 (H) 11.5 - 15.5 %   Platelets 375 150 - 400 K/uL   nRBC 0.0 0.0 - 0.2 %   Neutrophils Relative % 86 %   Neutro Abs 12.2 (H) 1.7 - 7.7 K/uL   Lymphocytes Relative 4 %   Lymphs Abs 0.6 (L) 0.7 - 4.0 K/uL   Monocytes Relative 9 %   Monocytes Absolute 1.4 (H) 0.1 - 1.0 K/uL   Eosinophils Relative 1 %   Eosinophils Absolute 0.1 0.0 - 0.5 K/uL   Basophils Relative 0 %   Basophils Absolute 0.1 0.0 - 0.1 K/uL   Immature Granulocytes 0 %   Abs Immature Granulocytes 0.06 0.00 - 0.07 K/uL    Comment: Performed at [facility], [address] [place], [place] [address]  Comprehensive metabolic panel     Status: Abnormal   Collection Time: 09/14/19  8:19 AM  Result Value Ref Range   Sodium 136 135 - 145 mmol/L   Potassium 6.6 (HH) 3.5 - 5.1 mmol/L    Comment: CRITICAL RESULT CALLED TO, READ BACK BY AND VERIFIED WITH: WHITE,M AT 8:55AM ON 09/14/19 BY FESTERMAN,C    Chloride 101 98 - 111 mmol/L   CO2 18 (L) 22 - 32 mmol/L   Glucose, Bld 163 (H) 70 - 99 mg/dL   BUN 48 (H) 6 - 20 mg/dL   Creatinine, Ser 4.77 (H) 0.44 - 1.00 mg/dL   Calcium 6.5 (L) 8.9 - 10.3 mg/dL   Total Protein 7.8 6.5 - 8.1 g/dL   Albumin 3.7 3.5 - 5.0  g/dL   AST 2,087 (H) 15 - 41 U/L   ALT 932 (H) 0 - 44 U/L   Alkaline Phosphatase 102 38 - 126 U/L   Total Bilirubin 0.3 0.3 - 1.2 mg/dL   GFR calc non Af Amer 10 (L) >60 mL/min   GFR calc Af Amer 12 (L) >60 mL/min   Anion gap 17 (H) 5 - 15    Comment: Performed at [facility], [address] [place], [place] [address]  Ethanol     Status: None   Collection Time: 09/14/19  8:19 AM  Result Value Ref Range   Alcohol, Ethyl (B) <10 <10 mg/dL    Comment: (NOTE) Lowest detectable limit for serum alcohol is 10 mg/dL. For medical purposes only. Performed at [facility], [address] [place], [place] [address]   Protime-INR     Status: None   Collection Time: 09/14/19  8:19 AM  Result Value Ref Range   Prothrombin Time 13.6 11.4 - 15.2 seconds   INR 1.1 0.8 - 1.2    Comment: (NOTE) INR goal varies based on device and disease states. Performed at [facility], [address] [place], [place] [address]   Urinalysis, Routine w reflex microscopic     Status: Abnormal   Collection Time: 09/14/19  8:41 AM  Result Value Ref Range   Color, Urine AMBER (A) YELLOW   APPearance TURBID (A) CLEAR   Specific Gravity, Urine 1.017 1.005 - 1.030   pH 6.0 5.0 - 8.0   Glucose, UA 50 (A) NEGATIVE mg/dL   Hgb urine dipstick LARGE (A) NEGATIVE   Bilirubin Urine NEGATIVE NEGATIVE   Ketones, ur NEGATIVE NEGATIVE mg/dL   Protein, ur 100 (A) NEGATIVE mg/dL   Nitrite NEGATIVE NEGATIVE   Leukocytes,Ua NEGATIVE NEGATIVE   RBC / HPF 11-20 0 - 5 RBC/hpf   WBC, UA 21-50 0 - 5 WBC/hpf   Bacteria, UA MANY (A) NONE SEEN  Squamous Epithelial / LPF >50 (H) 0 - 5   WBC Clumps PRESENT    Mucus PRESENT    Budding Yeast PRESENT    Amorphous Crystal PRESENT    Non Squamous Epithelial 6-10 (A) NONE SEEN    Comment: Performed at [facility], [address] [place], [place] [address]  Rapid urine drug screen (hospital performed)     Status: Abnormal   Collection Time: 09/14/19  8:41 AM  Result Value Ref Range     Opiates NONE DETECTED NONE DETECTED   Cocaine POSITIVE (A) NONE DETECTED   Benzodiazepines NONE DETECTED NONE DETECTED   Amphetamines NONE DETECTED NONE DETECTED   Tetrahydrocannabinol POSITIVE (A) NONE DETECTED   Barbiturates NONE DETECTED NONE DETECTED    Comment: (NOTE) DRUG SCREEN FOR MEDICAL PURPOSES ONLY.  IF CONFIRMATION IS NEEDED FOR ANY PURPOSE, NOTIFY LAB WITHIN 5 DAYS. LOWEST DETECTABLE LIMITS FOR URINE DRUG SCREEN Drug Class                     Cutoff (ng/mL) Amphetamine and metabolites    1000 Barbiturate and metabolites    200 Benzodiazepine                 [id number] Tricyclics and metabolites     300 Opiates and metabolites        300 Cocaine and metabolites        300 THC                            50 Performed at [facility], [address] [place], [place] [address]   SARS Coronavirus 2 [facility] order, Performed in [facility] hospital lab) Nasopharyngeal Nasopharyngeal Swab     Status: None   Collection Time: 09/14/19 10:04 AM   Specimen: Nasopharyngeal Swab  Result Value Ref Range   SARS Coronavirus 2 NEGATIVE NEGATIVE    Comment: (NOTE) If result is NEGATIVE SARS-CoV-2 target nucleic acids are NOT DETECTED. The SARS-CoV-2 RNA is generally detectable in upper and lower  respiratory specimens during the acute phase of infection. The lowest  concentration of SARS-CoV-2 viral copies this assay can detect is 250  copies / mL. A negative result does not preclude SARS-CoV-2 infection  and should not be used as the sole basis for treatment or other  patient management decisions.  A negative result may occur with  improper specimen collection / handling, submission of specimen other  than nasopharyngeal swab, presence of viral mutation(s) within the  areas targeted by this assay, and inadequate number of viral copies  (<250 copies / mL). A negative result must be combined with clinical  observations, patient history, and epidemiological information. If result is  POSITIVE SARS-CoV-2 target nucleic acids are DETECTED. The SARS-CoV-2 RNA is generally detectable in upper and lower  respiratory specimens dur ing the acute phase of infection.  Positive  results are indicative of active infection with SARS-CoV-2.  Clinical  correlation with patient history and other diagnostic information is  necessary to determine patient infection status.  Positive results do  not rule out bacterial infection or co-infection with other viruses. If result is PRESUMPTIVE POSTIVE SARS-CoV-2 nucleic acids MAY BE PRESENT.   A presumptive positive result was obtained on the submitted specimen  and confirmed on repeat testing.  While 2019 novel coronavirus  (SARS-CoV-2) nucleic acids may be present in the submitted sample  additional confirmatory testing may be necessary for epidemiological  and /  or clinical management purposes  to differentiate between  SARS-CoV-2 and other Sarbecovirus currently known to infect humans.  If clinically indicated additional testing with an alternate test  methodology [phone]) is advised. The SARS-CoV-2 RNA is generally  detectable in upper and lower respiratory sp ecimens during the acute  phase of infection. The expected result is Negative. Fact Sheet for Patients:  StrictlyIdeas.no Fact Sheet for Healthcare Providers: BankingDealers.co.za This test is not yet approved or cleared by the [place] FDA and has been authorized for detection and/or diagnosis of SARS-CoV-2 by FDA under an Emergency Use Authorization (EUA).  This EUA will remain in effect (meaning this test can be used) for the duration of the COVID-19 declaration under Section 564(b)(1) of the Act, 21 U.S.C. section 360bbb-3(b)(1), unless the authorization is terminated or revoked sooner. Performed at [facility], [address], [place], [place] [address]   Magnesium     Status: Abnormal   Collection Time: 09/14/19  11:53 AM  Result Value Ref Range   Magnesium 2.5 (H) 1.7 - 2.4 mg/dL    Comment: Performed at [facility], [address], [place], [place] [address]  Lipase, blood     Status: None   Collection Time: 09/14/19 11:53 AM  Result Value Ref Range   Lipase 27 11 - 51 U/L    Comment: Performed at [facility], [address], [place], [place] [address]  Triglycerides     Status: Abnormal   Collection Time: 09/14/19 11:53 AM  Result Value Ref Range   Triglycerides 177 (H) <150 mg/dL    Comment: Performed at [facility], [address] [place], [place] [address]  Ammonia     Status: None   Collection Time: 09/14/19 11:53 AM  Result Value Ref Range   Ammonia 27 9 - 35 umol/L    Comment: Performed at [facility], [address], [place], [place] [address]  Blood gas, venous     Status: Abnormal   Collection Time: 09/14/19 11:53 AM  Result Value Ref Range   FIO2 32.00    pH, Ven 7.130 (LL) 7.250 - 7.430    Comment: CRITICAL RESULT CALLED TO, READ BACK BY AND VERIFIED WITH: KEMP,C@1306  BY MATTHEWS,B 9.17.2020    pCO2, Ven 55.9 44.0 - 60.0 mmHg   pO2, Ven 38.1 32.0 - 45.0 mmHg   Bicarbonate 14.3 (L) 20.0 - 28.0 mmol/L   Acid-base deficit 9.9 (H) 0.0 - 2.0 mmol/L   O2 Saturation 53.7 %   Patient temperature 36.5     Comment: Performed at [facility], [address], [place], [place] [address]  Troponin I (High Sensitivity)     Status: Abnormal   Collection Time: 09/14/19 11:53 AM  Result Value Ref Range   Troponin I (High Sensitivity) 7,286 (HH) <18 ng/L    Comment: CRITICAL RESULT CALLED TO, READ BACK BY AND VERIFIED WITH: CARDWELL, L@1345  BY MATTEHWS, B 9.17.2020 (NOTE) Elevated high sensitivity troponin I (hsTnI) values and significant  changes across serial measurements may suggest ACS but many other  chronic and acute conditions are known to elevate hsTnI results.  Refer to the Links section for chest pain algorithms and additional  guidance. Performed at [facility], [address], [place], [place] [address]   Brain natriuretic peptide     Status: Abnormal   Collection Time: 09/14/19 11:53 AM  Result Value Ref Range   B Natriuretic Peptide 427.0 (H) 0.0 - 100.0 pg/mL    Comment: Performed at [facility], [address], [place], [place] [address]  Lactic acid, plasma     Status: Abnormal   Collection Time: 09/14/19 11:54 AM  Result Value Ref Range   Lactic Acid, Venous 2.4 (HH) 0.5 - 1.9 mmol/L    Comment: CRITICAL RESULT CALLED TO, READ BACK BY AND VERIFIED WITH: CARDWELL,L@1333  BY MATTHEWS, B 9.17.2020 Performed at [facility], [address] [place], [place] [address]   Hemoglobin A1c     Status: None   Collection Time: 09/14/19 12:08 PM  Result Value Ref Range   Hgb A1c MFr Bld 5.3 4.8 - 5.6 %    Comment: (NOTE)         Prediabetes: 5.7 - 6.4         Diabetes: >6.4         Glycemic control for adults with diabetes: <7.0    Mean Plasma Glucose 105 mg/dL    Comment: (NOTE) Performed At: [facility] [place], [place] [id number] Rush Farmer MD [id number]   Vitamin B12     Status: None   Collection Time: 09/14/19 12:09 PM  Result Value Ref Range   Vitamin B-12 499 180 - 914 pg/mL    Comment: (NOTE) This assay is not validated for testing neonatal or myeloproliferative syndrome specimens for Vitamin B12 levels. Performed at [facility], [address], [place], [place] [address]   TSH     Status: Abnormal   Collection Time: 09/14/19 12:09 PM  Result Value Ref Range   TSH 7.473 (H) 0.350 - 4.500 uIU/mL    Comment: Performed by a 3rd Generation assay with a functional sensitivity of <=0.01 uIU/mL. Performed at [facility], [address], [place], [place] [address]   Hepatitis panel, acute     Status: None   Collection Time: 09/14/19 12:10 PM  Result Value Ref Range   Hepatitis B Surface Ag Negative Negative   HCV Ab <0.1 0.0 - 0.9 s/co ratio    Comment: (NOTE)                                  Negative:     <  0.8                             Indeterminate: 0.8 - 0.9                                  Positive:     > 0.9 The CDC recommends that a positive HCV antibody result be followed up with a HCV Nucleic Acid Amplification test [id number]). Performed At: [facility] [place], [place] [id number] Rush Farmer MD [id number]    Hep A IgM Negative Negative   Hep B C IgM Negative Negative  HIV antibody (Routine Screening)     Status: None   Collection Time: 09/14/19 12:10 PM  Result Value Ref Range   HIV Screen 4th Generation wRfx Non Reactive Non Reactive    Comment: (NOTE) Performed At: [facility] [place], [place] [id number] Rush Farmer MD [id number]   Comprehensive metabolic panel     Status: Abnormal   Collection Time: 09/14/19 12:10 PM  Result Value Ref Range   Sodium 134 (L) 135 - 145 mmol/L   Potassium 6.9 (HH) 3.5 - 5.1 mmol/L  Comment: CRITICAL RESULT CALLED TO, READ BACK BY AND VERIFIED WITH: CARDWELL,L@1344  BY MATTHEWS, B 9.17.2020    Chloride 102 98 - 111 mmol/L   CO2 17 (L) 22 - 32 mmol/L   Glucose, Bld 145 (H) 70 - 99 mg/dL   BUN 51 (H) 6 - 20 mg/dL   Creatinine, Ser 4.70 (H) 0.44 - 1.00 mg/dL   Calcium 6.5 (L) 8.9 - 10.3 mg/dL   Total Protein 7.5 6.5 - 8.1 g/dL   Albumin 3.5 3.5 - 5.0 g/dL   AST 2,046 (H) 15 - 41 U/L   ALT 959 (H) 0 - 44 U/L   Alkaline Phosphatase 108 38 - 126 U/L   Total Bilirubin 0.4 0.3 - 1.2 mg/dL   GFR calc non Af Amer 10 (L) >60 mL/min   GFR calc Af Amer 12 (L) >60 mL/min   Anion gap 15 5 - 15    Comment: Performed at [facility], [address], [place], [place] [address]  Magnesium     Status: Abnormal   Collection Time: 09/14/19 12:10 PM  Result Value Ref Range   Magnesium 2.7 (H) 1.7 - 2.4 mg/dL    Comment: Performed at [facility], [address], [place], [place] [address]  Phosphorus     Status: Abnormal   Collection Time: 09/14/19 12:10 PM  Result Value Ref Range    Phosphorus 10.5 (H) 2.5 - 4.6 mg/dL    Comment: Performed at [facility], [address], [place], [place] [address]  CBC     Status: Abnormal   Collection Time: 09/14/19 12:10 PM  Result Value Ref Range   WBC 13.5 (H) 4.0 - 10.5 K/uL   RBC 4.96 3.87 - 5.11 MIL/uL   Hemoglobin 15.3 (H) 12.0 - 15.0 g/dL   HCT 49.1 (H) 36.0 - 46.0 %   MCV 99.0 80.0 - 100.0 fL   MCH 30.8 26.0 - 34.0 pg   MCHC 31.2 30.0 - 36.0 g/dL   RDW 15.9 (H) 11.5 - 15.5 %   Platelets 329 150 - 400 K/uL   nRBC 0.1 0.0 - 0.2 %    Comment: Performed at [facility], [address] [place], [place] [address]  Glucose, capillary     Status: Abnormal   Collection Time: 09/14/19  4:15 PM  Result Value Ref Range   Glucose-Capillary 163 (H) 70 - 99 mg/dL  C3 complement     Status: None   Collection Time: 09/14/19  5:27 PM  Result Value Ref Range   C3 Complement 90 82 - 167 mg/dL    Comment: (NOTE) Performed At: [facility] [place], [place] [id number] Rush Farmer MD [id number]   C4 complement     Status: None   Collection Time: 09/14/19  5:27 PM  Result Value Ref Range   Complement C4, Body Fluid 25 14 - 44 mg/dL    Comment: (NOTE) Performed At: [facility] [place], [place] [id number] Rush Farmer MD [id number]   Glomerular basement membrane antibodies     Status: None   Collection Time: 09/14/19  5:27 PM  Result Value Ref Range   GBM Ab 4 0 - 20 units    Comment: (NOTE)                   Negative                   0 - 20  Weak Positive             21 - 30                   Moderate to Strong Positive   >30 Performed At: [facility] [place], [place] [id number] Rush Farmer MD [id number]   Antistreptolysin O titer     Status: None   Collection Time: 09/14/19  5:27 PM  Result Value Ref Range   ASO 141.0 0.0 - 200.0 IU/mL    Comment: (NOTE) Performed At: [facility] [place],  [place] [id number] Rush Farmer MD [id number]   Comprehensive metabolic panel     Status: Abnormal   Collection Time: 09/14/19  5:27 PM  Result Value Ref Range   Sodium 135 135 - 145 mmol/L   Potassium 6.5 (HH) 3.5 - 5.1 mmol/L    Comment: CRITICAL RESULT CALLED TO, READ BACK BY AND VERIFIED WITH: JACKSON,N ON 09/14/19 AT 1845 BY LOY,C    Chloride 101 98 - 111 mmol/L   CO2 19 (L) 22 - 32 mmol/L   Glucose, Bld 162 (H) 70 - 99 mg/dL   BUN 56 (H) 6 - 20 mg/dL   Creatinine, Ser 4.97 (H) 0.44 - 1.00 mg/dL   Calcium 6.1 (LL) 8.9 - 10.3 mg/dL    Comment: CRITICAL RESULT CALLED TO, READ BACK BY AND VERIFIED WITH: JACKSON,N ON 09/14/19 AT 1845 BY LOY,C    Total Protein 6.9 6.5 - 8.1 g/dL   Albumin 3.3 (L) 3.5 - 5.0 g/dL   AST 1,780 (H) 15 - 41 U/L   ALT 898 (H) 0 - 44 U/L   Alkaline Phosphatase 131 (H) 38 - 126 U/L   Total Bilirubin 0.3 0.3 - 1.2 mg/dL   GFR calc non Af Amer 10 (L) >60 mL/min   GFR calc Af Amer 11 (L) >60 mL/min   Anion gap 15 5 - 15    Comment: Performed at [facility], [address], [place], [place] [address]  CK     Status: Abnormal   Collection Time: 09/14/19  5:27 PM  Result Value Ref Range   Total CK >50,000 (H) 38 - 234 U/L    Comment: RESULTS CONFIRMED BY MANUAL DILUTION Performed at [facility], [address], [place], [place] [address]   Uric acid     Status: Abnormal   Collection Time: 09/14/19  5:27 PM  Result Value Ref Range   Uric Acid, Serum 12.0 (H) 2.5 - 7.1 mg/dL    Comment: Performed at [facility], [address] [place], [place] [address]  Troponin I (High Sensitivity)     Status: Abnormal   Collection Time: 09/14/19  5:27 PM  Result Value Ref Range   Troponin I (High Sensitivity) 8,025 (HH) <18 ng/L    Comment: CRITICAL RESULT CALLED TO, READ BACK BY AND VERIFIED WITH: JACKSON,N ON 09/14/19 AT 1935 BY LOY,C (NOTE) Elevated high sensitivity troponin I (hsTnI) values and significant  changes across serial measurements may suggest ACS but  many other  chronic and acute conditions are known to elevate hsTnI results.  Refer to the Links section for chest pain algorithms and additional  guidance. Performed at [facility], [address], [place], [place] [address]   Troponin I (High Sensitivity)     Status: Abnormal   Collection Time: 09/14/19  8:53 PM  Result Value Ref Range   Troponin I (High Sensitivity) 6,579 (HH) <18 ng/L  Comment: CRITICAL RESULT CALLED TO, READ BACK BY AND VERIFIED WITH: TETREAULT,H ON 09/14/19 AT 2150 BY LOY,C (NOTE) Elevated high sensitivity troponin I (hsTnI) values and significant  changes across serial measurements may suggest ACS but many other  chronic and acute conditions are known to elevate hsTnI results.  Refer to the Links section for chest pain algorithms and additional  guidance. Performed at [facility] [address] [place], [place] [address]   Glucose, capillary     Status: Abnormal   Collection Time: 09/14/19  9:50 PM  Result Value Ref Range   Glucose-Capillary 132 (H) 70 - 99 mg/dL  Lipid panel     Status: Abnormal   Collection Time: 09/15/19  5:45 AM  Result Value Ref Range   Cholesterol 159 0 - 200 mg/dL   Triglycerides 241 (H) <150 mg/dL   HDL 31 (L) >40 mg/dL   Total CHOL/HDL Ratio 5.1 RATIO   VLDL 48 (H) 0 - 40 mg/dL   LDL Cholesterol 80 0 - 99 mg/dL    Comment:        Total Cholesterol/HDL:CHD Risk Coronary Heart Disease Risk Table                     Men   Women  1/2 Average Risk   3.4   3.3  Average Risk       5.0   4.4  2 X Average Risk   9.6   7.1  3 X Average Risk  23.4   11.0        Use the calculated Patient Ratio above and the CHD Risk Table to determine the patient's CHD Risk.        ATP III CLASSIFICATION (LDL):  <100     mg/dL   Optimal  100-129  mg/dL   Near or Above                    Optimal  130-159  mg/dL   Borderline  160-189  mg/dL   High  >190     mg/dL   Very High Performed at [facility], [address] [place], [place]  [address]   CBC     Status: Abnormal   Collection Time: 09/15/19  5:45 AM  Result Value Ref Range   WBC 13.5 (H) 4.0 - 10.5 K/uL   RBC 4.43 3.87 - 5.11 MIL/uL   Hemoglobin 13.7 12.0 - 15.0 g/dL   HCT 40.5 36.0 - 46.0 %   MCV 91.4 80.0 - 100.0 fL    Comment: REPEATED TO VERIFY DELTA CHECK NOTED    MCH 30.9 26.0 - 34.0 pg   MCHC 33.8 30.0 - 36.0 g/dL   RDW 14.9 11.5 - 15.5 %   Platelets 267 150 - 400 K/uL   nRBC 0.2 0.0 - 0.2 %    Comment: Performed at [facility], [address], [place], [place] [address]  Comprehensive metabolic panel     Status: Abnormal   Collection Time: 09/15/19  5:45 AM  Result Value Ref Range   Sodium 134 (L) 135 - 145 mmol/L   Potassium 6.0 (H) 3.5 - 5.1 mmol/L   Chloride 98 98 - 111 mmol/L   CO2 22 22 - 32 mmol/L   Glucose, Bld 126 (H) 70 - 99 mg/dL   BUN 65 (H) 6 - 20 mg/dL   Creatinine, Ser 5.22 (H) 0.44 - 1.00 mg/dL   Calcium 5.6 (LL) 8.9 - 10.3 mg/dL  Comment: CRITICAL RESULT CALLED TO, READ BACK BY AND VERIFIED WITH: TETREAULT,H AT 6:20AM ON 09/15/19 BY FESTERMAN,C    Total Protein 6.2 (L) 6.5 - 8.1 g/dL   Albumin 2.9 (L) 3.5 - 5.0 g/dL   AST 1,187 (H) 15 - 41 U/L   ALT 709 (H) 0 - 44 U/L   Alkaline Phosphatase 115 38 - 126 U/L   Total Bilirubin 0.3 0.3 - 1.2 mg/dL   GFR calc non Af Amer 9 (L) >60 mL/min   GFR calc Af Amer 11 (L) >60 mL/min   Anion gap 14 5 - 15    Comment: Performed at [facility], [address], [place], [place] [address]  Glucose, capillary     Status: Abnormal   Collection Time: 09/15/19  8:07 AM  Result Value Ref Range   Glucose-Capillary 115 (H) 70 - 99 mg/dL    Studies/Results: CT CHEST ABD IMPRESSION: 1. Airspace consolidation consistent with pneumonia in each posterior lower lobe region. There is a degree of bilateral lower lobe bronchiectasis as well.  2. Stomach distended with air. Gastric wall does not appear appreciably thickened. No small or large bowel distention evident. Question a degree of  ileus from the pneumonia causing the gastric distention.  3. No evident bowel obstruction. No abscess in the abdomen or pelvis. Appendix absent. No periappendiceal region inflammatory change.  4.  Aortic and iliac artery atherosclerosis noted.  5. Osteitis condensans ilia bilaterally, a potential source for abdominal pain.    MRI C SPINE FINDINGS: The study is limited due to patient motion.  Alignment: There is straightening of the normal cervical lordosis.  Vertebrae: The vertebral body heights are well maintained. No fracture, marrow edema,or pathologic marrow infiltration.  Cord: Appears to be normal in signal intensity, however limited due to patient motion.  Posterior Fossa, vertebral arteries, paraspinal tissues:  The visualized portion of the posterior fossa is unremarkable. Normal flow voids seen within the vertebral arteries. The paraspinal soft tissues are unremarkable.  Disc levels:  C1-C2: Atlanto-axial junction is normal, without canal narrowing  C2-C3: No significant spinal canal or neural foraminal narrowing  C3-C4: There is a minimal disc osteophyte complex, however no significant canal or neural foraminal narrowing.  C4-C5: There is a disc osteophyte complex and uncovertebral osteophytes which causes mild left and mild-to-moderate right neural foraminal narrowing.  C5-C6: There is a disc osteophyte complex which causes mild bilateral neural foraminal narrowing.  C6-C7: Disc osteophyte complex which causes mild right neural foraminal narrowing.  C7-T1: No significant spinal canal or neural foraminal narrowing  IMPRESSION: The study is limited due to patient motion.  Mild lumbar spine spondylosis as described above this is most notable C4-C5 with mild-to-moderate right neural foraminal Narrowing.   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 Otherwise, the brain does  not show any old or acute small or large vessel insult. No mass lesion, hemorrhage, hydrocephalus or extra-axial collection.  Vascular: Major vessels at the base of the brain show flow. This includes the posterior circulation vessels. I do not see convincing evidence venous thrombosis. There is an arachnoid granulation at the right transverse sinus sigmoid sinus junction.  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 Otherwise negative study. Etiology indeterminate. There appears to be flow in the posterior circulation main vessels. I do not see clear evidence of venous thrombosis on this noncontrast  study. One could consider CT or MR venography.    The brain MRI are reviewed in person.  There is a small infarct limited to the cortex of the occipital pole on the right side.  It is seen about 3 cuts.  There is 1 or 2 tiny deep white matter lesions which seemed appropriate for age.  On T2 STAR there appears to be reduced signal involving the convexity posterior frontal of unclear significance although this is also seen somewhat on the DWI but on no other scans including the T1 and T2.  This could be artifactual.   Alicia Flowers A. Merlene Laughter, M.D.  Diplomate, Tax adviser of Psychiatry and Neurology ( Neurology). 09/15/2019, 1:38 PM

## 2019-09-15 NOTE — Progress Notes (Signed)
PROGRESS NOTE    Alicia Flowers  [id number] DOB: 05/03/1971 DOA: 09/14/2019 PCP: Patient, No Pcp Per     Brief Narrative:  48 y.o. female with a medical history significant for tobacco abuse, alcohol abuse, cocaine abuse and marijuana use; who presented to the hospital secondary to worsening numbness in her left upper extremity and left leg.  Patient reports his symptoms have been present for approximately 3 months but worsening in the last 2 to 3 days prior to admission.  Patient also expressed feeling slightly more tired and having decrease oral intake.  No chest pain, no nausea, no vomiting, no productive cough, no sick contacts.  Patient reports to continue smoking about half pack per day and is drinking on daily basis.  Last use of marijuana was the night prior to admission.   In the ED patient was found to be mildly hypoxic, with elevated WBCs, acute renal failure with a creatinine close to 5, hyperkalemic, hypophosphatemic with a normal radiologic studies suggesting community-acquired pneumonia.  She also has a positive MRI demonstrating right occipital stroke.  Cultures were taken, sodium bicarbonate, insulin and calcium gluconate provided for hyperkalemia.  Patient is started on IV antibiotics and [facility] has been contacted to admit patient for further evaluation and management.   Assessment & Plan: 1-hypoxia/community-acquired pneumonia -Afebrile -WBCs slightly trending down -Reports no chest pain -Continue flutter valve, continue oxygen supplementation and wean as tolerated -Follow culture results -Continue current antibiotics.  2-acute renal failure -Continue avoiding nephrotoxic agents -Creatinine has continued trending up and the patient now with poor urine output -Following nephrology service recommendations we will give a trial of hemodialysis. -Femoral catheter to be placed by general surgery later on for this trial of dialysis. -Continue supportive care. -Follow  renal function trend. -Patient renal failure appears to be secondary to rhabdomyolysis. -Renal ultrasound without obstructive uropathy.  3-history of alcohol abuse-no active withdrawal appreciated -Continue CIWA protocol -Continue thiamine and folic acid.  4-tobacco abuse, cocaine abuse/marijuana use -Cessation counseling has been provided -Continue nicotine patch  5-right occipital stroke -Patient presenting with left numbness affecting left side -Abnormal electrolytes and renal failure slightly contributed to left side weakness/numbness. -Follow results from dialysis trial -Continue electrolytes repletion as needed -Complete a stroke work-up -Follow neurology service recommendations. -Will continue aspirin for secondary prevention and also statins -LDL of 80 and HDL 31; triglycerides level 241. -A1c 5.3. -Normal B12 level and elevated TSH.  6-elevated TSH/hypothyroidism -start Synthroid medication and repeat thyroid profile in 4-6 weeks.  7-mixed hyperlipidemia -Patient started on Lipitor  8-elevated troponin -No chest pain -No acute ischemic changes appreciated on telemetry or EKG -Reassuring 2D echo with normal ejection fraction and no wall motion abnormalities. -Patient's elevated troponin most likely associated with ongoing rhabdomyolysis and renal failure. -Continue statins and aspirin. -no further acute inpatient cardiac work up.  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 -Alcohol cessation counseling has been provided.   DVT prophylaxis: heparin  Code Status: Full code. Family Communication: No family at bedside. Disposition Plan: Continue monitoring on telemetry bed, plan is to initiate trial of hemodialysis as patient renal function continue to worsen and she is having decreasing urine output.  Will follow up with  nephrology service recommendations.  Continue current antibiotics for community-acquired pneumonia.  Consultants:   Nephrology service  Neurology  Cardiology service has been curbside (Dr. Bronson Ing)  Procedures:   See below  for x-ray reports.  Antimicrobials:  Anti-infectives (From admission, onward)   Start     Dose/Rate Route Frequency Ordered Stop   09/15/19 1230  cefTRIAXone (ROCEPHIN) 2 g in sodium chloride 0.9 % 100 mL IVPB     2 g 200 mL/hr over 30 Minutes Intravenous Every 24 hours 09/14/19 1228 09/20/19 1229   09/15/19 1200  azithromycin (ZITHROMAX) tablet 500 mg     500 mg Oral Daily 09/15/19 1024 09/19/19 0959   09/14/19 1230  cefTRIAXone (ROCEPHIN) 2 g in sodium chloride 0.9 % 100 mL IVPB  Status:  Discontinued     2 g 200 mL/hr over 30 Minutes Intravenous Every 24 hours 09/14/19 1216 09/14/19 1228   09/14/19 1230  azithromycin (ZITHROMAX) 500 mg in sodium chloride 0.9 % 250 mL IVPB  Status:  Discontinued     500 mg 250 mL/hr over 60 Minutes Intravenous Every 24 hours 09/14/19 1216 09/15/19 1024   09/14/19 1230  cefTRIAXone (ROCEPHIN) 1 g in sodium chloride 0.9 % 100 mL IVPB     1 g 200 mL/hr over 30 Minutes Intravenous  Once 09/14/19 1229 09/14/19 1421   09/14/19 1200  doxycycline (VIBRAMYCIN) 100 mg in sodium chloride 0.9 % 250 mL IVPB     100 mg 125 mL/hr over 120 Minutes Intravenous  Once 09/14/19 1157 09/15/19 1011   09/14/19 1100  cefTRIAXone (ROCEPHIN) 1 g in sodium chloride 0.9 % 100 mL IVPB     1 g 200 mL/hr over 30 Minutes Intravenous  Once 09/14/19 1046 09/14/19 1206      Subjective: Afebrile, no chest pain, no nausea, no vomiting.  Reporting lower back discomfort and having poor urine output.  Objective: Vitals:   09/15/19 1400 09/15/19 1627 09/15/19 1637 09/15/19 1645  BP: 119/83 (P) 140/80 (!) 145/80 (!) 142/79  Pulse: 95 (P) 96 95 95  Resp:  (P) 18 18 19   Temp:  (P) 98.4 F (36.9 C)    TempSrc:  (P) Oral    SpO2:  (P) 97%    Weight:   (P) 78.2 kg    Height:        Intake/Output Summary (Last 24 hours) at 09/15/2019 1653 Last data filed at 09/15/2019 1300 Gross per 24 hour  Intake 2109.06 ml  Output 0 ml  Net 2109.06 ml   Filed Weights   09/14/19 0739 09/14/19 1510 09/15/19 1627  Weight: 77.1 kg 78.2 kg (P) 78.2 kg    Examination: General exam: Alert, awake, oriented x 3, denies chest pain, no nausea, no vomiting.  Patient reports feeling a slightly short of breath and expressing lower back discomfort.  Poor urine output reported.  Patient is afebrile. Respiratory system: Decreased breath sounds at the bases, positive rhonchi.  No using accessory muscles. Cardiovascular system:RRR. No murmurs, rubs, gallops.  No JVD on exam. Gastrointestinal system: Abdomen is nondistended, soft and nontender. No organomegaly or masses felt. Normal bowel sounds heard. Central nervous system: Alert and oriented.  Patient reports still left-sided weakness and numbness. Extremities: No cyanosis or clubbing; trace edema appreciated bilaterally. Skin: No rashes, lesions or ulcers Psychiatry:Mood & affect appropriate.    Data Reviewed: I have personally reviewed following labs and imaging studies  CBC: Recent Labs  Lab 09/14/19 0819 09/14/19 1210 09/15/19 0545  WBC 14.4* 13.5* 13.5*  NEUTROABS 12.2*  --   --   HGB 15.7* 15.3* 13.7  HCT 50.9* 49.1* 40.5  MCV 98.8 99.0 91.4  PLT 375 329 [id number]   Basic Metabolic Panel:  Recent Labs  Lab 09/14/19 0819 09/14/19 1153 09/14/19 1210 09/14/19 1727 09/15/19 0545  NA 136  --  134* 135 134*  K 6.6*  --  6.9* 6.5* 6.0*  CL 101  --  102 101 98  CO2 18*  --  17* 19* 22  GLUCOSE 163*  --  145* 162* 126*  BUN 48*  --  51* 56* 65*  CREATININE 4.77*  --  4.70* 4.97* 5.22*  CALCIUM 6.5*  --  6.5* 6.1* 5.6*  MG  --  2.5* 2.7*  --   --   PHOS  --   --  10.5*  --   --    GFR: Estimated Creatinine Clearance: 13.2 mL/min (A) (by C-G formula based on SCr of 5.22 mg/dL (H)).   Liver  Function Tests: Recent Labs  Lab 09/14/19 0819 09/14/19 1210 09/14/19 1727 09/15/19 0545  AST 2,087* 2,046* 1,780* 1,187*  ALT 932* 959* 898* 709*  ALKPHOS 102 108 131* 115  BILITOT 0.3 0.4 0.3 0.3  PROT 7.8 7.5 6.9 6.2*  ALBUMIN 3.7 3.5 3.3* 2.9*   Recent Labs  Lab 09/14/19 1153  LIPASE 27   Recent Labs  Lab 09/14/19 1153  AMMONIA 27   Coagulation Profile: Recent Labs  Lab 09/14/19 0819  INR 1.1   Cardiac Enzymes: Recent Labs  Lab 09/14/19 1727  CKTOTAL >50,000*   HbA1C: Recent Labs    09/14/19 1208  HGBA1C 5.3   CBG: Recent Labs  Lab 09/14/19 1615 09/14/19 2150 09/15/19 0807 09/15/19 1601  GLUCAP 163* 132* 115* 122*   Lipid Profile: Recent Labs    09/14/19 1153 09/15/19 0545  CHOL  --  159  HDL  --  31*  LDLCALC  --  80  TRIG 177* 241*  CHOLHDL  --  5.1   Thyroid Function Tests: Recent Labs    09/14/19 1209  TSH 7.473*   Anemia Panel: Recent Labs    09/14/19 1209  VITAMINB12 499   Urine analysis:    Component Value Date/Time   COLORURINE AMBER (A) 09/14/2019 0841   APPEARANCEUR TURBID (A) 09/14/2019 0841   LABSPEC 1.017 09/14/2019 0841   PHURINE 6.0 09/14/2019 0841   GLUCOSEU 50 (A) 09/14/2019 0841   HGBUR LARGE (A) 09/14/2019 0841   BILIRUBINUR NEGATIVE 09/14/2019 [place] 09/14/2019 0841   PROTEINUR 100 (A) 09/14/2019 0841   UROBILINOGEN 1.0 10/22/2007 1645   NITRITE NEGATIVE 09/14/2019 0841   LEUKOCYTESUR NEGATIVE 09/14/2019 0841    Recent Results (from the past 240 hour(s))  SARS Coronavirus 2 [facility] order, Performed in [facility] hospital lab) Nasopharyngeal Nasopharyngeal Swab     Status: None   Collection Time: 09/14/19 10:04 AM   Specimen: Nasopharyngeal Swab  Result Value Ref Range Status   SARS Coronavirus 2 NEGATIVE NEGATIVE Final    Comment: (NOTE) If result is NEGATIVE SARS-CoV-2 target nucleic acids are NOT DETECTED. The SARS-CoV-2 RNA is generally detectable in upper and lower    respiratory specimens during the acute phase of infection. The lowest  concentration of SARS-CoV-2 viral copies this assay can detect is 250  copies / mL. A negative result does not preclude SARS-CoV-2 infection  and should not be used as the sole basis for treatment or other  patient management decisions.  A negative result may occur with  improper specimen collection / handling, submission of specimen other  than nasopharyngeal swab, presence of viral mutation(s) within the  areas targeted by this assay, and inadequate number  of viral copies  (<250 copies / mL). A negative result must be combined with clinical  observations, patient history, and epidemiological information. If result is POSITIVE SARS-CoV-2 target nucleic acids are DETECTED. The SARS-CoV-2 RNA is generally detectable in upper and lower  respiratory specimens dur ing the acute phase of infection.  Positive  results are indicative of active infection with SARS-CoV-2.  Clinical  correlation with patient history and other diagnostic information is  necessary to determine patient infection status.  Positive results do  not rule out bacterial infection or co-infection with other viruses. If result is PRESUMPTIVE POSTIVE SARS-CoV-2 nucleic acids MAY BE PRESENT.   A presumptive positive result was obtained on the submitted specimen  and confirmed on repeat testing.  While 2019 novel coronavirus  (SARS-CoV-2) nucleic acids may be present in the submitted sample  additional confirmatory testing may be necessary for epidemiological  and / or clinical management purposes  to differentiate between  SARS-CoV-2 and other Sarbecovirus currently known to infect humans.  If clinically indicated additional testing with an alternate test  methodology [phone]) is advised. The SARS-CoV-2 RNA is generally  detectable in upper and lower respiratory sp ecimens during the acute  phase of infection. The expected result is Negative. Fact  Sheet for Patients:  StrictlyIdeas.no Fact Sheet for Healthcare Providers: BankingDealers.co.za This test is not yet approved or cleared by the [place] FDA and has been authorized for detection and/or diagnosis of SARS-CoV-2 by FDA under an Emergency Use Authorization (EUA).  This EUA will remain in effect (meaning this test can be used) for the duration of the COVID-19 declaration under Section 564(b)(1) of the Act, 21 U.S.C. section 360bbb-3(b)(1), unless the authorization is terminated or revoked sooner. Performed at [facility], [address] [place], [place] [address]      Radiology Studies: Ct Abdomen Pelvis Wo Contrast  Result Date: 09/14/2019 CLINICAL DATA:  Abdominal distension with elevated liver enzymes EXAM: CT ABDOMEN AND PELVIS WITHOUT CONTRAST TECHNIQUE: Multidetector CT imaging of the abdomen and pelvis was performed following the standard protocol without oral or IV contrast. COMPARISON:  None. FINDINGS: Lower chest: There is airspace consolidation in both lower lobes posteriorly. There is a degree of lower lobe bronchiectatic change is well. Hepatobiliary: No focal liver lesions are evident on this noncontrast enhanced study. The gallbladder wall is not appreciably thickened. There is no biliary duct dilatation. Pancreas: There is no pancreatic mass or inflammatory focus. Spleen: No splenic lesions are evident. Adrenals/Urinary Tract: Adrenals bilaterally appear unremarkable. Kidneys bilaterally show no evident mass or hydronephrosis on either side. There is no evident renal or ureteral calculus on either side. Urinary bladder is midline with wall thickness within normal limits for degree of distention. Stomach/Bowel: The stomach is diffusely distended with air. There is no appreciable bowel wall or mesenteric thickening. No obstructing focus evident. The terminal ileum appears unremarkable. Vascular/Lymphatic: There is no  abdominal aortic aneurysm. There is aortic and iliac artery atherosclerotic calcification. There is no adenopathy evident in the abdomen or pelvis. Reproductive: Uterus is anteverted.  No pelvic mass is demonstrated. Other: Appendix absent. No periappendiceal region inflammation. No abscess or ascites is evident in the abdomen or pelvis. There is postoperative change in the anterior abdominal wall with several clips present. Musculoskeletal: There are foci of degenerative change in the lumbar spine. 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 No sacroiliitis evident. No blastic or lytic bone lesions. No intramuscular lesions evident. IMPRESSION: 1. Airspace consolidation consistent with pneumonia in each posterior  lower lobe region. There is a degree of bilateral lower lobe bronchiectasis as well. 2. Stomach distended with air. Gastric wall does not appear appreciably thickened. No small or large bowel distention evident. Question a degree of ileus from the pneumonia causing the gastric distention. 3. No evident bowel obstruction. No abscess in the abdomen or pelvis. Appendix absent. No periappendiceal region inflammatory change. 4.  Aortic and iliac artery atherosclerosis noted. 5. Osteitis condensans ilia bilaterally, a potential source for abdominal pain. Electronically Signed   By: Lowella Grip III M.D.   On: 09/14/2019 11:53   Mr Brain Wo Contrast  Result Date: 09/14/2019 CLINICAL DATA:  Numbness of the left arm over the last 3 months. EXAM: MRI HEAD WITHOUT CONTRAST TECHNIQUE: Multiplanar, multiecho pulse sequences of the brain and surrounding structures were obtained without intravenous contrast. COMPARISON:  None. 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 Otherwise, the brain does not show any old or acute small or large vessel insult. No mass lesion, hemorrhage, hydrocephalus or extra-axial collection. Vascular: Major vessels at the base of the brain  show flow. This includes the posterior circulation vessels. I do not see convincing evidence venous thrombosis. There is an arachnoid granulation at the right transverse sinus sigmoid sinus junction. 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 Otherwise negative study. Etiology indeterminate. There appears to be flow in the posterior circulation main vessels. I do not see clear evidence of venous thrombosis on this noncontrast study. One could consider CT or MR venography. Electronically Signed   By: Nelson Chimes M.D.   On: 09/14/2019 10:11   Mr Cervical Spine Wo Contrast  Result Date: 09/14/2019 CLINICAL DATA:  Left arm weakness for 3 months EXAM: MRI CERVICAL SPINE WITHOUT CONTRAST TECHNIQUE: Multiplanar, multisequence MR imaging of the cervical spine was performed. No intravenous contrast was administered. COMPARISON:  None. FINDINGS: The study is limited due to patient motion. Alignment: There is straightening of the normal cervical lordosis. Vertebrae: The vertebral body heights are well maintained. No fracture, marrow edema,or pathologic marrow infiltration. Cord: Appears to be normal in signal intensity, however limited due to patient motion. Posterior Fossa, vertebral arteries, paraspinal tissues: The visualized portion of the posterior fossa is unremarkable. Normal flow voids seen within the vertebral arteries. The paraspinal soft tissues are unremarkable. Disc levels: C1-C2: Atlanto-axial junction is normal, without canal narrowing C2-C3: No significant spinal canal or neural foraminal narrowing C3-C4: There is a minimal disc osteophyte complex, however no significant canal or neural foraminal narrowing. C4-C5: There is a disc osteophyte complex and uncovertebral osteophytes which causes mild left and mild-to-moderate right neural foraminal narrowing. C5-C6: There is a disc osteophyte complex  which causes mild bilateral neural foraminal narrowing. C6-C7: Disc osteophyte complex which causes mild right neural foraminal narrowing. C7-T1: No significant spinal canal or neural foraminal narrowing IMPRESSION: The study is limited due to patient motion. Mild lumbar spine spondylosis as described above this is most notable C4-C5 with mild-to-moderate right neural foraminal narrowing. Electronically Signed   By: Prudencio Pair M.D.   On: 09/14/2019 10:02   US Renal  Result Date: 09/14/2019 CLINICAL DATA:  Acute kidney injury. EXAM: RENAL / URINARY TRACT ULTRASOUND COMPLETE COMPARISON:  None. FINDINGS: Right Kidney: Renal measurements: 11.9 x 4.8 x 4.8 cm = volume: 149 mL. Minimally increased echogenicity of renal parenchyma is noted. No mass or hydronephrosis visualized. Left Kidney: Renal measurements: 12.4  x 5.4 x 4.8 cm = volume: 171 mL. Minimally increased echogenicity of renal parenchyma is noted. No mass or hydronephrosis visualized. Bladder: Not visualized due to body habitus. IMPRESSION: Minimally increased echogenicity of renal parenchyma is noted bilaterally suggesting medical renal disease. No hydronephrosis or renal obstruction is noted. Bladder is not visualized. Electronically Signed   By: Marijo Conception M.D.   On: 09/14/2019 13:28   US Carotid Bilateral  Result Date: 09/14/2019 CLINICAL DATA:  48 year old female with a history of TIA EXAM: BILATERAL CAROTID DUPLEX ULTRASOUND TECHNIQUE: Pearline Cables scale imaging, color Doppler and duplex ultrasound were performed of bilateral carotid and vertebral arteries in the neck. COMPARISON:  None. FINDINGS: Criteria: Quantification of carotid stenosis is based on velocity parameters that correlate the residual internal carotid diameter with NASCET-based stenosis levels, using the diameter of the distal internal carotid lumen as the denominator for stenosis measurement. The following velocity measurements were obtained: RIGHT ICA:  Systolic 123XX123 cm/sec,  Diastolic 51 cm/sec CCA:  123XX123 cm/sec SYSTOLIC ICA/CCA RATIO:  1.8 ECA:  99 cm/sec LEFT ICA:  Systolic 99 cm/sec, Diastolic 52 cm/sec CCA:  123456 cm/sec SYSTOLIC ICA/CCA RATIO:  1.5 ECA: NA Right Brachial SBP: Not acquired Left Brachial SBP: Not acquired RIGHT CAROTID ARTERY: No significant calcified disease of the right common carotid artery. Intermediate waveform maintained. Heterogeneous plaque without significant calcifications at the right carotid bifurcation. Low resistance waveform of the right ICA. No significant tortuosity. RIGHT VERTEBRAL ARTERY: Antegrade flow with low resistance waveform. LEFT CAROTID ARTERY: No significant calcified disease of the left common carotid artery. Intermediate waveform maintained. Heterogeneous plaque at the left carotid bifurcation without significant calcifications. Low resistance waveform of the left ICA. LEFT VERTEBRAL ARTERY:  Antegrade flow with low resistance waveform. IMPRESSION: Color duplex indicates minimal heterogeneous plaque, with no hemodynamically significant stenosis by duplex criteria in the extracranial cerebrovascular circulation. Signed, Dulcy Fanny. Dellia Nims, RPVI [facility] Electronically Signed   By: Corrie Mckusick D.O.   On: 09/14/2019 15:28   Dg Chest Port 1 View  Result Date: 09/14/2019 CLINICAL DATA:  Hypoxia EXAM: PORTABLE CHEST 1 VIEW COMPARISON:  06/25/2013 FINDINGS: Cardiomegaly accentuated by low volumes. Diffuse interstitial prominence. No Kerley lines, air bronchogram, effusion or pneumothorax. Gaseous distention of the stomach. IMPRESSION: 1. Cardiomegaly accentuated by low volumes. 2. Interstitial coarsening, favor vascular congestion. 3. Prominent gaseous distension of the stomach. Electronically Signed   By: Monte Fantasia M.D.   On: 09/14/2019 10:00    Scheduled Meds:  aspirin EC  81 mg Oral Daily   atorvastatin  20 mg Oral q1800   azithromycin  500 mg Oral Daily    Chlorhexidine Gluconate Cloth  6 each Topical Once   And   Chlorhexidine Gluconate Cloth  6 each Topical Once   Chlorhexidine Gluconate Cloth  6 each Topical 99991111   folic acid  1 mg Oral Daily   heparin injection (subcutaneous)  5,000 Units Subcutaneous Q8H   insulin aspart  0-5 Units Subcutaneous QHS   insulin aspart  0-9 Units Subcutaneous TID WC   multivitamin with minerals  1 tablet Oral Daily   nicotine  14 mg Transdermal Daily   pantoprazole  40 mg Oral Daily   sodium chloride flush  3 mL Intravenous Q12H   sodium chloride flush  3 mL Intravenous Q12H   sodium zirconium cyclosilicate  10 g Oral Daily   thiamine  100 mg Oral Daily   Or   thiamine  100 mg  Intravenous Daily   Continuous Infusions:  sodium chloride     sodium chloride     sodium chloride 75 mL/hr at 09/15/19 1302   cefTRIAXone (ROCEPHIN)  IV 2 g (09/15/19 1442)     LOS: 1 day    Time spent: 30 minutes.    Barton Dubois, MD [facility] Pager [phone]   09/15/2019, 4:53 PM

## 2019-09-15 NOTE — TOC Initial Note (Signed)
Transition of Care [facility]) - Initial/Assessment Note    Patient Details  Name: Alicia Flowers MRN: [id number] Date of Birth: [birth year]  Transition of Care [facility]) CM/SW Contact:    Shade Flood, LCSW Phone Number: 09/15/2019, 11:01 AM  Clinical Narrative:                  Pt admitted from home with multiple medical problems. Pt discussed in Progression. Per MD, pt to have femoral line placed and she may end up needing outpt HD if urine production does not return. Pt with history of ETOH and drug abuse. Pt does not have health insurance. MD anticipating hospitalization well into next week.  TOC will follow up Monday to further assess pt's needs and assist with dc planning.  Expected Discharge Plan: Home/Self Care Barriers to Discharge: Continued Medical Work up   Patient Goals and CMS Choice        Expected Discharge Plan and Services Expected Discharge Plan: Home/Self Care In-house Referral: Clinical Social Work     Living arrangements for the past 2 months: Single Family Home                                      Prior Living Arrangements/Services Living arrangements for the past 2 months: Single Family Home Lives with:: Parents Patient language and need for interpreter reviewed:: Yes Do you feel safe going back to the place where you live?: Yes      Need for Family Participation in Patient Care: No (Comment) Care giver support system in place?: Yes (comment)   Criminal Activity/Legal Involvement Pertinent to Current Situation/Hospitalization: No - Comment as needed  Activities of Daily Living Home Assistive Devices/Equipment: None ADL Screening (condition at time of admission) Patient's cognitive ability adequate to safely complete daily activities?: Yes Is the patient deaf or have difficulty hearing?: No Does the patient have difficulty seeing, even when wearing glasses/contacts?: No Does the patient have difficulty concentrating, remembering, or making  decisions?: No Patient able to express need for assistance with ADLs?: Yes Does the patient have difficulty dressing or bathing?: No Independently performs ADLs?: Yes (appropriate for developmental age) Does the patient have difficulty walking or climbing stairs?: No Weakness of Legs: None Weakness of Arms/Hands: None  Permission Sought/Granted                  Emotional Assessment Appearance:: Appears stated age     Orientation: : Oriented to Self, Oriented to Place, Oriented to  Time, Oriented to Situation Alcohol / Substance Use: Alcohol Use, Illicit Drugs Psych Involvement: No (comment)  Admission diagnosis:  Hypocalcemia [E83.51] TIA (transient ischemic attack) [G45.9] Hyperkalemia [E87.5] Hypoxia [R09.02] Left arm weakness [R29.898] Low oxygen saturation [R79.81] AKI (acute kidney injury) ([place]) [N17.9] LFT elevation [R94.5] Acute renal failure, unspecified acute renal failure type ([place]) [N17.9] Community acquired pneumonia, unspecified laterality [J18.9] Patient Active Problem List   Diagnosis Date Noted  . CAP (community acquired pneumonia) 09/14/2019  . Acute renal failure (ARF) ([place]) 09/14/2019  . Hypoxia 09/14/2019  . Alcohol abuse 09/14/2019  . Cocaine abuse ([place]) 09/14/2019  . Hyperkalemia 09/14/2019  . Metabolic acidosis 123XX123  . Hyperphosphatemia 09/14/2019  . Elevated troponin 09/14/2019   PCP:  Patient, No Pcp Per Pharmacy:   [facility] [address], [place]  [address] [place] [address] Phone: [phone] Fax: [phone]  Social Determinants of Health (SDOH) Interventions    Readmission Risk Interventions No flowsheet data found.

## 2019-09-15 NOTE — Progress Notes (Signed)
Noted left arm is red/swollen with a red abrasion area to left upper arm.  patient c/o pain in left shoulder to back and arm.  Mobility is limited, but able to lift arm off of the bed.  Notified MD.  Orders received.

## 2019-09-15 NOTE — Progress Notes (Signed)
Patient has not urinated during shift, states she doesn't feel like she needs to. Bladder scan performed, highest amount shown on scan was 196 mL.

## 2019-09-15 NOTE — Progress Notes (Signed)
Patient ID: [name], female   DOB: 04-12-1971, 48 y.o.   MRN: [id number] S: Somnolent, but arousable.  Underwent placement of right femoral temporary HD catheter O:BP (!) 140/94   Pulse 91   Temp 98.4 F (36.9 C) (Oral)   Resp 16   Ht 5\' 3"  (1.6 m)   Wt 78.2 kg   LMP 09/07/2019   SpO2 92%   BMI 30.54 kg/m   Intake/Output Summary (Last 24 hours) at 09/15/2019 1146 Last data filed at 09/15/2019 0900 Gross per 24 hour  Intake 2189.06 ml  Output 0 ml  Net 2189.06 ml   Intake/Output: I/O last 3 completed shifts: In: 2499.9 [P.O.:120; I.V.:783.9; IV Piggyback:1596] Out: 0   Intake/Output this shift:  Total I/O In: 240 [P.O.:240] Out: -  Weight change:  Gen: somnolent but arousable CVS: no rub Resp: cta with decreased BS at bases Abd: +BS, soft, NT Ext: +trace edema, un-tunneled HD catheter in right femoral area  Recent Labs  Lab 09/14/19 0819 09/14/19 1210 09/14/19 1727 09/15/19 0545  NA 136 134* 135 134*  K 6.6* 6.9* 6.5* 6.0*  CL 101 102 101 98  CO2 18* 17* 19* 22  GLUCOSE 163* 145* 162* 126*  BUN 48* 51* 56* 65*  CREATININE 4.77* 4.70* 4.97* 5.22*  ALBUMIN 3.7 3.5 3.3* 2.9*  CALCIUM 6.5* 6.5* 6.1* 5.6*  PHOS  --  10.5*  --   --   AST 2,087* 2,046* 1,780* 1,187*  ALT 932* 959* 898* 709*   Liver Function Tests: Recent Labs  Lab 09/14/19 1210 09/14/19 1727 09/15/19 0545  AST 2,046* 1,780* 1,187*  ALT 959* 898* 709*  ALKPHOS 108 131* 115  BILITOT 0.4 0.3 0.3  PROT 7.5 6.9 6.2*  ALBUMIN 3.5 3.3* 2.9*   Recent Labs  Lab 09/14/19 1153  LIPASE 27   Recent Labs  Lab 09/14/19 1153  AMMONIA 27   CBC: Recent Labs  Lab 09/14/19 0819 09/14/19 1210 09/15/19 0545  WBC 14.4* 13.5* 13.5*  NEUTROABS 12.2*  --   --   HGB 15.7* 15.3* 13.7  HCT 50.9* 49.1* 40.5  MCV 98.8 99.0 91.4  PLT 375 329 267   Cardiac Enzymes: Recent Labs  Lab 09/14/19 1727  CKTOTAL >50,000*   CBG: Recent Labs  Lab 09/14/19 1615 09/14/19 2150 09/15/19 0807   GLUCAP 163* 132* 115*    Iron Studies: No results for input(s): IRON, TIBC, TRANSFERRIN, FERRITIN in the last 72 hours. Studies/Results: Ct Abdomen Pelvis Wo Contrast  Result Date: 09/14/2019 CLINICAL DATA:  Abdominal distension with elevated liver enzymes EXAM: CT ABDOMEN AND PELVIS WITHOUT CONTRAST TECHNIQUE: Multidetector CT imaging of the abdomen and pelvis was performed following the standard protocol without oral or IV contrast. COMPARISON:  None. FINDINGS: Lower chest: There is airspace consolidation in both lower lobes posteriorly. There is a degree of lower lobe bronchiectatic change is well. Hepatobiliary: No focal liver lesions are evident on this noncontrast enhanced study. The gallbladder wall is not appreciably thickened. There is no biliary duct dilatation. Pancreas: There is no pancreatic mass or inflammatory focus. Spleen: No splenic lesions are evident. Adrenals/Urinary Tract: Adrenals bilaterally appear unremarkable. Kidneys bilaterally show no evident mass or hydronephrosis on either side. There is no evident renal or ureteral calculus on either side. Urinary bladder is midline with wall thickness within normal limits for degree of distention. Stomach/Bowel: The stomach is diffusely distended with air. There is no appreciable bowel wall or mesenteric thickening. No obstructing focus evident. The terminal ileum appears  unremarkable. Vascular/Lymphatic: There is no abdominal aortic aneurysm. There is aortic and iliac artery atherosclerotic calcification. There is no adenopathy evident in the abdomen or pelvis. Reproductive: Uterus is anteverted.  No pelvic mass is demonstrated. Other: Appendix absent. No periappendiceal region inflammation. No abscess or ascites is evident in the abdomen or pelvis. There is postoperative change in the anterior abdominal wall with several clips present. Musculoskeletal: There are foci of degenerative change in the lumbar spine. 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 No sacroiliitis evident. No blastic or lytic bone lesions. No intramuscular lesions evident. IMPRESSION: 1. Airspace consolidation consistent with pneumonia in each posterior lower lobe region. There is a degree of bilateral lower lobe bronchiectasis as well. 2. Stomach distended with air. Gastric wall does not appear appreciably thickened. No small or large bowel distention evident. Question a degree of ileus from the pneumonia causing the gastric distention. 3. No evident bowel obstruction. No abscess in the abdomen or pelvis. Appendix absent. No periappendiceal region inflammatory change. 4.  Aortic and iliac artery atherosclerosis noted. 5. Osteitis condensans ilia bilaterally, a potential source for abdominal pain. Electronically Signed   By: Lowella Grip III M.D.   On: 09/14/2019 11:53   Mr Brain Wo Contrast  Result Date: 09/14/2019 CLINICAL DATA:  Numbness of the left arm over the last 3 months. EXAM: MRI HEAD WITHOUT CONTRAST TECHNIQUE: Multiplanar, multiecho pulse sequences of the brain and surrounding structures were obtained without intravenous contrast. COMPARISON:  None. 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 Otherwise, the brain does not show any old or acute small or large vessel insult. No mass lesion, hemorrhage, hydrocephalus or extra-axial collection. Vascular: Major vessels at the base of the brain show flow. This includes the posterior circulation vessels. I do not see convincing evidence venous thrombosis. There is an arachnoid granulation at the right transverse sinus sigmoid sinus junction. 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 Otherwise negative study. Etiology indeterminate. There appears to be flow in the posterior circulation main vessels. I do not see clear evidence of venous thrombosis on this  noncontrast study. One could consider CT or MR venography. Electronically Signed   By: Nelson Chimes M.D.   On: 09/14/2019 10:11   Mr Cervical Spine Wo Contrast  Result Date: 09/14/2019 CLINICAL DATA:  Left arm weakness for 3 months EXAM: MRI CERVICAL SPINE WITHOUT CONTRAST TECHNIQUE: Multiplanar, multisequence MR imaging of the cervical spine was performed. No intravenous contrast was administered. COMPARISON:  None. FINDINGS: The study is limited due to patient motion. Alignment: There is straightening of the normal cervical lordosis. Vertebrae: The vertebral body heights are well maintained. No fracture, marrow edema,or pathologic marrow infiltration. Cord: Appears to be normal in signal intensity, however limited due to patient motion. Posterior Fossa, vertebral arteries, paraspinal tissues: The visualized portion of the posterior fossa is unremarkable. Normal flow voids seen within the vertebral arteries. The paraspinal soft tissues are unremarkable. Disc levels: C1-C2: Atlanto-axial junction is normal, without canal narrowing C2-C3: No significant spinal canal or neural foraminal narrowing C3-C4: There is a minimal disc osteophyte complex, however no significant canal or neural foraminal narrowing. C4-C5: There is a disc osteophyte complex and uncovertebral osteophytes which causes mild left and mild-to-moderate right neural foraminal narrowing. C5-C6: There is a disc osteophyte complex which causes mild bilateral neural foraminal narrowing. C6-C7: Disc osteophyte complex which causes mild right neural foraminal narrowing.  C7-T1: No significant spinal canal or neural foraminal narrowing IMPRESSION: The study is limited due to patient motion. Mild lumbar spine spondylosis as described above this is most notable C4-C5 with mild-to-moderate right neural foraminal narrowing. Electronically Signed   By: Prudencio Pair M.D.   On: 09/14/2019 10:02   US Renal  Result Date: 09/14/2019 CLINICAL DATA:  Acute kidney  injury. EXAM: RENAL / URINARY TRACT ULTRASOUND COMPLETE COMPARISON:  None. FINDINGS: Right Kidney: Renal measurements: 11.9 x 4.8 x 4.8 cm = volume: 149 mL. Minimally increased echogenicity of renal parenchyma is noted. No mass or hydronephrosis visualized. Left Kidney: Renal measurements: 12.4 x 5.4 x 4.8 cm = volume: 171 mL. Minimally increased echogenicity of renal parenchyma is noted. No mass or hydronephrosis visualized. Bladder: Not visualized due to body habitus. IMPRESSION: Minimally increased echogenicity of renal parenchyma is noted bilaterally suggesting medical renal disease. No hydronephrosis or renal obstruction is noted. Bladder is not visualized. Electronically Signed   By: Marijo Conception M.D.   On: 09/14/2019 13:28   US Carotid Bilateral  Result Date: 09/14/2019 CLINICAL DATA:  48 year old female with a history of TIA EXAM: BILATERAL CAROTID DUPLEX ULTRASOUND TECHNIQUE: Pearline Cables scale imaging, color Doppler and duplex ultrasound were performed of bilateral carotid and vertebral arteries in the neck. COMPARISON:  None. FINDINGS: Criteria: Quantification of carotid stenosis is based on velocity parameters that correlate the residual internal carotid diameter with NASCET-based stenosis levels, using the diameter of the distal internal carotid lumen as the denominator for stenosis measurement. The following velocity measurements were obtained: RIGHT ICA:  Systolic 123XX123 cm/sec, Diastolic 51 cm/sec CCA:  123XX123 cm/sec SYSTOLIC ICA/CCA RATIO:  1.8 ECA:  99 cm/sec LEFT ICA:  Systolic 99 cm/sec, Diastolic 52 cm/sec CCA:  123456 cm/sec SYSTOLIC ICA/CCA RATIO:  1.5 ECA: NA Right Brachial SBP: Not acquired Left Brachial SBP: Not acquired RIGHT CAROTID ARTERY: No significant calcified disease of the right common carotid artery. Intermediate waveform maintained. Heterogeneous plaque without significant calcifications at the right carotid bifurcation. Low resistance waveform of the right ICA. No significant tortuosity.  RIGHT VERTEBRAL ARTERY: Antegrade flow with low resistance waveform. LEFT CAROTID ARTERY: No significant calcified disease of the left common carotid artery. Intermediate waveform maintained. Heterogeneous plaque at the left carotid bifurcation without significant calcifications. Low resistance waveform of the left ICA. LEFT VERTEBRAL ARTERY:  Antegrade flow with low resistance waveform. IMPRESSION: Color duplex indicates minimal heterogeneous plaque, with no hemodynamically significant stenosis by duplex criteria in the extracranial cerebrovascular circulation. Signed, Dulcy Fanny. Dellia Nims, RPVI [facility] Electronically Signed   By: Corrie Mckusick D.O.   On: 09/14/2019 15:28   Dg Chest Port 1 View  Result Date: 09/14/2019 CLINICAL DATA:  Hypoxia EXAM: PORTABLE CHEST 1 VIEW COMPARISON:  06/25/2013 FINDINGS: Cardiomegaly accentuated by low volumes. Diffuse interstitial prominence. No Kerley lines, air bronchogram, effusion or pneumothorax. Gaseous distention of the stomach. IMPRESSION: 1. Cardiomegaly accentuated by low volumes. 2. Interstitial coarsening, favor vascular congestion. 3. Prominent gaseous distension of the stomach. Electronically Signed   By: Monte Fantasia M.D.   On: 09/14/2019 10:00   . [MAR Hold] aspirin EC  81 mg Oral Daily  . [MAR Hold] atorvastatin  20 mg Oral q1800  . [MAR Hold] azithromycin  500 mg Oral Daily  . [MAR Hold] Chlorhexidine Gluconate Cloth  6 each Topical Q0600  . [MAR Hold] folic acid  1 mg Oral Daily  . [MAR Hold] heparin injection (subcutaneous)  5,000 Units Subcutaneous  Q8H  . [MAR Hold] insulin aspart  0-5 Units Subcutaneous QHS  . [MAR Hold] insulin aspart  0-9 Units Subcutaneous TID WC  . [MAR Hold] multivitamin with minerals  1 tablet Oral Daily  . [MAR Hold] nicotine  14 mg Transdermal Daily  . [MAR Hold] pantoprazole  40 mg Oral Daily  . [MAR Hold] sodium chloride flush  3 mL Intravenous Q12H  .  [MAR Hold] sodium chloride flush  3 mL Intravenous Q12H  . [MAR Hold] sodium zirconium cyclosilicate  10 g Oral Daily  . [MAR Hold] thiamine  100 mg Oral Daily   Or  . [facility] Hold] thiamine  100 mg Intravenous Daily    BMET    Component Value Date/Time   NA 134 (L) 09/15/2019 0545   K 6.0 (H) 09/15/2019 0545   CL 98 09/15/2019 0545   CO2 22 09/15/2019 0545   GLUCOSE 126 (H) 09/15/2019 0545   BUN 65 (H) 09/15/2019 0545   CREATININE 5.22 (H) 09/15/2019 0545   CALCIUM 5.6 (LL) 09/15/2019 0545   GFRNONAA 9 (L) 09/15/2019 0545   GFRAA 11 (L) 09/15/2019 0545   CBC    Component Value Date/Time   WBC 13.5 (H) 09/15/2019 0545   RBC 4.43 09/15/2019 0545   HGB 13.7 09/15/2019 0545   HCT 40.5 09/15/2019 0545   PLT 267 09/15/2019 0545   MCV 91.4 09/15/2019 0545   MCH 30.9 09/15/2019 0545   MCHC 33.8 09/15/2019 0545   RDW 14.9 09/15/2019 0545   LYMPHSABS 0.6 (L) 09/14/2019 0819   MONOABS 1.4 (H) 09/14/2019 0819   EOSABS 0.1 09/14/2019 0819   BASOSABS 0.1 09/14/2019 0819     Assessment/Plan: 1.  oliguric AKI- in setting of rhabdomyolysis presumably due to cocaine and alcohol use.  No history of loss of consciousness but unclear.  Renal US without obstruction.  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   1. No uop and rising BUN/Cr.  I discussed with her the need to initiate HD today and she is amenable.  Appreciate Dr. Arnoldo Morale' assistance with HD access. 2. Hopefully she will start to show some signs of renal recovery. 2. Rhabdomyolysis- presumably from cocaine/alcohol and possible LOC.  Follow CK levels. 1. Change IVF's to NS due to low Ca and improved CO2 3. 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 CAP/sepsis- on antibiotics per primary svc 5. Hyperkalemia-   EKG without peaked t-waves and will plan for HD as above.  6. Left arm weakness- possibly related to right cva vs rhabdo, neuro  to evaluate 7. Polysubstance abuse- SW consulted.  Will likely need withdrawal protocol as she admits to drinking daily 8. Abnormal EKG with new partial RBBB- ECHO and Cardiology evaluation pending.  Likely cocaine related.  Donetta Potts, MD [employer] [phone]

## 2019-09-15 NOTE — Progress Notes (Signed)
PT Cancellation Note  Patient Details Name: Alicia Flowers MRN: [id number] DOB: Apr 30, 1971   Cancelled Treatment:    Reason Eval/Treat Not Completed: Medical issues which prohibited therapy.  Patient medically unstable and s/p dialysis catheter insertion.  Will check back when patient's labs are WNLs for functional activity - RN/MD aware.   1:58 PM, 09/15/19 [name], MPT Physical Therapist with [facility] [phone] office [phone] mobile phone

## 2019-09-15 NOTE — Progress Notes (Signed)
MD aware of patient's calcium this morning.

## 2019-09-15 NOTE — Op Note (Signed)
Patient:  Alicia Flowers  DOB:  1971-03-06  MRN:  [id number]   Preop Diagnosis: Acute renal failure  Postop Diagnosis: Same  Procedure: Dialysis catheter insertion  Surgeon: Aviva Signs, MD  Anes: Local  Indications: Patient is a 48 year old black female who was suffering from acute renal failure.  Nephrology is requested dialysis catheter placement for urgent dialysis.  The risks and benefits of the procedure including bleeding and infection were fully explained to the patient, who gave informed consent.  Procedure note: The procedure was performed in the minor procedure room.  Surgical site confirmation was performed.  Mask, gown, gloves, and sterile drape were all used.  The right femoral region was prepped and draped using the usual sterile technique with ChloraPrep.  1% Xylocaine was used for local anesthesia.  The right femoral vein was accessed using the Seldinger technique without difficulty.  A guidewire was then advanced without difficulty.  A dilator then was used over the guidewire.  The dual-lumen dialysis catheter was then inserted over the guidewire and the guidewire was removed.  Good backflow of venous blood was noted in both ports.  Both ports were flushed with saline.  The catheter was secured in place using a 3-0 silk suture.  A dry sterile dressing was applied.  Patient tolerated procedure well.   Complications: None  EBL: Minimal  Specimen: None

## 2019-09-16 LAB — GLUCOSE, CAPILLARY
Glucose-Capillary: 84 mg/dL (ref 70–99)
Glucose-Capillary: 93 mg/dL (ref 70–99)
Glucose-Capillary: 136 mg/dL — ABNORMAL HIGH (ref 70–99)
Glucose-Capillary: 134 mg/dL — ABNORMAL HIGH (ref 70–99)

## 2019-09-16 LAB — MPO/PR-3 (ANCA) ANTIBODIES
ANCA Proteinase 3: <3.5 U/mL (ref 0.0–3.5)
Myeloperoxidase Abs: <9 U/mL (ref 0.0–9.0)

## 2019-09-16 LAB — COMPREHENSIVE METABOLIC PANEL
ALT: 475 U/L — ABNORMAL HIGH (ref 0–44)
AST: 576 U/L — ABNORMAL HIGH (ref 15–41)
Albumin: 2.4 g/dL — ABNORMAL LOW (ref 3.5–5.0)
Alkaline Phosphatase: 98 U/L (ref 38–126)
Anion gap: 13 (ref 5–15)
BUN: 55 mg/dL — ABNORMAL HIGH (ref 6–20)
CO2: 24 mmol/L (ref 22–32)
Calcium: 6.4 mg/dL — CL (ref 8.9–10.3)
Chloride: 101 mmol/L (ref 98–111)
Creatinine, Ser: 4.82 mg/dL — ABNORMAL HIGH (ref 0.44–1.00)
GFR calc Af Amer: 12 mL/min — ABNORMAL LOW (ref >60)
GFR calc non Af Amer: 10 mL/min — ABNORMAL LOW (ref >60)
Glucose, Bld: 94 mg/dL (ref 70–99)
Potassium: 4.9 mmol/L (ref 3.5–5.1)
Sodium: 138 mmol/L (ref 135–145)
Total Bilirubin: 0.7 mg/dL (ref 0.3–1.2)
Total Protein: 5.6 g/dL — ABNORMAL LOW (ref 6.5–8.1)

## 2019-09-16 LAB — URINE CULTURE: Culture: 30000 — AB

## 2019-09-16 LAB — CK: Total CK: 45186 U/L — ABNORMAL HIGH (ref 38–234)

## 2019-09-16 LAB — STREP PNEUMONIAE URINARY ANTIGEN: Strep Pneumo Urinary Antigen: POSITIVE — AB

## 2019-09-16 MED ORDER — HEPARIN SODIUM (PORCINE) 1000 UNIT/ML IJ SOLN
4000.0000 [IU] | Freq: Once | INTRAMUSCULAR | Status: AC
  Administered 2019-09-16: 4000 [IU]
  Filled 2019-09-16: qty 4

## 2019-09-16 MED ORDER — CALCIUM GLUCONATE-NACL 1-0.675 GM/50ML-% IV SOLN
1.0000 g | Freq: Once | INTRAVENOUS | Status: AC
  Administered 2019-09-16: 1000 mg via INTRAVENOUS
  Filled 2019-09-16: qty 50

## 2019-09-16 NOTE — Progress Notes (Signed)
CRITICAL VALUE ALERT  Critical Value:  Calcium 6.4  Date & Time Notied:  09/16/2019 [id number]  Provider Notified: Dr. Dyann Kief  Orders Received/Actions taken: awaiting further orders

## 2019-09-16 NOTE — Progress Notes (Signed)
Placed on continuous pulse ox

## 2019-09-16 NOTE — Evaluation (Signed)
Physical Therapy Evaluation Patient Details Name: Alicia Flowers MRN: [id number] DOB: August 23, 1971 Today's Date: 09/16/2019   History of Present Illness  Patient is a 48 year old female admitted 09/14/2019 with diagnosis of CAP. PMH:  tobacco abuse, alcohol abuse, cocaine abuse and marijuana use. Numbness and weakness in L arm past couple of months. CVA (+).    Clinical Impression  Pt admitted with above diagnosis. Patient agreeable to PT evaluation participation despite being lethargy. Mother present throughout session. Patient required additional time and min assist to come supine to sit with HOB elevate. Patient required mod assist to stand with RW and verbal cues for placement of hands and sequencing of steps. Patient had a difficult time unloading to take steps to the chair initially. Patient required min assist to ambulate 4 feet with RW to chair. Patient complained of dizziness with all upright activities. Mother reported patient would be coming to live with her upon discharge from hospital. Pt currently with functional limitations due to the deficits listed below (see PT Problem List). Pt will benefit from skilled PT to increase their independence and safety with mobility to allow discharge to the venue listed below.       Follow Up Recommendations SNF;Supervision/Assistance - 24 hour    Equipment Recommendations  None recommended by PT    Recommendations for Other Services       Precautions / Restrictions Precautions Precautions: Fall Restrictions Weight Bearing Restrictions: No      Mobility  Bed Mobility Overal bed mobility: Needs Assistance Bed Mobility: Supine to Sit     Supine to sit: HOB elevated;Min assist     General bed mobility comments: complained of dizziness  Transfers Overall transfer level: Needs assistance Equipment used: Rolling walker (2 wheeled) Transfers: Sit to/from [facility] Sit to Stand: Mod assist Stand pivot transfers:  Min assist          Ambulation/Gait Ambulation/Gait assistance: Min assist Gait Distance (Feet): 4 Feet Assistive device: Rolling walker (2 wheeled) Gait Pattern/deviations: Step-to pattern;Decreased step length - right;Decreased step length - left;Decreased stride length;Decreased stance time - left;Decreased weight shift to left;Trunk flexed;Decreased dorsiflexion - left Gait velocity: decreased   General Gait Details: slow, labored cadence from bed to chair. Difficulty unloading to take steps initially. Complained of dizziness.Limited by fatigue.  Stairs            Wheelchair Mobility    Modified Rankin (Stroke Patients Only)       Balance Overall balance assessment: Needs assistance Sitting-balance support: Bilateral upper extremity supported;Feet supported Sitting balance-Leahy Scale: Fair     Standing balance support: Bilateral upper extremity supported;During functional activity Standing balance-Leahy Scale: Fair Standing balance comment: fair/poor with RW                             Pertinent Vitals/Pain Pain Assessment: No/denies pain    Home Living Family/patient expects to be discharged to:: Private residence Living Arrangements: Parent(will be living with mother at discharge.) Available Help at Discharge: Available PRN/intermittently Type of Home: House Home Access: Stairs to enter Entrance Stairs-Rails: Left Entrance Stairs-Number of Steps: 7 Home Layout: One level Home Equipment: Walker - 2 wheels;Bedside commode;Grab bars - tub/shower;Hand held shower head      Prior Function Level of Independence: Independent               Hand Dominance   Dominant Hand: Right    Extremity/Trunk Assessment   Upper  Extremity Assessment Upper Extremity Assessment: Generalized weakness;LUE deficits/detail LUE Deficits / Details: edema noted LUE Sensation: decreased light touch LUE Coordination: decreased fine motor;decreased gross  motor    Lower Extremity Assessment Lower Extremity Assessment: Generalized weakness;LLE deficits/detail LLE Deficits / Details: edema noted LLE Coordination: decreased gross motor       Communication   Communication: No difficulties  Cognition Arousal/Alertness: Lethargic Behavior During Therapy: WFL for tasks assessed/performed Overall Cognitive Status: Within Functional Limits for tasks assessed                                 General Comments: Largely kept eyes closed but would open them when responding to questions appropriately.      General Comments      Exercises     Assessment/Plan    PT Assessment Patient needs continued PT services  PT Problem List Decreased strength;Decreased mobility;Decreased coordination;Decreased activity tolerance;Decreased cognition;Decreased balance;Decreased knowledge of use of DME       PT Treatment Interventions DME instruction;Therapeutic activities;Gait training;Therapeutic exercise;Patient/family education;Balance training    PT Goals (Current goals can be found in the Care Plan section)  Acute Rehab PT Goals Patient Stated Goal: Be able to reach top cupboards without pain. PT Goal Formulation: With patient/family Time For Goal Achievement: 09/30/19 Potential to Achieve Goals: Fair    Frequency Min 2X/week   Barriers to discharge        Co-evaluation               AM-PAC PT "6 Clicks" Mobility  Outcome Measure Help needed turning from your back to your side while in a flat bed without using bedrails?: A Lot Help needed moving from lying on your back to sitting on the side of a flat bed without using bedrails?: A Lot Help needed moving to and from a bed to a chair (including a wheelchair)?: A Lot Help needed standing up from a chair using your arms (e.g., wheelchair or bedside chair)?: A Lot Help needed to walk in hospital room?: A Lot Help needed climbing 3-5 steps with a railing? : A Lot 6 Click  Score: 12    End of Session Equipment Utilized During Treatment: Gait belt Activity Tolerance: Patient limited by fatigue;Patient limited by lethargy Patient left: in chair;with call bell/phone within reach;with nursing/sitter in room;with family/visitor present;with chair alarm set Nurse Communication: Mobility status PT Visit Diagnosis: Unsteadiness on feet (R26.81);Other abnormalities of gait and mobility (R26.89);Muscle weakness (generalized) (M62.81);Hemiplegia and hemiparesis Hemiplegia - Right/Left: Left Hemiplegia - dominant/non-dominant: Non-dominant Hemiplegia - caused by: Cerebral infarction    Time: [id number] PT Time Calculation (min) (ACUTE ONLY): 30 min   Charges:   PT Evaluation $PT Eval Moderate Complexity: 1 Mod PT Treatments $Gait Training: 8-22 mins        Floria Raveling. Hartnett-Rands, MS, PT Per [place] [id number] 09/16/2019, 7:08 PM

## 2019-09-16 NOTE — Progress Notes (Signed)
Patient c/o "arms being numb." Patient able to move BUE without resistance. Can hold drink in either hands but unable to bring cup to mouth with assistance from staff.

## 2019-09-16 NOTE — Progress Notes (Signed)
PROGRESS NOTE    Alicia Flowers  [id number] DOB: [birth year] DOA: 09/14/2019 PCP: Patient, No Pcp Per     Brief Narrative:  48 y.o. female with a medical history significant for tobacco abuse, alcohol abuse, cocaine abuse and marijuana use; who presented to the hospital secondary to worsening numbness in her left upper extremity and left leg.  Patient reports his symptoms have been present for approximately 3 months but worsening in the last 2 to 3 days prior to admission.  Patient also expressed feeling slightly more tired and having decrease oral intake.  No chest pain, no nausea, no vomiting, no productive cough, no sick contacts.  Patient reports to continue smoking about half pack per day and is drinking on daily basis.  Last use of marijuana was the night prior to admission.   In the ED patient was found to be mildly hypoxic, with elevated WBCs, acute renal failure with a creatinine close to 5, hyperkalemic, hypophosphatemic with a normal radiologic studies suggesting community-acquired pneumonia.  She also has a positive MRI demonstrating right occipital stroke.  Cultures were taken, sodium bicarbonate, insulin and calcium gluconate provided for hyperkalemia.  Patient is started on IV antibiotics and [facility] has been contacted to admit patient for further evaluation and management.   Assessment & Plan: 1-hypoxia/community-acquired pneumonia -Afebrile -WBCs slpwly trending down; anticipated confusion information after initiation of steroids as recommended by neurology service. -Reports no chest pain -Continue flutter valve, good oxygen saturation on room air currently.  -Follow culture results -Continue current antibiotics.  2-acute renal failure -Continue avoiding nephrotoxic agents -Creatinine down after HD; still without urine output.  -Femoral catheter in place, tolerated first HD tx on 9/18; second treatment anticipated today (9/19). Follow nephrology service rec's.  -Continue  supportive care. -Follow renal function trend. -Patient renal failure appears to be secondary to rhabdomyolysis. -Renal ultrasound without obstructive uropathy.  3-history of alcohol abuse-no active withdrawal appreciated -Continue CIWA protocol -Continue thiamine and folic acid.  4-tobacco abuse, cocaine abuse/marijuana use -Cessation counseling has been provided -Continue nicotine patch  5-right occipital stroke/concerns for myositis  -Patient presenting with left numbness affecting left side -Abnormal electrolytes and renal failure slightly contributed to left side weakness/numbness. -Follow results from dialysis trial -Continue electrolytes repletion as needed -Followin neurology service recommendations patient has been started on IV steroids.  Outpatient and electromyography recommended. -Will continue aspirin for secondary prevention and also statins -LDL of 80 and HDL 31; triglycerides level 241. -A1c 5.3. -Normal B12 level and elevated TSH.  6-elevated TSH/hypothyroidism -Started on Synthroid and will recommend repeat thyroid profile in 4-6 weeks.  7-mixed hyperlipidemia -Patient started on Lipitor  8-elevated troponin -No chest pain -No acute ischemic changes appreciated on telemetry or EKG -Reassuring 2D echo with normal ejection fraction and no wall motion abnormalities. -Patient's elevated troponin most likely associated with ongoing rhabdomyolysis and renal failure. -Continue statins and aspirin. -no further acute inpatient cardiac work up.  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 -Alcohol cessation counseling has been provided.   DVT prophylaxis: heparin  Code Status: Full code. Family Communication: No family at bedside. Disposition Plan: Continue monitoring on telemetry bed, plan is to repeat  hemodialysis treatment again today.  Will follow up with nephrology service recommendations.  Continue current antibiotics for community-acquired pneumonia.  Consultants:   Nephrology service  Neurology  Cardiology service has been curbside (Dr. Bronson Ing)  Procedures:  See below for x-ray reports.  Antimicrobials:  Anti-infectives (From admission, onward)   Start     Dose/Rate Route Frequency Ordered Stop   09/15/19 1230  cefTRIAXone (ROCEPHIN) 2 g in sodium chloride 0.9 % 100 mL IVPB     2 g 200 mL/hr over 30 Minutes Intravenous Every 24 hours 09/14/19 1228 09/20/19 1229   09/15/19 1200  azithromycin (ZITHROMAX) tablet 500 mg     500 mg Oral Daily 09/15/19 1024 09/19/19 0959   09/14/19 1230  cefTRIAXone (ROCEPHIN) 2 g in sodium chloride 0.9 % 100 mL IVPB  Status:  Discontinued     2 g 200 mL/hr over 30 Minutes Intravenous Every 24 hours 09/14/19 1216 09/14/19 1228   09/14/19 1230  azithromycin (ZITHROMAX) 500 mg in sodium chloride 0.9 % 250 mL IVPB  Status:  Discontinued     500 mg 250 mL/hr over 60 Minutes Intravenous Every 24 hours 09/14/19 1216 09/15/19 1024   09/14/19 1230  cefTRIAXone (ROCEPHIN) 1 g in sodium chloride 0.9 % 100 mL IVPB     1 g 200 mL/hr over 30 Minutes Intravenous  Once 09/14/19 1229 09/14/19 1421   09/14/19 1200  doxycycline (VIBRAMYCIN) 100 mg in sodium chloride 0.9 % 250 mL IVPB     100 mg 125 mL/hr over 120 Minutes Intravenous  Once 09/14/19 1157 09/15/19 1011   09/14/19 1100  cefTRIAXone (ROCEPHIN) 1 g in sodium chloride 0.9 % 100 mL IVPB     1 g 200 mL/hr over 30 Minutes Intravenous  Once 09/14/19 1046 09/14/19 1206      Subjective: No fever, no chest pain, no nausea, no vomiting.  Feeling weak and reporting upper extremities pain bilaterally.  Stable left-sided weakness.  No significant urine output reported.  Tolerated hemodialysis treatment on 09/15/2019  Objective: Vitals:   09/16/19 1200 09/16/19 1215 09/16/19 1230 09/16/19 1245  BP:  120/72 115/72 119/72 115/72  Pulse: 94 94 93 93  Resp:      Temp:      TempSrc:      SpO2:      Weight:      Height:        Intake/Output Summary (Last 24 hours) at 09/16/2019 1338 Last data filed at 09/16/2019 0600 Gross per 24 hour  Intake 1377.9 ml  Output 0 ml  Net 1377.9 ml   Filed Weights   09/14/19 1510 09/15/19 1627 09/16/19 1010  Weight: 78.2 kg 78.2 kg 83 kg    Examination: General exam: Alert, awake, oriented x 3; denies chest pain, no nausea, no vomiting.  Patient reports no visual hallucinations, and expressed improvement in her breathing. Reported bilateral upper extremities pain.  Still without significant urine output. Respiratory system: Positive rhonchi, fair movement bilaterally, no wheezing, no crackles.  No using accessory muscles.   Cardiovascular system:RRR. No murmurs, rubs, gallops. Gastrointestinal system: Abdomen is nondistended, soft and nontender. No organomegaly or masses felt. Normal bowel sounds heard. Central nervous system: Alert and oriented.  Following commands appropriately, no dysarthria, reports left-sided weakness. Extremities: No cyanosis or clubbing; trace edema bilaterally. Skin: No rashes, no open wounds.  Right femoral vein access in place. Psychiatry: Judgement and insight appear normal. Mood & affect appropriate.   Data Reviewed: I have personally reviewed following labs and imaging studies  CBC: Recent Labs  Lab 09/14/19 0819 09/14/19 1210 09/15/19 0545  WBC 14.4* 13.5* 13.5*  NEUTROABS 12.2*  --   --   HGB 15.7* 15.3* 13.7  HCT 50.9* 49.1* 40.5  MCV 98.8 99.0 91.4  PLT 375 329 [id number]   Basic Metabolic Panel: Recent Labs  Lab 09/14/19 0819 09/14/19 1153 09/14/19 1210 09/14/19 1727 09/15/19 0545 09/16/19 0729  NA 136  --  134* 135 134* 138  K 6.6*  --  6.9* 6.5* 6.0* 4.9  CL 101  --  102 101 98 101  CO2 18*  --  17* 19* 22 24  GLUCOSE 163*  --  145* 162* 126* 94  BUN 48*  --  51* 56* 65* 55*  CREATININE 4.77*  --   4.70* 4.97* 5.22* 4.82*  CALCIUM 6.5*  --  6.5* 6.1* 5.6* 6.4*  MG  --  2.5* 2.7*  --   --   --   PHOS  --   --  10.5*  --   --   --    GFR: Estimated Creatinine Clearance: 14.7 mL/min (A) (by C-G formula based on SCr of 4.82 mg/dL (H)).   Liver Function Tests: Recent Labs  Lab 09/14/19 0819 09/14/19 1210 09/14/19 1727 09/15/19 0545 09/16/19 0729  AST 2,087* 2,046* 1,780* 1,187* 576*  ALT 932* 959* 898* 709* 475*  ALKPHOS 102 108 131* 115 98  BILITOT 0.3 0.4 0.3 0.3 0.7  PROT 7.8 7.5 6.9 6.2* 5.6*  ALBUMIN 3.7 3.5 3.3* 2.9* 2.4*   Recent Labs  Lab 09/14/19 1153  LIPASE 27   Recent Labs  Lab 09/14/19 1153  AMMONIA 27   Coagulation Profile: Recent Labs  Lab 09/14/19 0819  INR 1.1   Cardiac Enzymes: Recent Labs  Lab 09/14/19 1727 09/16/19 1135  CKTOTAL >50,000* 45,186*   HbA1C: Recent Labs    09/14/19 1208  HGBA1C 5.3   CBG: Recent Labs  Lab 09/15/19 0807 09/15/19 1601 09/15/19 2126 09/16/19 0730 09/16/19 1124  GLUCAP 115* 122* 101* 84 93   Lipid Profile: Recent Labs    09/14/19 1153 09/15/19 0545  CHOL  --  159  HDL  --  31*  LDLCALC  --  80  TRIG 177* 241*  CHOLHDL  --  5.1   Thyroid Function Tests: Recent Labs    09/14/19 1209  TSH 7.473*   Anemia Panel: Recent Labs    09/14/19 1209  VITAMINB12 499   Urine analysis:    Component Value Date/Time   COLORURINE AMBER (A) 09/14/2019 0841   APPEARANCEUR TURBID (A) 09/14/2019 0841   LABSPEC 1.017 09/14/2019 0841   PHURINE 6.0 09/14/2019 0841   GLUCOSEU 50 (A) 09/14/2019 0841   HGBUR LARGE (A) 09/14/2019 0841   BILIRUBINUR NEGATIVE 09/14/2019 [place] 09/14/2019 0841   PROTEINUR 100 (A) 09/14/2019 0841   UROBILINOGEN 1.0 10/22/2007 1645   NITRITE NEGATIVE 09/14/2019 0841   LEUKOCYTESUR NEGATIVE 09/14/2019 0841    Recent Results (from the past 240 hour(s))  SARS Coronavirus 2 [facility] order, Performed in [facility] hospital lab) Nasopharyngeal  Nasopharyngeal Swab     Status: None   Collection Time: 09/14/19 10:04 AM   Specimen: Nasopharyngeal Swab  Result Value Ref Range Status   SARS Coronavirus 2 NEGATIVE NEGATIVE Final    Comment: (NOTE) If result is NEGATIVE SARS-CoV-2 target nucleic acids are NOT DETECTED. The SARS-CoV-2 RNA is generally detectable in upper and lower  respiratory specimens during the acute phase of infection. The lowest  concentration of SARS-CoV-2 viral copies this assay can detect is 250  copies / mL. A negative result does not preclude SARS-CoV-2 infection  and should not be used as the sole basis for  treatment or other  patient management decisions.  A negative result may occur with  improper specimen collection / handling, submission of specimen other  than nasopharyngeal swab, presence of viral mutation(s) within the  areas targeted by this assay, and inadequate number of viral copies  (<250 copies / mL). A negative result must be combined with clinical  observations, patient history, and epidemiological information. If result is POSITIVE SARS-CoV-2 target nucleic acids are DETECTED. The SARS-CoV-2 RNA is generally detectable in upper and lower  respiratory specimens dur ing the acute phase of infection.  Positive  results are indicative of active infection with SARS-CoV-2.  Clinical  correlation with patient history and other diagnostic information is  necessary to determine patient infection status.  Positive results do  not rule out bacterial infection or co-infection with other viruses. If result is PRESUMPTIVE POSTIVE SARS-CoV-2 nucleic acids MAY BE PRESENT.   A presumptive positive result was obtained on the submitted specimen  and confirmed on repeat testing.  While 2019 novel coronavirus  (SARS-CoV-2) nucleic acids may be present in the submitted sample  additional confirmatory testing may be necessary for epidemiological  and / or clinical management purposes  to differentiate between   SARS-CoV-2 and other Sarbecovirus currently known to infect humans.  If clinically indicated additional testing with an alternate test  methodology [phone]) is advised. The SARS-CoV-2 RNA is generally  detectable in upper and lower respiratory sp ecimens during the acute  phase of infection. The expected result is Negative. Fact Sheet for Patients:  StrictlyIdeas.no Fact Sheet for Healthcare Providers: BankingDealers.co.za This test is not yet approved or cleared by the [place] FDA and has been authorized for detection and/or diagnosis of SARS-CoV-2 by FDA under an Emergency Use Authorization (EUA).  This EUA will remain in effect (meaning this test can be used) for the duration of the COVID-19 declaration under Section 564(b)(1) of the Act, 21 U.S.C. section 360bbb-3(b)(1), unless the authorization is terminated or revoked sooner. Performed at [facility], [address], [place], [place] [address]   MRSA PCR Screening     Status: None   Collection Time: 09/15/19  8:45 AM   Specimen: Nasopharyngeal  Result Value Ref Range Status   MRSA by PCR NEGATIVE NEGATIVE Final    Comment:        The GeneXpert MRSA Assay (FDA approved for NASAL specimens only), is one component of a comprehensive MRSA colonization surveillance program. It is not intended to diagnose MRSA infection nor to guide or monitor treatment for MRSA infections. Performed at [facility], [address] [place], [place] [address]      Radiology Studies: Dg Elbow 2 Views Left  Result Date: 09/15/2019 CLINICAL DATA:  Pain and swelling, stroke, unknown injury EXAM: LEFT SHOULDER - 2+ VIEW; LEFT ELBOW - 2 VIEW COMPARISON:  None. FINDINGS: No fracture or dislocation of the left shoulder. The joint spaces are well preserved. The partially imaged left chest is unremarkable. No fracture or dislocation of the left elbow. Joint spaces are well preserved. No elbow joint  effusion. Diffuse soft tissue edema about the elbow. IMPRESSION: 1. No fracture or dislocation of the left shoulder. The joint spaces are well preserved. 2. No fracture or dislocation of the left elbow. Joint spaces are well preserved. No elbow joint effusion. Diffuse soft tissue edema about the elbow. Electronically Signed   By: Eddie Candle M.D.   On: 09/15/2019 21:50   Dg Shoulder Left  Result Date: 09/15/2019 CLINICAL DATA:  Pain  and swelling, stroke, unknown injury EXAM: LEFT SHOULDER - 2+ VIEW; LEFT ELBOW - 2 VIEW COMPARISON:  None. FINDINGS: No fracture or dislocation of the left shoulder. The joint spaces are well preserved. The partially imaged left chest is unremarkable. No fracture or dislocation of the left elbow. Joint spaces are well preserved. No elbow joint effusion. Diffuse soft tissue edema about the elbow. IMPRESSION: 1. No fracture or dislocation of the left shoulder. The joint spaces are well preserved. 2. No fracture or dislocation of the left elbow. Joint spaces are well preserved. No elbow joint effusion. Diffuse soft tissue edema about the elbow. Electronically Signed   By: Eddie Candle M.D.   On: 09/15/2019 21:50    Scheduled Meds: . aspirin EC  81 mg Oral Daily  . atorvastatin  20 mg Oral q1800  . azithromycin  500 mg Oral Daily  . Chlorhexidine Gluconate Cloth  6 each Topical Once   And  . Chlorhexidine Gluconate Cloth  6 each Topical Once  . Chlorhexidine Gluconate Cloth  6 each Topical Q0600  . folic acid  1 mg Oral Daily  . heparin injection (subcutaneous)  5,000 Units Subcutaneous Q8H  . insulin aspart  0-5 Units Subcutaneous QHS  . insulin aspart  0-9 Units Subcutaneous TID WC  . levothyroxine  50 mcg Oral Q0600  . multivitamin with minerals  1 tablet Oral Daily  . nicotine  14 mg Transdermal Daily  . pantoprazole  40 mg Oral Daily  . sodium chloride flush  3 mL Intravenous Q12H  . sodium chloride flush  3 mL Intravenous Q12H  . sodium zirconium cyclosilicate   10 g Oral Daily  . thiamine  100 mg Oral Daily   Or  . thiamine  100 mg Intravenous Daily   Continuous Infusions: . sodium chloride    . sodium chloride    . sodium chloride 75 mL/hr at 09/16/19 0357  . cefTRIAXone (ROCEPHIN)  IV 2 g (09/15/19 1442)  . methylPREDNISolone (SOLU-MEDROL) injection 500 mg (09/16/19 1051)     LOS: 2 days    Time spent: 30 minutes.    Barton Dubois, MD [facility] Pager [phone]   09/16/2019, 1:37 PM

## 2019-09-16 NOTE — Plan of Care (Signed)
  Problem: Acute Rehab PT Goals(only PT should resolve) Goal: Pt will Roll Supine to Side Outcome: Progressing Goal: Pt Will Go Supine/Side To Sit Outcome: Progressing Goal: Pt Will Go Sit To Supine/Side Outcome: Progressing Goal: Patient Will Transfer Sit To/From Stand Outcome: Progressing Goal: Pt Will Transfer Bed To Chair/Chair To Bed Outcome: Progressing Goal: Pt Will Ambulate Outcome: Progressing   Floria Raveling. Hartnett-Rands, MS, PT Per [place] [phone] 09/16/2019

## 2019-09-16 NOTE — Progress Notes (Signed)
PT Cancellation Note  Patient Details Name: Alicia Flowers MRN: [id number] DOB: [birth year]   Cancelled Treatment:    Reason Eval/Treat Not Completed: Patient at procedure or test/unavailable. Patient currently on dialysis. Will follow up tomorrow.   Floria Raveling. Hartnett-Rands, MS, PT Per [place] [id number] 09/16/2019, 12:56 PM

## 2019-09-16 NOTE — Progress Notes (Addendum)
Patient ID: Alicia Flowers, female   DOB: 1971/08/09, 48 y.o.   MRN: [id number]   S: Sleeping, easily arousable, reports being thirsty  O:BP 130/72 (BP Location: Right Arm)   Pulse 94   Temp 98 F (36.7 C) (Oral)   Resp 18   Ht 5\' 3"  (1.6 m)   Wt 82 kg   LMP 09/07/2019   SpO2 98%   BMI 32.02 kg/m   Intake/Output Summary (Last 24 hours) at 09/16/2019 1407 Last data filed at 09/16/2019 1319 Gross per 24 hour  Intake 1377.9 ml  Output 1000 ml  Net 377.9 ml   Intake/Output: I/O last 3 completed shifts: In: [id number] [P.O.:600; I.V.:1941.8; IV Piggyback:945.2] Out: 0   Intake/Output this shift:  Total I/O In: -  Out: 1000 [Other:1000] Weight change: 5.625 kg Gen: NAD, lying in bed HEENT: EOMI PERRL CVS: RRR no m/r/g Resp: cta with decreased BS at bases Abd: +BS, soft, NT Ext: +trace edema, un-tunneled HD catheter in right femoral area  Recent Labs  Lab 09/14/19 0819 09/14/19 1210 09/14/19 1727 09/15/19 0545 09/16/19 0729  NA 136 134* 135 134* 138  K 6.6* 6.9* 6.5* 6.0* 4.9  CL 101 102 101 98 101  CO2 18* 17* 19* 22 24  GLUCOSE 163* 145* 162* 126* 94  BUN 48* 51* 56* 65* 55*  CREATININE 4.77* 4.70* 4.97* 5.22* 4.82*  ALBUMIN 3.7 3.5 3.3* 2.9* 2.4*  CALCIUM 6.5* 6.5* 6.1* 5.6* 6.4*  PHOS  --  10.5*  --   --   --   AST 2,087* 2,046* 1,780* 1,187* 576*  ALT 932* 959* 898* 709* 475*   Liver Function Tests: Recent Labs  Lab 09/14/19 1727 09/15/19 0545 09/16/19 0729  AST 1,780* 1,187* 576*  ALT 898* 709* 475*  ALKPHOS 131* 115 98  BILITOT 0.3 0.3 0.7  PROT 6.9 6.2* 5.6*  ALBUMIN 3.3* 2.9* 2.4*   Recent Labs  Lab 09/14/19 1153  LIPASE 27   Recent Labs  Lab 09/14/19 1153  AMMONIA 27   CBC: Recent Labs  Lab 09/14/19 0819 09/14/19 1210 09/15/19 0545  WBC 14.4* 13.5* 13.5*  NEUTROABS 12.2*  --   --   HGB 15.7* 15.3* 13.7  HCT 50.9* 49.1* 40.5  MCV 98.8 99.0 91.4  PLT 375 329 267   Cardiac Enzymes: Recent Labs  Lab 09/14/19 1727 09/16/19 1135   CKTOTAL >50,000* 45,186*   CBG: Recent Labs  Lab 09/15/19 0807 09/15/19 1601 09/15/19 2126 09/16/19 0730 09/16/19 1124  GLUCAP 115* 122* 101* 84 93    Iron Studies: No results for input(s): IRON, TIBC, TRANSFERRIN, FERRITIN in the last 72 hours. Studies/Results: Dg Elbow 2 Views Left  Result Date: 09/15/2019 CLINICAL DATA:  Pain and swelling, stroke, unknown injury EXAM: LEFT SHOULDER - 2+ VIEW; LEFT ELBOW - 2 VIEW COMPARISON:  None. FINDINGS: No fracture or dislocation of the left shoulder. The joint spaces are well preserved. The partially imaged left chest is unremarkable. No fracture or dislocation of the left elbow. Joint spaces are well preserved. No elbow joint effusion. Diffuse soft tissue edema about the elbow. IMPRESSION: 1. No fracture or dislocation of the left shoulder. The joint spaces are well preserved. 2. No fracture or dislocation of the left elbow. Joint spaces are well preserved. No elbow joint effusion. Diffuse soft tissue edema about the elbow. Electronically Signed   By: Eddie Candle M.D.   On: 09/15/2019 21:50   Dg Shoulder Left  Result Date: 09/15/2019 CLINICAL DATA:  Pain and  swelling, stroke, unknown injury EXAM: LEFT SHOULDER - 2+ VIEW; LEFT ELBOW - 2 VIEW COMPARISON:  None. FINDINGS: No fracture or dislocation of the left shoulder. The joint spaces are well preserved. The partially imaged left chest is unremarkable. No fracture or dislocation of the left elbow. Joint spaces are well preserved. No elbow joint effusion. Diffuse soft tissue edema about the elbow. IMPRESSION: 1. No fracture or dislocation of the left shoulder. The joint spaces are well preserved. 2. No fracture or dislocation of the left elbow. Joint spaces are well preserved. No elbow joint effusion. Diffuse soft tissue edema about the elbow. Electronically Signed   By: Eddie Candle M.D.   On: 09/15/2019 21:50   . aspirin EC  81 mg Oral Daily  . atorvastatin  20 mg Oral q1800  . azithromycin  500  mg Oral Daily  . Chlorhexidine Gluconate Cloth  6 each Topical Once   And  . Chlorhexidine Gluconate Cloth  6 each Topical Once  . Chlorhexidine Gluconate Cloth  6 each Topical Q0600  . folic acid  1 mg Oral Daily  . heparin injection (subcutaneous)  5,000 Units Subcutaneous Q8H  . insulin aspart  0-5 Units Subcutaneous QHS  . insulin aspart  0-9 Units Subcutaneous TID WC  . levothyroxine  50 mcg Oral Q0600  . multivitamin with minerals  1 tablet Oral Daily  . nicotine  14 mg Transdermal Daily  . pantoprazole  40 mg Oral Daily  . sodium chloride flush  3 mL Intravenous Q12H  . sodium chloride flush  3 mL Intravenous Q12H  . sodium zirconium cyclosilicate  10 g Oral Daily  . thiamine  100 mg Oral Daily   Or  . thiamine  100 mg Intravenous Daily    BMET    Component Value Date/Time   NA 138 09/16/2019 0729   K 4.9 09/16/2019 0729   CL 101 09/16/2019 0729   CO2 24 09/16/2019 0729   GLUCOSE 94 09/16/2019 0729   BUN 55 (H) 09/16/2019 0729   CREATININE 4.82 (H) 09/16/2019 0729   CALCIUM 6.4 (LL) 09/16/2019 0729   GFRNONAA 10 (L) 09/16/2019 0729   GFRAA 12 (L) 09/16/2019 0729   CBC    Component Value Date/Time   WBC 13.5 (H) 09/15/2019 0545   RBC 4.43 09/15/2019 0545   HGB 13.7 09/15/2019 0545   HCT 40.5 09/15/2019 0545   PLT 267 09/15/2019 0545   MCV 91.4 09/15/2019 0545   MCH 30.9 09/15/2019 0545   MCHC 33.8 09/15/2019 0545   RDW 14.9 09/15/2019 0545   LYMPHSABS 0.6 (L) 09/14/2019 0819   MONOABS 1.4 (H) 09/14/2019 0819   EOSABS 0.1 09/14/2019 0819   BASOSABS 0.1 09/14/2019 0819     Assessment/Plan: 1. Oliguric AKI- in setting of rhabdomyolysis presumably due to cocaine and alcohol use.  No history of loss of consciousness but unclear.  Renal US without obstruction.  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  ANA +, complements WNL, antiDNA antibody negative, ASO titer, and  anti GBM normal.   1. No uop and rising BUN/Cr. HD yesterday and today. Appreciate Dr. Arnoldo Morale' assistance with HD access.  Next planned for Monday 2. Hopefully she will start to show some signs of renal recovery. 2. Rhabdomyolysis- presumably from cocaine/alcohol and possible LOC.  Follow CK levels. 1. Change IVF's to NS due to low Ca and improved CO2 3. Right occipital infarct- neuro to  evaluate 4. CAP/sepsis- on antibiotics per primary svc 5. Hyperkalemia-   EKG without peaked t-waves and will plan for HD as above.  6. Left arm weakness- possibly related to right cva vs rhabdo, neuro to evaluate 7. Polysubstance abuse- SW consulted.  Will likely need withdrawal protocol as she admits to drinking daily 8. Abnormal EKG with new partial RBBB- ECHO and Cardiology evaluation pending.  Likely cocaine related.   Madelon Lips MD [facility] pgr [phone]

## 2019-09-16 NOTE — Progress Notes (Signed)
PT got patient into chair and there was moderate bleeding on the chux pad underneath her. Patient says her LMP was last week. Dr. Dyann Kief made aware. Patient's mother at bedside, will update oncoming shift.

## 2019-09-16 NOTE — Progress Notes (Signed)
Notified mid-level of patient bleeding from pubic region.  Dialysis catheter intact, and vitals stable.  Received order for lab draw.  Patient resting in bed with no complaints.

## 2019-09-17 ENCOUNTER — Inpatient Hospital Stay (HOSPITAL_COMMUNITY): Payer: Medicaid Other

## 2019-09-17 DIAGNOSIS — M25422 Effusion, left elbow: Secondary | ICD-10-CM

## 2019-09-17 LAB — CBC WITH DIFFERENTIAL/PLATELET
Abs Immature Granulocytes: 0.07 10*3/uL (ref 0.00–0.07)
Basophils Absolute: 0 10*3/uL (ref 0.0–0.1)
Basophils Relative: 0 %
Eosinophils Absolute: 0 10*3/uL (ref 0.0–0.5)
Eosinophils Relative: 0 %
HCT: 34.6 % — ABNORMAL LOW (ref 36.0–46.0)
Hemoglobin: 11.7 g/dL — ABNORMAL LOW (ref 12.0–15.0)
Immature Granulocytes: 1 %
Lymphocytes Relative: 4 %
Lymphs Abs: 0.6 10*3/uL — ABNORMAL LOW (ref 0.7–4.0)
MCH: 30.8 pg (ref 26.0–34.0)
MCHC: 33.8 g/dL (ref 30.0–36.0)
MCV: 91.1 fL (ref 80.0–100.0)
Monocytes Absolute: 0.4 10*3/uL (ref 0.1–1.0)
Monocytes Relative: 2 %
Neutro Abs: 13.8 10*3/uL — ABNORMAL HIGH (ref 1.7–7.7)
Neutrophils Relative %: 93 %
Platelets: 176 10*3/uL (ref 150–400)
RBC: 3.8 MIL/uL — ABNORMAL LOW (ref 3.87–5.11)
RDW: 15.1 % (ref 11.5–15.5)
WBC: 14.8 10*3/uL — ABNORMAL HIGH (ref 4.0–10.5)
nRBC: 0.1 % (ref 0.0–0.2)

## 2019-09-17 LAB — GLUCOSE, CAPILLARY
Glucose-Capillary: 144 mg/dL — ABNORMAL HIGH (ref 70–99)
Glucose-Capillary: 148 mg/dL — ABNORMAL HIGH (ref 70–99)
Glucose-Capillary: 134 mg/dL — ABNORMAL HIGH (ref 70–99)
Glucose-Capillary: 147 mg/dL — ABNORMAL HIGH (ref 70–99)

## 2019-09-17 LAB — COMPREHENSIVE METABOLIC PANEL
ALT: 393 U/L — ABNORMAL HIGH (ref 0–44)
AST: 431 U/L — ABNORMAL HIGH (ref 15–41)
Albumin: 2.5 g/dL — ABNORMAL LOW (ref 3.5–5.0)
Alkaline Phosphatase: 84 U/L (ref 38–126)
Anion gap: 13 (ref 5–15)
BUN: 53 mg/dL — ABNORMAL HIGH (ref 6–20)
CO2: 22 mmol/L (ref 22–32)
Calcium: 7.2 mg/dL — ABNORMAL LOW (ref 8.9–10.3)
Chloride: 99 mmol/L (ref 98–111)
Creatinine, Ser: 4.77 mg/dL — ABNORMAL HIGH (ref 0.44–1.00)
GFR calc Af Amer: 12 mL/min — ABNORMAL LOW (ref >60)
GFR calc non Af Amer: 10 mL/min — ABNORMAL LOW (ref >60)
Glucose, Bld: 137 mg/dL — ABNORMAL HIGH (ref 70–99)
Potassium: 3.9 mmol/L (ref 3.5–5.1)
Sodium: 134 mmol/L — ABNORMAL LOW (ref 135–145)
Total Bilirubin: 0.5 mg/dL (ref 0.3–1.2)
Total Protein: 5.7 g/dL — ABNORMAL LOW (ref 6.5–8.1)

## 2019-09-17 LAB — CK: Total CK: 24838 U/L — ABNORMAL HIGH (ref 38–234)

## 2019-09-17 NOTE — Progress Notes (Signed)
Patient c/o left arm pain.  Patient's arm is swollen, but has been swollen since admission according to patient.  Patient has capillary refill in fingers and able to bend arm. Top of patient's arm is the most swollen.  Arm elevated and heat packs applied to arm.

## 2019-09-17 NOTE — Progress Notes (Signed)
PROGRESS NOTE    Alicia Flowers  [id number] DOB: [birth year] DOA: 09/14/2019 PCP: Patient, No Pcp Per     Brief Narrative:  48 y.o. female with a medical history significant for tobacco abuse, alcohol abuse, cocaine abuse and marijuana use; who presented to the hospital secondary to worsening numbness in her left upper extremity and left leg.  Patient reports his symptoms have been present for approximately 3 months but worsening in the last 2 to 3 days prior to admission.  Patient also expressed feeling slightly more tired and having decrease oral intake.  No chest pain, no nausea, no vomiting, no productive cough, no sick contacts.  Patient reports to continue smoking about half pack per day and is drinking on daily basis.  Last use of marijuana was the night prior to admission.   In the ED patient was found to be mildly hypoxic, with elevated WBCs, acute renal failure with a creatinine close to 5, hyperkalemic, hypophosphatemic with a normal radiologic studies suggesting community-acquired pneumonia.  She also has a positive MRI demonstrating right occipital stroke.  Cultures were taken, sodium bicarbonate, insulin and calcium gluconate provided for hyperkalemia.  Patient is started on IV antibiotics and [facility] has been contacted to admit patient for further evaluation and management.   Assessment & Plan: 1-hypoxia/community-acquired pneumonia -Afebrile -WBCs were slowly trending down; anticipated confusion information after initiation of steroids as recommended by neurology service. -Reports no chest pain -Continue flutter valve, good oxygen saturation on room air currently.  -Follow culture results -Continue current antibiotics.  2-acute renal failure/hyperkalemiahypocalcemia -Continue avoiding nephrotoxic agents -patient remains anuric. -Creatinine down after HD; still without urine output.  -Femoral catheter in place, tolerated first HD tx on 9/18; second treatment provided on  (9/19); most likely HD again on 9/21. Will follow nephrology service rec's.  -Continue supportive care. -Follow renal function trend. -Patient renal failure appears to be secondary to rhabdomyolysis. -Renal ultrasound without obstructive uropathy. -K 3.9; will discontinue lokelma today.  -Ca 7.2  3-history of alcohol abuse-no active withdrawal appreciated -Continue CIWA protocol -Continue thiamine and folic acid.  4-tobacco abuse, cocaine abuse/marijuana use -Cessation counseling has been provided -Continue nicotine patch  5-right occipital stroke/concerns for myositis  -Patient presenting with left numbness affecting left side -Abnormal electrolytes and renal failure slightly contributed to left side weakness/numbness. -Follow results from dialysis trial -Continue electrolytes repletion as needed -Followin neurology service recommendations patient has been started on IV steroids.  Outpatient and electromyography recommended. -Will continue aspirin for secondary prevention and also statins -LDL of 80 and HDL 31; triglycerides level 241. -A1c 5.3. -Normal B12 level and elevated TSH. -CK level trending down  6-elevated TSH/hypothyroidism -Started on Synthroid and will recommend repeat thyroid profile in 4-6 weeks.  7-mixed hyperlipidemia -Patient started on Lipitor  8-elevated troponin -No chest pain -No acute ischemic changes appreciated on telemetry or EKG -Reassuring 2D echo with normal ejection fraction and no wall motion abnormalities. -Patient's elevated troponin most likely associated with ongoing rhabdomyolysis and renal failure. -Continue statins and aspirin. -no further acute inpatient cardiac work up.  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 -Alcohol cessation counseling has been  provided.   DVT prophylaxis: heparin  Code Status: Full code. Family Communication: No family at bedside. Disposition Plan: Continue monitoring on telemetry bed, plan is to repeat hemodialysis treatment on 9/21 most likely.  Will  follow up with nephrology service recommendations.  Continue current antibiotics for community-acquired pneumonia.  Consultants:   Nephrology service  Neurology  Cardiology service has been curbside (Dr. Bronson Ing)  Procedures:   See below for x-ray reports.  Antimicrobials:  Anti-infectives (From admission, onward)   Start     Dose/Rate Route Frequency Ordered Stop   09/15/19 1230  cefTRIAXone (ROCEPHIN) 2 g in sodium chloride 0.9 % 100 mL IVPB     2 g 200 mL/hr over 30 Minutes Intravenous Every 24 hours 09/14/19 1228 09/20/19 1229   09/15/19 1200  azithromycin (ZITHROMAX) tablet 500 mg     500 mg Oral Daily 09/15/19 1024 09/19/19 0959   09/14/19 1230  cefTRIAXone (ROCEPHIN) 2 g in sodium chloride 0.9 % 100 mL IVPB  Status:  Discontinued     2 g 200 mL/hr over 30 Minutes Intravenous Every 24 hours 09/14/19 1216 09/14/19 1228   09/14/19 1230  azithromycin (ZITHROMAX) 500 mg in sodium chloride 0.9 % 250 mL IVPB  Status:  Discontinued     500 mg 250 mL/hr over 60 Minutes Intravenous Every 24 hours 09/14/19 1216 09/15/19 1024   09/14/19 1230  cefTRIAXone (ROCEPHIN) 1 g in sodium chloride 0.9 % 100 mL IVPB     1 g 200 mL/hr over 30 Minutes Intravenous  Once 09/14/19 1229 09/14/19 1421   09/14/19 1200  doxycycline (VIBRAMYCIN) 100 mg in sodium chloride 0.9 % 250 mL IVPB     100 mg 125 mL/hr over 120 Minutes Intravenous  Once 09/14/19 1157 09/15/19 1011   09/14/19 1100  cefTRIAXone (ROCEPHIN) 1 g in sodium chloride 0.9 % 100 mL IVPB     1 g 200 mL/hr over 30 Minutes Intravenous  Once 09/14/19 1046 09/14/19 1206      Subjective: No fever, no chest pain, no nausea, no vomiting.  Complaining of left upper extremity pain/swelling.  Improved air movement  bilaterally and no requiring oxygen supplementation.  Objective: Vitals:   09/16/19 1300 09/16/19 1319 09/16/19 2031 09/17/19 0542  BP: 120/72 130/72 (!) 159/90 (!) 162/98  Pulse: 92 94 90 88  Resp:  18 18 20   Temp:  98 F (36.7 C) 98.2 F (36.8 C) 98 F (36.7 C)  TempSrc:  Oral Oral   SpO2:  98% 100% 100%  Weight:  82 kg    Height:        Intake/Output Summary (Last 24 hours) at 09/17/2019 1116 Last data filed at 09/16/2019 2200 Gross per 24 hour  Intake 1802.53 ml  Output 1000 ml  Net 802.53 ml   Filed Weights   09/15/19 1627 09/16/19 1010 09/16/19 1319  Weight: 78.2 kg 83 kg 82 kg    Examination: General exam: Alert, awake, oriented x 3; no chest pain, no nausea, no vomiting.  Patient reports left upper extremity pain and swelling.  Breathing improved and no requiring oxygen supplementation currently.  Remains without urine output.  Tolerated hemodialysis treatment on [id number] without complications. Respiratory system: No wheezing, no crackles, fair air movement bilaterally.  No using accessory muscle.  Good O2 sat on room air. Cardiovascular system: RRR. No murmurs, rubs, gallops. Gastrointestinal system: Abdomen is nondistended, soft and nontender. No organomegaly or masses felt. Normal bowel sounds heard. Central nervous system: Alert and oriented. No focal neurological deficits. Extremities: No cyanosis or clubbing; trace edema bilaterally appreciated on her legs.  Left upper extremity swelling on examination. Skin: No rashes, lesions or ulcers; right femoral HD cath in place, no erythema, drainage or signs  of superimposed infection.  Psychiatry: Judgement and insight appear normal. Mood & affect appropriate.    Data Reviewed: I have personally reviewed following labs and imaging studies  CBC: Recent Labs  Lab 09/14/19 0819 09/14/19 1210 09/15/19 0545 09/17/19 0042  WBC 14.4* 13.5* 13.5* 14.8*  NEUTROABS 12.2*  --   --  13.8*  HGB 15.7* 15.3* 13.7 11.7*  HCT  50.9* 49.1* 40.5 34.6*  MCV 98.8 99.0 91.4 91.1  PLT 375 329 267 0000000   Basic Metabolic Panel: Recent Labs  Lab 09/14/19 1153 09/14/19 1210 09/14/19 1727 09/15/19 0545 09/16/19 0729 09/17/19 0646  NA  --  134* 135 134* 138 134*  K  --  6.9* 6.5* 6.0* 4.9 3.9  CL  --  102 101 98 101 99  CO2  --  17* 19* 22 24 22   GLUCOSE  --  145* 162* 126* 94 137*  BUN  --  51* 56* 65* 55* 53*  CREATININE  --  4.70* 4.97* 5.22* 4.82* 4.77*  CALCIUM  --  6.5* 6.1* 5.6* 6.4* 7.2*  MG 2.5* 2.7*  --   --   --   --   PHOS  --  10.5*  --   --   --   --    GFR: Estimated Creatinine Clearance: 14.8 mL/min (A) (by C-G formula based on SCr of 4.77 mg/dL (H)).   Liver Function Tests: Recent Labs  Lab 09/14/19 1210 09/14/19 1727 09/15/19 0545 09/16/19 0729 09/17/19 0646  AST 2,046* 1,780* 1,187* 576* 431*  ALT 959* 898* 709* 475* 393*  ALKPHOS 108 131* 115 98 84  BILITOT 0.4 0.3 0.3 0.7 0.5  PROT 7.5 6.9 6.2* 5.6* 5.7*  ALBUMIN 3.5 3.3* 2.9* 2.4* 2.5*   Recent Labs  Lab 09/14/19 1153  LIPASE 27   Recent Labs  Lab 09/14/19 1153  AMMONIA 27   Coagulation Profile: Recent Labs  Lab 09/14/19 0819  INR 1.1   Cardiac Enzymes: Recent Labs  Lab 09/14/19 1727 09/16/19 1135 09/17/19 0646  CKTOTAL >50,000* 45,186* 24,838*   HbA1C: Recent Labs    09/14/19 1208  HGBA1C 5.3   CBG: Recent Labs  Lab 09/16/19 1124 09/16/19 1610 09/16/19 2128 09/17/19 0748 09/17/19 1109  GLUCAP 93 136* 134* 144* 148*   Lipid Profile: Recent Labs    09/14/19 1153 09/15/19 0545  CHOL  --  159  HDL  --  31*  LDLCALC  --  80  TRIG 177* 241*  CHOLHDL  --  5.1   Thyroid Function Tests: Recent Labs    09/14/19 1209  TSH 7.473*   Anemia Panel: Recent Labs    09/14/19 1209  VITAMINB12 499   Urine analysis:    Component Value Date/Time   COLORURINE AMBER (A) 09/14/2019 0841   APPEARANCEUR TURBID (A) 09/14/2019 0841   LABSPEC 1.017 09/14/2019 0841   PHURINE 6.0 09/14/2019 0841    GLUCOSEU 50 (A) 09/14/2019 0841   HGBUR LARGE (A) 09/14/2019 0841   BILIRUBINUR NEGATIVE 09/14/2019 [place] 09/14/2019 0841   PROTEINUR 100 (A) 09/14/2019 0841   UROBILINOGEN 1.0 10/22/2007 1645   NITRITE NEGATIVE 09/14/2019 0841   LEUKOCYTESUR NEGATIVE 09/14/2019 0841    Recent Results (from the past 240 hour(s))  Urine culture     Status: Abnormal   Collection Time: 09/14/19  8:41 AM   Specimen: Urine, Random  Result Value Ref Range Status   Specimen Description   Final    URINE, RANDOM Performed at [facility], [address], [place], [place] [address]    Special Requests   Final    NONE Performed at [facility], [address], [place], [place] [address]    Culture (A)  Final    30,000 COLONIES/mL GROUP B STREP(S.AGALACTIAE)ISOLATED TESTING AGAINST S. AGALACTIAE NOT ROUTINELY PERFORMED DUE TO PREDICTABILITY OF AMP/PEN/VAN SUSCEPTIBILITY. Performed at [facility], [place] [address], [place], [place] [address]    Report Status 09/16/2019 FINAL  Final  SARS Coronavirus 2 [facility] order, Performed in [facility] hospital lab) Nasopharyngeal Nasopharyngeal Swab     Status: None   Collection Time: 09/14/19 10:04 AM   Specimen: Nasopharyngeal Swab  Result Value Ref Range Status   SARS Coronavirus 2 NEGATIVE NEGATIVE Final    Comment: (NOTE) If result is NEGATIVE SARS-CoV-2 target nucleic acids are NOT DETECTED. The SARS-CoV-2 RNA is generally detectable in upper and lower  respiratory specimens during the acute phase of infection. The lowest  concentration of SARS-CoV-2 viral copies this assay can detect is 250  copies / mL. A negative result does not preclude SARS-CoV-2 infection  and should not be used as the sole basis for treatment or other  patient management decisions.  A negative result may occur with  improper specimen collection / handling, submission of specimen other  than nasopharyngeal swab, presence of viral mutation(s) within the  areas  targeted by this assay, and inadequate number of viral copies  (<250 copies / mL). A negative result must be combined with clinical  observations, patient history, and epidemiological information. If result is POSITIVE SARS-CoV-2 target nucleic acids are DETECTED. The SARS-CoV-2 RNA is generally detectable in upper and lower  respiratory specimens dur ing the acute phase of infection.  Positive  results are indicative of active infection with SARS-CoV-2.  Clinical  correlation with patient history and other diagnostic information is  necessary to determine patient infection status.  Positive results do  not rule out bacterial infection or co-infection with other viruses. If result is PRESUMPTIVE POSTIVE SARS-CoV-2 nucleic acids MAY BE PRESENT.   A presumptive positive result was obtained on the submitted specimen  and confirmed on repeat testing.  While 2019 novel coronavirus  (SARS-CoV-2) nucleic acids may be present in the submitted sample  additional confirmatory testing may be necessary for epidemiological  and / or clinical management purposes  to differentiate between  SARS-CoV-2 and other Sarbecovirus currently known to infect humans.  If clinically indicated additional testing with an alternate test  methodology [phone]) is advised. The SARS-CoV-2 RNA is generally  detectable in upper and lower respiratory sp ecimens during the acute  phase of infection. The expected result is Negative. Fact Sheet for Patients:  StrictlyIdeas.no Fact Sheet for Healthcare Providers: BankingDealers.co.za This test is not yet approved or cleared by the [place] FDA and has been authorized for detection and/or diagnosis of SARS-CoV-2 by FDA under an Emergency Use Authorization (EUA).  This EUA will remain in effect (meaning this test can be used) for the duration of the COVID-19 declaration under Section 564(b)(1) of the Act, 21 U.S.C. section  360bbb-3(b)(1), unless the authorization is terminated or revoked sooner. Performed at [facility], [address], [place], [place] [address]   MRSA PCR Screening     Status: None   Collection Time: 09/15/19  8:45 AM   Specimen: Nasal Mucosa; Nasopharyngeal  Result Value Ref Range Status   MRSA by PCR NEGATIVE NEGATIVE Final    Comment:  The GeneXpert MRSA Assay (FDA approved for NASAL specimens only), is one component of a comprehensive MRSA colonization surveillance program. It is not intended to diagnose MRSA infection nor to guide or monitor treatment for MRSA infections. Performed at [facility], [address], [place], [place] [address]      Radiology Studies: Dg Elbow 2 Views Left  Result Date: 09/15/2019 CLINICAL DATA:  Pain and swelling, stroke, unknown injury EXAM: LEFT SHOULDER - 2+ VIEW; LEFT ELBOW - 2 VIEW COMPARISON:  None. FINDINGS: No fracture or dislocation of the left shoulder. The joint spaces are well preserved. The partially imaged left chest is unremarkable. No fracture or dislocation of the left elbow. Joint spaces are well preserved. No elbow joint effusion. Diffuse soft tissue edema about the elbow. IMPRESSION: 1. No fracture or dislocation of the left shoulder. The joint spaces are well preserved. 2. No fracture or dislocation of the left elbow. Joint spaces are well preserved. No elbow joint effusion. Diffuse soft tissue edema about the elbow. Electronically Signed   By: Eddie Candle M.D.   On: 09/15/2019 21:50   Dg Shoulder Left  Result Date: 09/15/2019 CLINICAL DATA:  Pain and swelling, stroke, unknown injury EXAM: LEFT SHOULDER - 2+ VIEW; LEFT ELBOW - 2 VIEW COMPARISON:  None. FINDINGS: No fracture or dislocation of the left shoulder. The joint spaces are well preserved. The partially imaged left chest is unremarkable. No fracture or dislocation of the left elbow. Joint spaces are well preserved. No elbow joint effusion. Diffuse soft tissue edema  about the elbow. IMPRESSION: 1. No fracture or dislocation of the left shoulder. The joint spaces are well preserved. 2. No fracture or dislocation of the left elbow. Joint spaces are well preserved. No elbow joint effusion. Diffuse soft tissue edema about the elbow. Electronically Signed   By: Eddie Candle M.D.   On: 09/15/2019 21:50    Scheduled Meds: . aspirin EC  81 mg Oral Daily  . atorvastatin  20 mg Oral q1800  . azithromycin  500 mg Oral Daily  . Chlorhexidine Gluconate Cloth  6 each Topical Once   And  . Chlorhexidine Gluconate Cloth  6 each Topical Once  . Chlorhexidine Gluconate Cloth  6 each Topical Q0600  . folic acid  1 mg Oral Daily  . heparin injection (subcutaneous)  5,000 Units Subcutaneous Q8H  . insulin aspart  0-5 Units Subcutaneous QHS  . insulin aspart  0-9 Units Subcutaneous TID WC  . levothyroxine  50 mcg Oral Q0600  . multivitamin with minerals  1 tablet Oral Daily  . nicotine  14 mg Transdermal Daily  . pantoprazole  40 mg Oral Daily  . sodium chloride flush  3 mL Intravenous Q12H  . thiamine  100 mg Oral Daily   Or  . thiamine  100 mg Intravenous Daily   Continuous Infusions: . sodium chloride    . sodium chloride 75 mL/hr at 09/17/19 0715  . cefTRIAXone (ROCEPHIN)  IV Stopped (09/16/19 1422)  . methylPREDNISolone (SOLU-MEDROL) injection 500 mg (09/17/19 0913)     LOS: 3 days    Time spent: 30 minutes.    Barton Dubois, MD [facility] Pager [phone]   09/17/2019, 11:16 AM

## 2019-09-17 NOTE — Progress Notes (Addendum)
Called by RN that patient's left upper arm is swollen. Patient seen and examined, says that started swelling since yesterday. On exam Left upper extremity is swollen, palpable pulses palpable, sensations intact  Will order venous duplex of left upper extremity to rule out DVT  Critical care time spent 35 minutes

## 2019-09-17 NOTE — Progress Notes (Signed)
Patient continue to c/o left arm pain.  Patient c/o heaviness to left arm. Patient also continues to have vaginal bleeding despite cycle being last week. Notified MD of patient issues.  MD came to floor and assessed patient.  MD to place orders to address patient complaints.

## 2019-09-18 ENCOUNTER — Encounter (HOSPITAL_COMMUNITY): Payer: Self-pay | Admitting: General Surgery

## 2019-09-18 DIAGNOSIS — N179 Acute kidney failure, unspecified: Secondary | ICD-10-CM | POA: Diagnosis not present

## 2019-09-18 DIAGNOSIS — R34 Anuria and oliguria: Secondary | ICD-10-CM

## 2019-09-18 LAB — CBC
HCT: 31.8 % — ABNORMAL LOW (ref 36.0–46.0)
Hemoglobin: 10.9 g/dL — ABNORMAL LOW (ref 12.0–15.0)
MCH: 31 pg (ref 26.0–34.0)
MCHC: 34.3 g/dL (ref 30.0–36.0)
MCV: 90.3 fL (ref 80.0–100.0)
Platelets: 177 10*3/uL (ref 150–400)
RBC: 3.52 MIL/uL — ABNORMAL LOW (ref 3.87–5.11)
RDW: 14.8 % (ref 11.5–15.5)
WBC: 13.9 10*3/uL — ABNORMAL HIGH (ref 4.0–10.5)
nRBC: 0.1 % (ref 0.0–0.2)

## 2019-09-18 LAB — RENAL FUNCTION PANEL
Albumin: 2.8 g/dL — ABNORMAL LOW (ref 3.5–5.0)
Anion gap: 13 (ref 5–15)
BUN: 79 mg/dL — ABNORMAL HIGH (ref 6–20)
CO2: 22 mmol/L (ref 22–32)
Calcium: 7.1 mg/dL — ABNORMAL LOW (ref 8.9–10.3)
Chloride: 97 mmol/L — ABNORMAL LOW (ref 98–111)
Creatinine, Ser: 6.29 mg/dL — ABNORMAL HIGH (ref 0.44–1.00)
GFR calc Af Amer: 8 mL/min — ABNORMAL LOW (ref >60)
GFR calc non Af Amer: 7 mL/min — ABNORMAL LOW (ref >60)
Glucose, Bld: 121 mg/dL — ABNORMAL HIGH (ref 70–99)
Phosphorus: 7.6 mg/dL — ABNORMAL HIGH (ref 2.5–4.6)
Potassium: 4.4 mmol/L (ref 3.5–5.1)
Sodium: 132 mmol/L — ABNORMAL LOW (ref 135–145)

## 2019-09-18 LAB — GLUCOSE, CAPILLARY
Glucose-Capillary: 122 mg/dL — ABNORMAL HIGH (ref 70–99)
Glucose-Capillary: 108 mg/dL — ABNORMAL HIGH (ref 70–99)
Glucose-Capillary: 117 mg/dL — ABNORMAL HIGH (ref 70–99)

## 2019-09-18 LAB — CK: Total CK: 22451 U/L — ABNORMAL HIGH (ref 38–234)

## 2019-09-18 LAB — LEGIONELLA PNEUMOPHILA SEROGP 1 UR AG: L. pneumophila Serogp 1 Ur Ag: NEGATIVE

## 2019-09-18 LAB — COMPLEMENT, TOTAL: Compl, Total (CH50): >60 U/mL (ref >41)

## 2019-09-18 MED ORDER — SODIUM CHLORIDE 0.9 % IV SOLN: 100.0000 mL | INTRAVENOUS | Status: DC | PRN

## 2019-09-18 MED ORDER — HEPARIN SODIUM (PORCINE) 1000 UNIT/ML DIALYSIS
1000.0000 [IU] | INTRAMUSCULAR | Status: DC | PRN
  Filled 2019-09-18: qty 1

## 2019-09-18 MED ORDER — PENTAFLUOROPROP-TETRAFLUOROETH EX AERO: 1.0000 "application " | INHALATION_SPRAY | CUTANEOUS | Status: DC | PRN

## 2019-09-18 MED ORDER — CHLORHEXIDINE GLUCONATE CLOTH 2 % EX PADS
6.0000 | MEDICATED_PAD | Freq: Every day | CUTANEOUS | Status: DC
  Administered 2019-09-18 – 2019-09-23 (×5): 6 via TOPICAL

## 2019-09-18 MED ORDER — HEPARIN SODIUM (PORCINE) 1000 UNIT/ML DIALYSIS
20.0000 [IU]/kg | INTRAMUSCULAR | Status: DC | PRN
  Filled 2019-09-18: qty 2

## 2019-09-18 MED ORDER — ALTEPLASE 2 MG IJ SOLR: 2.0000 mg | Freq: Once | INTRAMUSCULAR | Status: DC | PRN

## 2019-09-18 MED ORDER — LIDOCAINE HCL (PF) 1 % IJ SOLN: 5.0000 mL | INTRAMUSCULAR | Status: DC | PRN

## 2019-09-18 MED ORDER — LIDOCAINE-PRILOCAINE 2.5-2.5 % EX CREA: 1.0000 "application " | TOPICAL_CREAM | CUTANEOUS | Status: DC | PRN

## 2019-09-18 NOTE — Progress Notes (Signed)
[facility]  Patient for tunneled dialysis catheter tomorrow around 12:30pm. Patient in agreement. Drug screen ordered.  Curlene Labrum, MD [facility] [address] [place], [place] [address] [phone] (office)

## 2019-09-18 NOTE — Progress Notes (Signed)
[place] A. Merlene Laughter, MD     [email or web address]          NOELIA KALFAS is an 48 y.o. female.   Assessment/Plan: 1.  There appears to be two neurological problems at play from an examination standpoint.  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  Potential etiologies includes medication effect, polymyositis, dermatomyositis, sarcoidosis and toxins.  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    Nerve conduction study and EMG will need to be done.  This will be set up an outpatient basis. 2. 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  Patient will need extensive nerve conduction testing and a needle electromyography to sort this out which will need to be done as an outpatient.  However, given the severely elevated CPK, I recommend high-dose steroids.  Prednisone 100 mg daily is the recommended dose at this time.  She would likely need steroids for several weeks to months. CPK should be followed daily to assess improvements. 3.  Multiple metabolic disturbance likely due to a large degree to the severely elevated CPK.  She reports that she has improved.  She reports less weakness and pain.    GENERAL: The patient appears to be in significant discomfort but in no acute distress.  HEENT: Neck is supple no trauma appreciated.  ABDOMEN: soft  EXTREMITIES: No severe swelling noted at this time.  She does seem to have significant pain in all 4 extremities with manipulation.  BACK: Normal  SKIN: Normal by inspec her medical history.     MENTAL STATUS:  Today she is awake, alert and lucid.  Speech is normal.   CRANIAL NERVES: Pupils are equal, round and reactive to light and accomodation; extra ocular movements are full, there is no significant  nystagmus; visual fields are full; upper and lower facial muscles are normal in strength and symmetric, there is no flattening of the nasolabial folds; tongue is midline.  MOTOR: Bulk and tone are normal throughout.  Left deltoid 3/5, triceps 4/5 and handgrip 5.  Right deltoid 3/5, triceps 5 and handgrip 5.  Right hip flexion 4+ and left 4-.  Right dorsiflexion 2/5 and left 3/5.  COORDINATION: Left finger to nose is normal, right finger to nose is normal, No rest tremor; no intention tremor; no postural tremor; no bradykinesia.  REFLEXES: Deep tendon reflexes are symmetrical and normal in the upper extremities.  1+ at the right knee and 0 at the left.  1+ at the knees ankles.  Plantar reflexes are flexor bilaterally.   SENSATION: Normal to light touch and pain.      Objective: Vital signs in last 24 hours: Temp:  [97.8 F (36.6 C)-98.7 F (37.1 C)] 98 F (36.7 C) (09/21 1645) Pulse Rate:  [72-90] 79 (09/21 1900) Resp:  [17-18] 18 (09/21 1645) BP: (154-184)/(79-110) 177/110 (09/21 1900) SpO2:  [94 %-100 %] 98 % (09/21 1645) Weight:  [84.1 kg] 84.1 kg (09/21 1645)  Intake/Output from previous day: 09/20 0701 - 09/21 0700 In: 720 [P.O.:720] Out: -  Intake/Output this shift: No intake/output data recorded. Nutritional status:  Diet Order            Diet renal/carb modified with fluid restriction Diet-HS Snack? Nothing; Fluid restriction: 2000 mL Fluid; Room service appropriate? Yes; Fluid consistency: Thin  Diet effective now  Lab Results: Results for orders placed or performed during the hospital encounter of 09/14/19 (from the past 48 hour(s))  Glucose, capillary     Status: Abnormal   Collection Time: 09/16/19  9:28 PM  Result Value Ref Range   Glucose-Capillary 134 (H) 70 - 99 mg/dL   Comment 1 Notify RN    Comment 2 Document in Chart   CBC with Differential/Platelet     Status: Abnormal   Collection Time: 09/17/19 12:42 AM  Result Value Ref  Range   WBC 14.8 (H) 4.0 - 10.5 K/uL   RBC 3.80 (L) 3.87 - 5.11 MIL/uL   Hemoglobin 11.7 (L) 12.0 - 15.0 g/dL   HCT 34.6 (L) 36.0 - 46.0 %   MCV 91.1 80.0 - 100.0 fL   MCH 30.8 26.0 - 34.0 pg   MCHC 33.8 30.0 - 36.0 g/dL   RDW 15.1 11.5 - 15.5 %   Platelets 176 150 - 400 K/uL   nRBC 0.1 0.0 - 0.2 %   Neutrophils Relative % 93 %   Neutro Abs 13.8 (H) 1.7 - 7.7 K/uL   Lymphocytes Relative 4 %   Lymphs Abs 0.6 (L) 0.7 - 4.0 K/uL   Monocytes Relative 2 %   Monocytes Absolute 0.4 0.1 - 1.0 K/uL   Eosinophils Relative 0 %   Eosinophils Absolute 0.0 0.0 - 0.5 K/uL   Basophils Relative 0 %   Basophils Absolute 0.0 0.0 - 0.1 K/uL   Immature Granulocytes 1 %   Abs Immature Granulocytes 0.07 0.00 - 0.07 K/uL    Comment: Performed at [facility], [address], [place], [place] [address]  Comprehensive metabolic panel     Status: Abnormal   Collection Time: 09/17/19  6:46 AM  Result Value Ref Range   Sodium 134 (L) 135 - 145 mmol/L   Potassium 3.9 3.5 - 5.1 mmol/L    Comment: DELTA CHECK NOTED   Chloride 99 98 - 111 mmol/L   CO2 22 22 - 32 mmol/L   Glucose, Bld 137 (H) 70 - 99 mg/dL   BUN 53 (H) 6 - 20 mg/dL   Creatinine, Ser 4.77 (H) 0.44 - 1.00 mg/dL   Calcium 7.2 (L) 8.9 - 10.3 mg/dL   Total Protein 5.7 (L) 6.5 - 8.1 g/dL   Albumin 2.5 (L) 3.5 - 5.0 g/dL   AST 431 (H) 15 - 41 U/L   ALT 393 (H) 0 - 44 U/L   Alkaline Phosphatase 84 38 - 126 U/L   Total Bilirubin 0.5 0.3 - 1.2 mg/dL   GFR calc non Af Amer 10 (L) >60 mL/min   GFR calc Af Amer 12 (L) >60 mL/min   Anion gap 13 5 - 15    Comment: Performed at [facility], [address], [place], [place] [address]  CK     Status: Abnormal   Collection Time: 09/17/19  6:46 AM  Result Value Ref Range   Total CK 24,838 (H) 38 - 234 U/L    Comment: RESULTS CONFIRMED BY MANUAL DILUTION Performed at [facility], [address] [place], [place] [address]   Glucose, capillary     Status: Abnormal   Collection Time: 09/17/19  7:48 AM    Result Value Ref Range   Glucose-Capillary 144 (H) 70 - 99 mg/dL  Glucose, capillary     Status: Abnormal   Collection Time: 09/17/19 11:09 AM  Result Value Ref Range   Glucose-Capillary 148 (H) 70 - 99 mg/dL  Glucose, capillary  Status: Abnormal   Collection Time: 09/17/19  4:06 PM  Result Value Ref Range   Glucose-Capillary 134 (H) 70 - 99 mg/dL  Glucose, capillary     Status: Abnormal   Collection Time: 09/17/19  9:30 PM  Result Value Ref Range   Glucose-Capillary 147 (H) 70 - 99 mg/dL  CK     Status: Abnormal   Collection Time: 09/18/19  6:21 AM  Result Value Ref Range   Total CK 22,451 (H) 38 - 234 U/L    Comment: RESULTS CONFIRMED BY MANUAL DILUTION Performed at [facility], [address], [place], [place] [address]   Renal function panel     Status: Abnormal   Collection Time: 09/18/19  6:21 AM  Result Value Ref Range   Sodium 132 (L) 135 - 145 mmol/L   Potassium 4.4 3.5 - 5.1 mmol/L   Chloride 97 (L) 98 - 111 mmol/L   CO2 22 22 - 32 mmol/L   Glucose, Bld 121 (H) 70 - 99 mg/dL   BUN 79 (H) 6 - 20 mg/dL   Creatinine, Ser 6.29 (H) 0.44 - 1.00 mg/dL   Calcium 7.1 (L) 8.9 - 10.3 mg/dL   Phosphorus 7.6 (H) 2.5 - 4.6 mg/dL   Albumin 2.8 (L) 3.5 - 5.0 g/dL   GFR calc non Af Amer 7 (L) >60 mL/min   GFR calc Af Amer 8 (L) >60 mL/min   Anion gap 13 5 - 15    Comment: Performed at [facility], [address] [place], [place] [address]  CBC     Status: Abnormal   Collection Time: 09/18/19  6:21 AM  Result Value Ref Range   WBC 13.9 (H) 4.0 - 10.5 K/uL   RBC 3.52 (L) 3.87 - 5.11 MIL/uL   Hemoglobin 10.9 (L) 12.0 - 15.0 g/dL   HCT 31.8 (L) 36.0 - 46.0 %   MCV 90.3 80.0 - 100.0 fL   MCH 31.0 26.0 - 34.0 pg   MCHC 34.3 30.0 - 36.0 g/dL   RDW 14.8 11.5 - 15.5 %   Platelets 177 150 - 400 K/uL   nRBC 0.1 0.0 - 0.2 %    Comment: Performed at [facility], [address], [place], [place] [address]  Glucose, capillary     Status: Abnormal   Collection Time: 09/18/19  7:25  AM  Result Value Ref Range   Glucose-Capillary 122 (H) 70 - 99 mg/dL  Glucose, capillary     Status: Abnormal   Collection Time: 09/18/19 11:55 AM  Result Value Ref Range   Glucose-Capillary 108 (H) 70 - 99 mg/dL  Glucose, capillary     Status: Abnormal   Collection Time: 09/18/19  4:54 PM  Result Value Ref Range   Glucose-Capillary 117 (H) 70 - 99 mg/dL    Lipid Panel No results for input(s): CHOL, TRIG, HDL, CHOLHDL, VLDL, LDLCALC in the last 72 hours.  Studies/Results:   Medications:  Scheduled Meds:  aspirin EC  81 mg Oral Daily   atorvastatin  20 mg Oral q1800   Chlorhexidine Gluconate Cloth  6 each Topical Once   And   Chlorhexidine Gluconate Cloth  6 each Topical Once   Chlorhexidine Gluconate Cloth  6 each Topical Q0600   Chlorhexidine Gluconate Cloth  6 each Topical 99991111   folic acid  1 mg Oral Daily   heparin injection (subcutaneous)  5,000 Units Subcutaneous Q8H   insulin aspart  0-5 Units Subcutaneous QHS   insulin aspart  0-9 Units Subcutaneous  TID WC   levothyroxine  50 mcg Oral Q0600   multivitamin with minerals  1 tablet Oral Daily   nicotine  14 mg Transdermal Daily   pantoprazole  40 mg Oral Daily   sodium chloride flush  3 mL Intravenous Q12H   thiamine  100 mg Oral Daily   Or   thiamine  100 mg Intravenous Daily   Continuous Infusions:  sodium chloride     sodium chloride     sodium chloride     cefTRIAXone (ROCEPHIN)  IV 2 g (09/18/19 1322)   methylPREDNISolone (SOLU-MEDROL) injection 500 mg (09/18/19 1027)   PRN Meds:.sodium chloride, sodium chloride, sodium chloride, alteplase, heparin, heparin, lidocaine (PF), lidocaine-prilocaine, ondansetron (ZOFRAN) IV, pentafluoroprop-tetrafluoroeth, sodium chloride flush, traMADol     LOS: 4 days   Delante Karapetyan A. Merlene Laughter, M.D.  Diplomate, [employer] adviser of Psychiatry and Neurology ( Neurology).

## 2019-09-18 NOTE — Progress Notes (Signed)
PROGRESS NOTE    Alicia Flowers  [id number] DOB: [birth year] DOA: 09/14/2019 PCP: Patient, No Pcp Per     Brief Narrative:  48 y.o. female with a medical history significant for tobacco abuse, alcohol abuse, cocaine abuse and marijuana use; who presented to the hospital secondary to worsening numbness in her left upper extremity and left leg.  Patient reports his symptoms have been present for approximately 3 months but worsening in the last 2 to 3 days prior to admission.  Patient also expressed feeling slightly more tired and having decrease oral intake.  No chest pain, no nausea, no vomiting, no productive cough, no sick contacts.  Patient reports to continue smoking about half pack per day and is drinking on daily basis.  Last use of marijuana was the night prior to admission.   In the ED patient was found to be mildly hypoxic, with elevated WBCs, acute renal failure with a creatinine close to 5, hyperkalemic, hypophosphatemic with a normal radiologic studies suggesting community-acquired pneumonia.  She also has a positive MRI demonstrating right occipital stroke.  Cultures were taken, sodium bicarbonate, insulin and calcium gluconate provided for hyperkalemia.  Patient is started on IV antibiotics and [facility] has been contacted to admit patient for further evaluation and management.   Assessment & Plan: 1-hypoxia/community-acquired pneumonia -Afebrile -WBCs were slowly trending down; anticipated confusion information after initiation of steroids as recommended by neurology service. -Reports no chest pain -Continue flutter valve, good oxygen saturation on room air currently.  -Follow culture results -Continue current antibiotics.  2-acute renal failure/hyperkalemiahypocalcemia -Continue avoiding nephrotoxic agents -patient remains anuric. -Creatinine down after HD; still without urine output.  -Femoral catheter in place, tolerated first HD tx on 9/18; second treatment provided on  (9/19); most likely HD again later today, but will follow renal service rec's.   -Continue supportive care. -Follow renal function trend. -Patient renal failure appears to be secondary to rhabdomyolysis. -Renal ultrasound without obstructive uropathy.  3-history of alcohol abuse-no active withdrawal appreciated -Continue CIWA protocol -Continue thiamine and folic acid.  4-tobacco abuse, cocaine abuse/marijuana use -Cessation counseling has been provided -Continue nicotine patch  5-right occipital stroke/concerns for myositis  -Patient presenting with left numbness affecting left side -Abnormal electrolytes and renal failure slightly contributed to left side weakness/numbness. -Follow results from dialysis trial -Continue electrolytes repletion as needed -Followin neurology service recommendations patient has been started on IV steroids.  Outpatient and electromyography recommended. -Will continue aspirin for secondary prevention and also statins -LDL of 80 and HDL 31; triglycerides level 241. -A1c 5.3. -Normal B12 level and elevated TSH. -CK level trending down  6-elevated TSH/hypothyroidism -Started on Synthroid and will recommend repeat thyroid profile in 4-6 weeks.  7-mixed hyperlipidemia -Patient started on Lipitor  8-elevated troponin -No chest pain -No acute ischemic changes appreciated on telemetry or EKG -Reassuring 2D echo with normal ejection fraction and no wall motion abnormalities. -Patient's elevated troponin most likely associated with ongoing rhabdomyolysis and renal failure. -Continue statins and aspirin. -no further acute inpatient cardiac work up.  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 -Alcohol cessation counseling has been provided.   DVT prophylaxis: heparin  Code  Status: Full code. Family Communication: No family at bedside. Disposition Plan: Continue monitoring on telemetry bed, will follow nephrology service recommendation for next hemodialysis treatment. Continue current antibiotics for community-acquired pneumonia.  Continue IV steroids.  Consultants:   Nephrology service  Neurology  Cardiology service has been curbside (Dr. Bronson Ing)  Procedures:   See below for x-ray reports.  Antimicrobials:  Anti-infectives (From admission, onward)   Start     Dose/Rate Route Frequency Ordered Stop   09/15/19 1230  cefTRIAXone (ROCEPHIN) 2 g in sodium chloride 0.9 % 100 mL IVPB     2 g 200 mL/hr over 30 Minutes Intravenous Every 24 hours 09/14/19 1228 09/20/19 1229   09/15/19 1200  azithromycin (ZITHROMAX) tablet 500 mg     500 mg Oral Daily 09/15/19 1024 09/18/19 0827   09/14/19 1230  cefTRIAXone (ROCEPHIN) 2 g in sodium chloride 0.9 % 100 mL IVPB  Status:  Discontinued     2 g 200 mL/hr over 30 Minutes Intravenous Every 24 hours 09/14/19 1216 09/14/19 1228   09/14/19 1230  azithromycin (ZITHROMAX) 500 mg in sodium chloride 0.9 % 250 mL IVPB  Status:  Discontinued     500 mg 250 mL/hr over 60 Minutes Intravenous Every 24 hours 09/14/19 1216 09/15/19 1024   09/14/19 1230  cefTRIAXone (ROCEPHIN) 1 g in sodium chloride 0.9 % 100 mL IVPB     1 g 200 mL/hr over 30 Minutes Intravenous  Once 09/14/19 1229 09/14/19 1421   09/14/19 1200  doxycycline (VIBRAMYCIN) 100 mg in sodium chloride 0.9 % 250 mL IVPB     100 mg 125 mL/hr over 120 Minutes Intravenous  Once 09/14/19 1157 09/15/19 1011   09/14/19 1100  cefTRIAXone (ROCEPHIN) 1 g in sodium chloride 0.9 % 100 mL IVPB     1 g 200 mL/hr over 30 Minutes Intravenous  Once 09/14/19 1046 09/14/19 1206      Subjective: Complaining of left upper extremity pain and swelling.  No headaches, no blurred vision, no chest pain, no shortness of breath, no nausea, no vomiting.  Has remained  oliguric/anuric.  Objective: Vitals:   09/17/19 1420 09/17/19 2013 09/17/19 2125 09/18/19 0700  BP: (!) 157/101  (!) 170/97 (!) 170/79  Pulse: 90  81 90  Resp: 16  18 18   Temp: 97.7 F (36.5 C)  98.7 F (37.1 C) 97.8 F (36.6 C)  TempSrc: Oral  Oral   SpO2: 100% 100% 94% 100%  Weight:      Height:        Intake/Output Summary (Last 24 hours) at 09/18/2019 1236 Last data filed at 09/17/2019 1826 Gross per 24 hour  Intake 480 ml  Output --  Net 480 ml   Filed Weights   09/15/19 1627 09/16/19 1010 09/16/19 1319  Weight: 78.2 kg 83 kg 82 kg    Examination: General exam: Alert, awake, oriented x 3; no fever, no chest pain, no nausea, no vomiting.  Patient has remained anuric/oliguric.  Complaining of left upper extremity pain and swelling. Respiratory system: Good oxygen saturation on room air, no wheezing, no crackles. Respiratory effort normal. Cardiovascular system:RRR. No murmurs, rubs, gallops. Gastrointestinal system: Abdomen is nondistended, soft and nontender. No organomegaly or masses felt. Normal bowel sounds heard. Central nervous system: Alert and oriented. No focal neurological deficits. Extremities: No cyanosis or clubbing appreciated on exam.  Trace edema bilaterally appreciated on her legs.  Left upper extremity swollen and tender on palpation.  She has decreased range of motion and inability to raise it above head. Skin: No rashes, no petechiae, no open wounds.  Right femoral hemodialysis catheter in place, there is no erythematous changes, drainage or signs of superimposed infection in the area. Psychiatry: Judgement and  insight appear normal. Mood & affect appropriate.   Data Reviewed: I have personally reviewed following labs and imaging studies  CBC: Recent Labs  Lab 09/14/19 0819 09/14/19 1210 09/15/19 0545 09/17/19 0042 09/18/19 0621  WBC 14.4* 13.5* 13.5* 14.8* 13.9*  NEUTROABS 12.2*  --   --  13.8*  --   HGB 15.7* 15.3* 13.7 11.7* 10.9*  HCT  50.9* 49.1* 40.5 34.6* 31.8*  MCV 98.8 99.0 91.4 91.1 90.3  PLT 375 329 267 176 [id number]   Basic Metabolic Panel: Recent Labs  Lab 09/14/19 1153 09/14/19 1210 09/14/19 1727 09/15/19 0545 09/16/19 0729 09/17/19 0646 09/18/19 0621  NA  --  134* 135 134* 138 134* 132*  K  --  6.9* 6.5* 6.0* 4.9 3.9 4.4  CL  --  102 101 98 101 99 97*  CO2  --  17* 19* 22 24 22 22   GLUCOSE  --  145* 162* 126* 94 137* 121*  BUN  --  51* 56* 65* 55* 53* 79*  CREATININE  --  4.70* 4.97* 5.22* 4.82* 4.77* 6.29*  CALCIUM  --  6.5* 6.1* 5.6* 6.4* 7.2* 7.1*  MG 2.5* 2.7*  --   --   --   --   --   PHOS  --  10.5*  --   --   --   --  7.6*   GFR: Estimated Creatinine Clearance: 11.2 mL/min (A) (by C-G formula based on SCr of 6.29 mg/dL (H)).   Liver Function Tests: Recent Labs  Lab 09/14/19 1210 09/14/19 1727 09/15/19 0545 09/16/19 0729 09/17/19 0646 09/18/19 0621  AST 2,046* 1,780* 1,187* 576* 431*  --   ALT 959* 898* 709* 475* 393*  --   ALKPHOS 108 131* 115 98 84  --   BILITOT 0.4 0.3 0.3 0.7 0.5  --   PROT 7.5 6.9 6.2* 5.6* 5.7*  --   ALBUMIN 3.5 3.3* 2.9* 2.4* 2.5* 2.8*   Recent Labs  Lab 09/14/19 1153  LIPASE 27   Recent Labs  Lab 09/14/19 1153  AMMONIA 27   Coagulation Profile: Recent Labs  Lab 09/14/19 0819  INR 1.1   Cardiac Enzymes: Recent Labs  Lab 09/14/19 1727 09/16/19 1135 09/17/19 0646 09/18/19 0621  CKTOTAL >50,000* 45,186* 24,838* 22,451*   CBG: Recent Labs  Lab 09/17/19 1109 09/17/19 1606 09/17/19 2130 09/18/19 0725 09/18/19 1155  GLUCAP 148* 134* 147* 122* 108*   Urine analysis:    Component Value Date/Time   COLORURINE AMBER (A) 09/14/2019 0841   APPEARANCEUR TURBID (A) 09/14/2019 0841   LABSPEC 1.017 09/14/2019 0841   PHURINE 6.0 09/14/2019 0841   GLUCOSEU 50 (A) 09/14/2019 0841   HGBUR LARGE (A) 09/14/2019 0841   BILIRUBINUR NEGATIVE 09/14/2019 [place] 09/14/2019 0841   PROTEINUR 100 (A) 09/14/2019 0841   UROBILINOGEN 1.0  10/22/2007 1645   NITRITE NEGATIVE 09/14/2019 0841   LEUKOCYTESUR NEGATIVE 09/14/2019 0841    Recent Results (from the past 240 hour(s))  Urine culture     Status: Abnormal   Collection Time: 09/14/19  8:41 AM   Specimen: Urine, Random  Result Value Ref Range Status   Specimen Description   Final    URINE, RANDOM Performed at [facility], [address], [place], [place] [address]    Special Requests   Final    NONE Performed at [facility], [address] [place], [place] [address]    Culture (A)  Final    30,000 COLONIES/mL GROUP B STREP(S.AGALACTIAE)ISOLATED  TESTING AGAINST S. AGALACTIAE NOT ROUTINELY PERFORMED DUE TO PREDICTABILITY OF AMP/PEN/VAN SUSCEPTIBILITY. Performed at [facility], [place] [address], [place], [place] [address]    Report Status 09/16/2019 FINAL  Final  SARS Coronavirus 2 [facility] order, Performed in [facility] hospital lab) Nasopharyngeal Nasopharyngeal Swab     Status: None   Collection Time: 09/14/19 10:04 AM   Specimen: Nasopharyngeal Swab  Result Value Ref Range Status   SARS Coronavirus 2 NEGATIVE NEGATIVE Final    Comment: (NOTE) If result is NEGATIVE SARS-CoV-2 target nucleic acids are NOT DETECTED. The SARS-CoV-2 RNA is generally detectable in upper and lower  respiratory specimens during the acute phase of infection. The lowest  concentration of SARS-CoV-2 viral copies this assay can detect is 250  copies / mL. A negative result does not preclude SARS-CoV-2 infection  and should not be used as the sole basis for treatment or other  patient management decisions.  A negative result may occur with  improper specimen collection / handling, submission of specimen other  than nasopharyngeal swab, presence of viral mutation(s) within the  areas targeted by this assay, and inadequate number of viral copies  (<250 copies / mL). A negative result must be combined with clinical  observations, patient history, and epidemiological  information. If result is POSITIVE SARS-CoV-2 target nucleic acids are DETECTED. The SARS-CoV-2 RNA is generally detectable in upper and lower  respiratory specimens dur ing the acute phase of infection.  Positive  results are indicative of active infection with SARS-CoV-2.  Clinical  correlation with patient history and other diagnostic information is  necessary to determine patient infection status.  Positive results do  not rule out bacterial infection or co-infection with other viruses. If result is PRESUMPTIVE POSTIVE SARS-CoV-2 nucleic acids MAY BE PRESENT.   A presumptive positive result was obtained on the submitted specimen  and confirmed on repeat testing.  While 2019 novel coronavirus  (SARS-CoV-2) nucleic acids may be present in the submitted sample  additional confirmatory testing may be necessary for epidemiological  and / or clinical management purposes  to differentiate between  SARS-CoV-2 and other Sarbecovirus currently known to infect humans.  If clinically indicated additional testing with an alternate test  methodology [id number]) is advised. The SARS-CoV-2 RNA is generally  detectable in upper and lower respiratory sp ecimens during the acute  phase of infection. The expected result is Negative. Fact Sheet for Patients:  StrictlyIdeas.no Fact Sheet for Healthcare Providers: BankingDealers.co.za This test is not yet approved or cleared by the [place] FDA and has been authorized for detection and/or diagnosis of SARS-CoV-2 by FDA under an Emergency Use Authorization (EUA).  This EUA will remain in effect (meaning this test can be used) for the duration of the COVID-19 declaration under Section 564(b)(1) of the Act, 21 U.S.C. section 360bbb-3(b)(1), unless the authorization is terminated or revoked sooner. Performed at [facility], [address], [place], [place] [address]   MRSA PCR Screening     Status: None    Collection Time: 09/15/19  8:45 AM   Specimen: Nasal Mucosa; Nasopharyngeal  Result Value Ref Range Status   MRSA by PCR NEGATIVE NEGATIVE Final    Comment:        The GeneXpert MRSA Assay (FDA approved for NASAL specimens only), is one component of a comprehensive MRSA colonization surveillance program. It is not intended to diagnose MRSA infection nor to guide or monitor treatment for MRSA infections. Performed at [facility], [address]  [place], [place] [address]      Radiology Studies: US Venous Img Upper Uni Left  Result Date: 09/17/2019 CLINICAL DATA:  Edema, pain EXAM: LEFT UPPER EXTREMITY VENOUS DOPPLER ULTRASOUND TECHNIQUE: Gray-scale sonography with graded compression, as well as color Doppler and duplex ultrasound were performed to evaluate the upper extremity deep venous system from the level of the subclavian vein and including the jugular, axillary, basilic, radial, ulnar and upper cephalic vein. Spectral Doppler was utilized to evaluate flow at rest and with distal augmentation maneuvers. COMPARISON:  None. FINDINGS: Contralateral Subclavian Vein: Respiratory phasicity is normal and symmetric with the symptomatic side. No evidence of thrombus. Normal compressibility. Internal Jugular Vein: No evidence of thrombus. Normal compressibility, respiratory phasicity and response to augmentation. Subclavian Vein: No evidence of thrombus. Normal compressibility, respiratory phasicity and response to augmentation. Axillary Vein: No evidence of thrombus. Normal compressibility, respiratory phasicity and response to augmentation. Cephalic Vein: No evidence of thrombus. Normal compressibility, respiratory phasicity and response to augmentation. Basilic Vein: No evidence of thrombus. Normal compressibility, respiratory phasicity and response to augmentation. Brachial Veins: No evidence of thrombus. Normal compressibility, respiratory phasicity and response to augmentation. Radial Veins:  No evidence of thrombus. Normal compressibility, respiratory phasicity and response to augmentation. Ulnar Veins: No evidence of thrombus. Normal compressibility, respiratory phasicity and response to augmentation. Venous Reflux:  None visualized. Other Findings:  Subcutaneous edema noted in the forearm. IMPRESSION: No evidence of DVT within the left upper extremity. Electronically Signed   By: Lucrezia Europe M.D.   On: 09/17/2019 11:27    Scheduled Meds:  aspirin EC  81 mg Oral Daily   atorvastatin  20 mg Oral q1800   Chlorhexidine Gluconate Cloth  6 each Topical Once   And   Chlorhexidine Gluconate Cloth  6 each Topical Once   Chlorhexidine Gluconate Cloth  6 each Topical Q0600   Chlorhexidine Gluconate Cloth  6 each Topical [id number]   folic acid  1 mg Oral Daily   heparin injection (subcutaneous)  5,000 Units Subcutaneous Q8H   insulin aspart  0-5 Units Subcutaneous QHS   insulin aspart  0-9 Units Subcutaneous TID WC   levothyroxine  50 mcg Oral Q0600   multivitamin with minerals  1 tablet Oral Daily   nicotine  14 mg Transdermal Daily   pantoprazole  40 mg Oral Daily   sodium chloride flush  3 mL Intravenous Q12H   thiamine  100 mg Oral Daily   Or   thiamine  100 mg Intravenous Daily   Continuous Infusions:  sodium chloride     sodium chloride     sodium chloride     cefTRIAXone (ROCEPHIN)  IV 2 g (09/17/19 1137)   methylPREDNISolone (SOLU-MEDROL) injection 500 mg (09/18/19 1027)     LOS: 4 days    Time spent: 30 minutes.   Barton Dubois, MD [facility] Pager [phone]   09/18/2019, 12:36 PM

## 2019-09-18 NOTE — Progress Notes (Signed)
Patient ID: Alicia Flowers, female   DOB: 03-31-1971, 48 y.o.   MRN: [id number]   S: Arm still painful. No sig UOP reported.  R Fem Temp HD cath. CK 22k. No AM Labs  O:BP (!) 170/79   Pulse 90   Temp 97.8 F (36.6 C)   Resp 18   Ht 5\' 3"  (1.6 m)   Wt 82 kg   LMP 09/07/2019   SpO2 100%   BMI 32.02 kg/m   Intake/Output Summary (Last 24 hours) at 09/18/2019 0932 Last data filed at 09/17/2019 1826 Gross per 24 hour  Intake 480 ml  Output -  Net 480 ml   Intake/Output: I/O last 3 completed shifts: In: [id number] [P.O.:720; I.V.:398] Out: -   Intake/Output this shift:  No intake/output data recorded. Weight change:  Gen: NAD, lying in bed HEENT: EOMI PERRL CVS: RRR no m/r/g Resp: cta with decreased BS at bases Abd: +BS, soft, NT Ext: +trace edema, un-tunneled HD catheter in right femoral area  Recent Labs  Lab 09/14/19 0819 09/14/19 1210 09/14/19 1727 09/15/19 0545 09/16/19 0729 09/17/19 0646  NA 136 134* 135 134* 138 134*  K 6.6* 6.9* 6.5* 6.0* 4.9 3.9  CL 101 102 101 98 101 99  CO2 18* 17* 19* 22 24 22   GLUCOSE 163* 145* 162* 126* 94 137*  BUN 48* 51* 56* 65* 55* 53*  CREATININE 4.77* 4.70* 4.97* 5.22* 4.82* 4.77*  ALBUMIN 3.7 3.5 3.3* 2.9* 2.4* 2.5*  CALCIUM 6.5* 6.5* 6.1* 5.6* 6.4* 7.2*  PHOS  --  10.5*  --   --   --   --   AST 2,087* 2,046* 1,780* 1,187* 576* 431*  ALT 932* 959* 898* 709* 475* 393*   Liver Function Tests: Recent Labs  Lab 09/15/19 0545 09/16/19 0729 09/17/19 0646  AST 1,187* 576* 431*  ALT 709* 475* 393*  ALKPHOS 115 98 84  BILITOT 0.3 0.7 0.5  PROT 6.2* 5.6* 5.7*  ALBUMIN 2.9* 2.4* 2.5*   Recent Labs  Lab 09/14/19 1153  LIPASE 27   Recent Labs  Lab 09/14/19 1153  AMMONIA 27   CBC: Recent Labs  Lab 09/14/19 0819 09/14/19 1210 09/15/19 0545 09/17/19 0042  WBC 14.4* 13.5* 13.5* 14.8*  NEUTROABS 12.2*  --   --  13.8*  HGB 15.7* 15.3* 13.7 11.7*  HCT 50.9* 49.1* 40.5 34.6*  MCV 98.8 99.0 91.4 91.1  PLT 375 329 267 176    Cardiac Enzymes: Recent Labs  Lab 09/14/19 1727 09/16/19 1135 09/17/19 0646 09/18/19 0621  CKTOTAL >50,000* 45,186* 24,838* 22,451*   CBG: Recent Labs  Lab 09/17/19 0748 09/17/19 1109 09/17/19 1606 09/17/19 2130 09/18/19 0725  GLUCAP 144* 148* 134* 147* 122*    Iron Studies: No results for input(s): IRON, TIBC, TRANSFERRIN, FERRITIN in the last 72 hours. Studies/Results: US Venous Img Upper Uni Left  Result Date: 09/17/2019 CLINICAL DATA:  Edema, pain EXAM: LEFT UPPER EXTREMITY VENOUS DOPPLER ULTRASOUND TECHNIQUE: Gray-scale sonography with graded compression, as well as color Doppler and duplex ultrasound were performed to evaluate the upper extremity deep venous system from the level of the subclavian vein and including the jugular, axillary, basilic, radial, ulnar and upper cephalic vein. Spectral Doppler was utilized to evaluate flow at rest and with distal augmentation maneuvers. COMPARISON:  None. FINDINGS: Contralateral Subclavian Vein: Respiratory phasicity is normal and symmetric with the symptomatic side. No evidence of thrombus. Normal compressibility. Internal Jugular Vein: No evidence of thrombus. Normal compressibility, respiratory phasicity and response to augmentation.  Subclavian Vein: No evidence of thrombus. Normal compressibility, respiratory phasicity and response to augmentation. Axillary Vein: No evidence of thrombus. Normal compressibility, respiratory phasicity and response to augmentation. Cephalic Vein: No evidence of thrombus. Normal compressibility, respiratory phasicity and response to augmentation. Basilic Vein: No evidence of thrombus. Normal compressibility, respiratory phasicity and response to augmentation. Brachial Veins: No evidence of thrombus. Normal compressibility, respiratory phasicity and response to augmentation. Radial Veins: No evidence of thrombus. Normal compressibility, respiratory phasicity and response to augmentation. Ulnar Veins: No  evidence of thrombus. Normal compressibility, respiratory phasicity and response to augmentation. Venous Reflux:  None visualized. Other Findings:  Subcutaneous edema noted in the forearm. IMPRESSION: No evidence of DVT within the left upper extremity. Electronically Signed   By: Lucrezia Europe M.D.   On: 09/17/2019 11:27   . aspirin EC  81 mg Oral Daily  . atorvastatin  20 mg Oral q1800  . Chlorhexidine Gluconate Cloth  6 each Topical Once   And  . Chlorhexidine Gluconate Cloth  6 each Topical Once  . Chlorhexidine Gluconate Cloth  6 each Topical Q0600  . folic acid  1 mg Oral Daily  . heparin injection (subcutaneous)  5,000 Units Subcutaneous Q8H  . insulin aspart  0-5 Units Subcutaneous QHS  . insulin aspart  0-9 Units Subcutaneous TID WC  . levothyroxine  50 mcg Oral Q0600  . multivitamin with minerals  1 tablet Oral Daily  . nicotine  14 mg Transdermal Daily  . pantoprazole  40 mg Oral Daily  . sodium chloride flush  3 mL Intravenous Q12H  . thiamine  100 mg Oral Daily   Or  . thiamine  100 mg Intravenous Daily    BMET    Component Value Date/Time   NA 134 (L) 09/17/2019 0646   K 3.9 09/17/2019 0646   CL 99 09/17/2019 0646   CO2 22 09/17/2019 0646   GLUCOSE 137 (H) 09/17/2019 0646   BUN 53 (H) 09/17/2019 0646   CREATININE 4.77 (H) 09/17/2019 0646   CALCIUM 7.2 (L) 09/17/2019 0646   GFRNONAA 10 (L) 09/17/2019 0646   GFRAA 12 (L) 09/17/2019 0646   CBC    Component Value Date/Time   WBC 14.8 (H) 09/17/2019 0042   RBC 3.80 (L) 09/17/2019 0042   HGB 11.7 (L) 09/17/2019 0042   HCT 34.6 (L) 09/17/2019 0042   PLT 176 09/17/2019 0042   MCV 91.1 09/17/2019 0042   MCH 30.8 09/17/2019 0042   MCHC 33.8 09/17/2019 0042   RDW 15.1 09/17/2019 0042   LYMPHSABS 0.6 (L) 09/17/2019 0042   MONOABS 0.4 09/17/2019 0042   EOSABS 0.0 09/17/2019 0042   BASOSABS 0.0 09/17/2019 0042     Assessment/Plan: 1. Anuric Oliguric AKI- in setting of rhabdomyolysis presumably due to cocaine and  alcohol use.  No history of loss of consciousness but unclear.  Renal US without obstruction.  Acute GN workup w/o sig positive findings.  ANA +, complements WNL, antiDNA antibody negative, ASO titer, ANCA, and anti GBM normal.   2. Rhabdomyolysis- presumably from cocaine/alcohol and possible LOC.  Follow CK levels. Remain elevated  3. 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 CAP/sepsis- on antibiotics per primary svc 5. Hyperkalemia-   EKG without peaked t-waves and will plan for HD as above.  6. Left arm weakness- possibly related to right cva vs rhabdo, neuro to evaluate 7. Polysubstance abuse- SW consulted.  Will likely need withdrawal protocol as she admits to drinking daily 8. Abnormal EKG with new partial  RBBB- per [facility]  Likely cocaine related.   HD today.  3h, 1L UF, Start 3K, f/u on AM Labs. No heparin.  Stop NS as no UOP, about 10kg up.  Might need to move fem HD cath to [facility].

## 2019-09-18 NOTE — Progress Notes (Signed)
Physical Therapy Treatment Patient Details Name: Alicia Flowers MRN: [id number] DOB: 30-Jun-1971 Today's Date: 09/18/2019    History of Present Illness Patient is a 48 year old female admitted 09/14/2019 with diagnosis of CAP. PMH:  tobacco abuse, alcohol abuse, cocaine abuse and marijuana use. Numbness and weakness in L arm past couple of months. CVA (+).    PT Comments    Patient demonstrates increased endurance/distance for ambulation with slow slightly labored cadence, has difficulty making turns using RW, has to lift RW to turn, limited secondary to c/o fatigue and tolerated sitting up in chair to eat lunch after therapy.  Patient will benefit from continued physical therapy in hospital and recommended venue below to increase strength, balance, endurance for safe ADLs and gait.    Follow Up Recommendations  SNF;Supervision for mobility/OOB;Supervision - Intermittent     Equipment Recommendations  Rolling walker with 5" wheels    Recommendations for Other Services       Precautions / Restrictions Precautions Precautions: Fall Restrictions Weight Bearing Restrictions: No    Mobility  Bed Mobility Overal bed mobility: Modified Independent             General bed mobility comments: increased time  Transfers Overall transfer level: Needs assistance Equipment used: Rolling walker (2 wheeled);None Transfers: Sit to/from [employer] to Stand: Min assist;Min guard Stand pivot transfers: Min assist;Min guard       General transfer comment: unsteady having to lean on nearby objects for support when not using AD, safer using RW  Ambulation/Gait     Assistive device: Rolling walker (2 wheeled) Gait Pattern/deviations: Decreased step length - right;Decreased step length - left;Decreased stride length Gait velocity: decreased   General Gait Details: slow labored cadence without loss of balance, has to lift RW when making turns, demonstrates  increased endurance/distance   Stairs             Wheelchair Mobility    Modified Rankin (Stroke Patients Only)       Balance Overall balance assessment: Needs assistance Sitting-balance support: Feet supported;No upper extremity supported Sitting balance-Leahy Scale: Good Sitting balance - Comments: seated at bedside   Standing balance support: During functional activity;No upper extremity supported Standing balance-Leahy Scale: Poor Standing balance comment: fair using RW                            Cognition Arousal/Alertness: Awake/alert Behavior During Therapy: WFL for tasks assessed/performed Overall Cognitive Status: Within Functional Limits for tasks assessed                                        Exercises General Exercises - Lower Extremity Long Arc Quad: Seated;AROM;Strengthening;Both;10 reps Hip Flexion/Marching: Seated;AROM;Strengthening;Both;10 reps Toe Raises: Seated;AROM;Strengthening;Both;10 reps Heel Raises: Seated;AROM;Strengthening;Both;10 reps    General Comments        Pertinent Vitals/Pain Pain Assessment: Faces Faces Pain Scale: Hurts even more Pain Location: left shoulder Pain Descriptors / Indicators: Sore Pain Intervention(s): Limited activity within patient's tolerance;Monitored during session    Home Living                      Prior Function            PT Goals (current goals can now be found in the care plan section) Acute Rehab PT Goals Patient Stated Goal: Be  able to reach top cupboards without pain. PT Goal Formulation: With patient/family Time For Goal Achievement: 09/30/19 Potential to Achieve Goals: Good Progress towards PT goals: Progressing toward goals    Frequency    Min 3X/week      PT Plan Current plan remains appropriate    Co-evaluation              AM-PAC PT "6 Clicks" Mobility   Outcome Measure  Help needed turning from your back to your side while  in a flat bed without using bedrails?: None Help needed moving from lying on your back to sitting on the side of a flat bed without using bedrails?: None Help needed moving to and from a bed to a chair (including a wheelchair)?: A Little Help needed standing up from a chair using your arms (e.g., wheelchair or bedside chair)?: A Little Help needed to walk in hospital room?: A Little Help needed climbing 3-5 steps with a railing? : A Lot 6 Click Score: 19    End of Session Equipment Utilized During Treatment: Gait belt Activity Tolerance: Patient tolerated treatment well;Patient limited by fatigue Patient left: in chair;with call bell/phone within reach Nurse Communication: Mobility status PT Visit Diagnosis: Unsteadiness on feet (R26.81);Other abnormalities of gait and mobility (R26.89);Muscle weakness (generalized) (M62.81) Hemiplegia - Right/Left: Left Hemiplegia - dominant/non-dominant: Non-dominant Hemiplegia - caused by: Cerebral infarction     Time: [id number] PT Time Calculation (min) (ACUTE ONLY): 25 min  Charges:  $Gait Training: 8-22 mins $Therapeutic Exercise: 8-22 mins                     3:55 PM, 09/18/19 [name], MPT Physical Therapist with [facility] [phone] office [phone] mobile phone

## 2019-09-18 NOTE — Consult Note (Signed)
[facility] Consult  Reason for Consult: Tunneled Dialysis Catheter  Referring Physician: Neprhology  Chief Complaint    Numbness      HPI: Alicia Flowers is a 48 y.o. female with a history of alcohol and cocaine abuse who presented to the hospital with left side numbness, feelings of being tired and decreased oral intake. She has been drinking and doing drugs the night before admission, and in the Ed was found to have acute kidney injury with elevated CK consistent with rhabdomyolysis, MRI positive for occipital lobe stroke, and CAP.  She has been dialyzed the past few days via femoral dialysis catheter and her creatinine remains elevated. She remains oliguric despite attempts at diuresis.    The patient denies any prior HTN or diabetes, and says this does run in her family.  She was cocaine positive on admission urine drug screen.   Past Medical History:  Diagnosis Date  . Tennis elbow     Past Surgical History:  Procedure Laterality Date  . APPENDECTOMY    . CENTRAL VENOUS CATHETER INSERTION N/A 09/15/2019   Procedure: MINOR INSERTION CENTRAL LINE ADULT;  Surgeon: Aviva Signs, MD;  Location: [facility];  Service: General;  Laterality: N/A;  . TUBAL LIGATION      History reviewed. No pertinent family history.  Reported history of HTN and Diabetes in parents.   Social History   Tobacco Use  . Smoking status: Current Every Day Smoker    Packs/day: 0.50    Types: Cigarettes  . Smokeless tobacco: Never Used  Substance Use Topics  . Alcohol use: Yes    Comment: socially  . Drug use: Yes    Types: Marijuana    Comment: last used last night    Medications:  I have reviewed the patient's current medications. Prior to Admission:  Medications Prior to Admission  Medication Sig Dispense Refill Last Dose  . aspirin 325 MG tablet Take 325 mg by mouth 3 (three) times a week.    09/12/2019  . diphenoxylate-atropine (LOMOTIL) 2.5-0.025 MG per tablet Take 1  tablet by mouth 4 (four) times daily as needed for diarrhea or loose stools. 30 tablet 0   . naphazoline-glycerin (CLEAR EYES) 0.012-0.2 % SOLN Place 1-2 drops into both eyes every 4 (four) hours as needed. Redness   09/13/2019 at Unknown time  . naproxen sodium (ALEVE) 220 MG tablet Take 440 mg by mouth 2 (two) times daily as needed (tennis elbow). Pain   09/13/2019 at Unknown time   Scheduled: . aspirin EC  81 mg Oral Daily  . atorvastatin  20 mg Oral q1800  . Chlorhexidine Gluconate Cloth  6 each Topical Once   And  . Chlorhexidine Gluconate Cloth  6 each Topical Once  . Chlorhexidine Gluconate Cloth  6 each Topical Q0600  . Chlorhexidine Gluconate Cloth  6 each Topical Q0600  . folic acid  1 mg Oral Daily  . heparin injection (subcutaneous)  5,000 Units Subcutaneous Q8H  . insulin aspart  0-5 Units Subcutaneous QHS  . insulin aspart  0-9 Units Subcutaneous TID WC  . levothyroxine  50 mcg Oral Q0600  . multivitamin with minerals  1 tablet Oral Daily  . nicotine  14 mg Transdermal Daily  . pantoprazole  40 mg Oral Daily  . sodium chloride flush  3 mL Intravenous Q12H  . thiamine  100 mg Oral Daily   Or  . thiamine  100 mg Intravenous Daily   Continuous: . sodium chloride    .  sodium chloride    . sodium chloride    . cefTRIAXone (ROCEPHIN)  IV 2 g (09/18/19 1322)  . methylPREDNISolone (SOLU-MEDROL) injection 500 mg (09/18/19 1027)   [id number] chloride, sodium chloride, sodium chloride, alteplase, heparin, heparin, lidocaine (PF), lidocaine-prilocaine, ondansetron (ZOFRAN) IV, pentafluoroprop-tetrafluoroeth, sodium chloride flush, traMADol  No Known Allergies   ROS:  A comprehensive review of systems was negative except for: Constitutional: positive for tired, nervous Genitourinary: positive for oliguria Musculoskeletal: positive for numbness left side  Blood pressure (!) 154/94, pulse 76, temperature 97.8 F (36.6 C), resp. rate 17, height 5\' 3"  (1.6 m), weight 82 kg, last  menstrual period 09/07/2019, SpO2 99 %. Physical Exam Vitals signs reviewed.  Constitutional:      Appearance: Normal appearance.  HENT:     Head: Normocephalic and atraumatic.     Nose: Nose normal.     Mouth/Throat:     Mouth: Mucous membranes are moist.  Eyes:     Extraocular Movements: Extraocular movements intact.     Pupils: Pupils are equal, round, and reactive to light.  Neck:     Musculoskeletal: Normal range of motion.  Cardiovascular:     Rate and Rhythm: Normal rate.  Pulmonary:     Effort: Pulmonary effort is normal.  Abdominal:     General: There is no distension.     Palpations: Abdomen is soft.     Tenderness: There is no abdominal tenderness.  Musculoskeletal:     Right lower leg: No edema.     Left lower leg: No edema.     Comments: Right femoral dialysis catheter in place  Skin:    General: Skin is warm.  Neurological:     General: No focal deficit present.     Mental Status: She is alert.  Psychiatric:        Mood and Affect: Mood normal.        Thought Content: Thought content normal.        Judgment: Judgment normal.     Results: Results for orders placed or performed during the hospital encounter of 09/14/19 (from the past 48 hour(s))  Glucose, capillary     Status: Abnormal   Collection Time: 09/16/19  4:10 PM  Result Value Ref Range   Glucose-Capillary 136 (H) 70 - 99 mg/dL  Glucose, capillary     Status: Abnormal   Collection Time: 09/16/19  9:28 PM  Result Value Ref Range   Glucose-Capillary 134 (H) 70 - 99 mg/dL   Comment 1 Notify RN    Comment 2 Document in Chart   CBC with Differential/Platelet     Status: Abnormal   Collection Time: 09/17/19 12:42 AM  Result Value Ref Range   WBC 14.8 (H) 4.0 - 10.5 K/uL   RBC 3.80 (L) 3.87 - 5.11 MIL/uL   Hemoglobin 11.7 (L) 12.0 - 15.0 g/dL   HCT 34.6 (L) 36.0 - 46.0 %   MCV 91.1 80.0 - 100.0 fL   MCH 30.8 26.0 - 34.0 pg   MCHC 33.8 30.0 - 36.0 g/dL   RDW 15.1 11.5 - 15.5 %   Platelets 176  150 - 400 K/uL   nRBC 0.1 0.0 - 0.2 %   Neutrophils Relative % 93 %   Neutro Abs 13.8 (H) 1.7 - 7.7 K/uL   Lymphocytes Relative 4 %   Lymphs Abs 0.6 (L) 0.7 - 4.0 K/uL   Monocytes Relative 2 %   Monocytes Absolute 0.4 0.1 - 1.0 K/uL   Eosinophils Relative  0 %   Eosinophils Absolute 0.0 0.0 - 0.5 K/uL   Basophils Relative 0 %   Basophils Absolute 0.0 0.0 - 0.1 K/uL   Immature Granulocytes 1 %   Abs Immature Granulocytes 0.07 0.00 - 0.07 K/uL    Comment: Performed at [facility], [address], [place], [place] [address]  Comprehensive metabolic panel     Status: Abnormal   Collection Time: 09/17/19  6:46 AM  Result Value Ref Range   Sodium 134 (L) 135 - 145 mmol/L   Potassium 3.9 3.5 - 5.1 mmol/L    Comment: DELTA CHECK NOTED   Chloride 99 98 - 111 mmol/L   CO2 22 22 - 32 mmol/L   Glucose, Bld 137 (H) 70 - 99 mg/dL   BUN 53 (H) 6 - 20 mg/dL   Creatinine, Ser 4.77 (H) 0.44 - 1.00 mg/dL   Calcium 7.2 (L) 8.9 - 10.3 mg/dL   Total Protein 5.7 (L) 6.5 - 8.1 g/dL   Albumin 2.5 (L) 3.5 - 5.0 g/dL   AST 431 (H) 15 - 41 U/L   ALT 393 (H) 0 - 44 U/L   Alkaline Phosphatase 84 38 - 126 U/L   Total Bilirubin 0.5 0.3 - 1.2 mg/dL   GFR calc non Af Amer 10 (L) >60 mL/min   GFR calc Af Amer 12 (L) >60 mL/min   Anion gap 13 5 - 15    Comment: Performed at [facility], [address], [place], [place] [address]  CK     Status: Abnormal   Collection Time: 09/17/19  6:46 AM  Result Value Ref Range   Total CK 24,838 (H) 38 - 234 U/L    Comment: RESULTS CONFIRMED BY MANUAL DILUTION Performed at [facility], [address], [place], [place] [address]   Glucose, capillary     Status: Abnormal   Collection Time: 09/17/19  7:48 AM  Result Value Ref Range   Glucose-Capillary 144 (H) 70 - 99 mg/dL  Glucose, capillary     Status: Abnormal   Collection Time: 09/17/19 11:09 AM  Result Value Ref Range   Glucose-Capillary 148 (H) 70 - 99 mg/dL  Glucose, capillary     Status: Abnormal   Collection  Time: 09/17/19  4:06 PM  Result Value Ref Range   Glucose-Capillary 134 (H) 70 - 99 mg/dL  Glucose, capillary     Status: Abnormal   Collection Time: 09/17/19  9:30 PM  Result Value Ref Range   Glucose-Capillary 147 (H) 70 - 99 mg/dL  CK     Status: Abnormal   Collection Time: 09/18/19  6:21 AM  Result Value Ref Range   Total CK 22,451 (H) 38 - 234 U/L    Comment: RESULTS CONFIRMED BY MANUAL DILUTION Performed at [facility], [address], [place], [place] [address]   Renal function panel     Status: Abnormal   Collection Time: 09/18/19  6:21 AM  Result Value Ref Range   Sodium 132 (L) 135 - 145 mmol/L   Potassium 4.4 3.5 - 5.1 mmol/L   Chloride 97 (L) 98 - 111 mmol/L   CO2 22 22 - 32 mmol/L   Glucose, Bld 121 (H) 70 - 99 mg/dL   BUN 79 (H) 6 - 20 mg/dL   Creatinine, Ser 6.29 (H) 0.44 - 1.00 mg/dL   Calcium 7.1 (L) 8.9 - 10.3 mg/dL   Phosphorus 7.6 (H) 2.5 - 4.6 mg/dL   Albumin 2.8 (L) 3.5 - 5.0 g/dL  GFR calc non Af Amer 7 (L) >60 mL/min   GFR calc Af Amer 8 (L) >60 mL/min   Anion gap 13 5 - 15    Comment: Performed at [facility], [address], [place], [place] [address]  CBC     Status: Abnormal   Collection Time: 09/18/19  6:21 AM  Result Value Ref Range   WBC 13.9 (H) 4.0 - 10.5 K/uL   RBC 3.52 (L) 3.87 - 5.11 MIL/uL   Hemoglobin 10.9 (L) 12.0 - 15.0 g/dL   HCT 31.8 (L) 36.0 - 46.0 %   MCV 90.3 80.0 - 100.0 fL   MCH 31.0 26.0 - 34.0 pg   MCHC 34.3 30.0 - 36.0 g/dL   RDW 14.8 11.5 - 15.5 %   Platelets 177 150 - 400 K/uL   nRBC 0.1 0.0 - 0.2 %    Comment: Performed at [facility], [address] [place], [place] [address]  Glucose, capillary     Status: Abnormal   Collection Time: 09/18/19  7:25 AM  Result Value Ref Range   Glucose-Capillary 122 (H) 70 - 99 mg/dL  Glucose, capillary     Status: Abnormal   Collection Time: 09/18/19 11:55 AM  Result Value Ref Range   Glucose-Capillary 108 (H) 70 - 99 mg/dL    US Venous Img Upper Uni Left  Result Date:  09/17/2019 CLINICAL DATA:  Edema, pain EXAM: LEFT UPPER EXTREMITY VENOUS DOPPLER ULTRASOUND TECHNIQUE: Gray-scale sonography with graded compression, as well as color Doppler and duplex ultrasound were performed to evaluate the upper extremity deep venous system from the level of the subclavian vein and including the jugular, axillary, basilic, radial, ulnar and upper cephalic vein. Spectral Doppler was utilized to evaluate flow at rest and with distal augmentation maneuvers. COMPARISON:  None. FINDINGS: Contralateral Subclavian Vein: Respiratory phasicity is normal and symmetric with the symptomatic side. No evidence of thrombus. Normal compressibility. Internal Jugular Vein: No evidence of thrombus. Normal compressibility, respiratory phasicity and response to augmentation. Subclavian Vein: No evidence of thrombus. Normal compressibility, respiratory phasicity and response to augmentation. Axillary Vein: No evidence of thrombus. Normal compressibility, respiratory phasicity and response to augmentation. Cephalic Vein: No evidence of thrombus. Normal compressibility, respiratory phasicity and response to augmentation. Basilic Vein: No evidence of thrombus. Normal compressibility, respiratory phasicity and response to augmentation. Brachial Veins: No evidence of thrombus. Normal compressibility, respiratory phasicity and response to augmentation. Radial Veins: No evidence of thrombus. Normal compressibility, respiratory phasicity and response to augmentation. Ulnar Veins: No evidence of thrombus. Normal compressibility, respiratory phasicity and response to augmentation. Venous Reflux:  None visualized. Other Findings:  Subcutaneous edema noted in the forearm. IMPRESSION: No evidence of DVT within the left upper extremity. Electronically Signed   By: Lucrezia Europe M.D.   On: 09/17/2019 11:27     Assessment & Plan:  Alicia Flowers is a 48 y.o. female with anuric oliguric acute kidney injury in the setting of  rhabdomyolysis who needs longterm dialysis access. She is currently dialyzing via a femoral catheter.  - Consent obtained, discussed tunneled dialysis catheter and risk of bleeding, infection, injury to vessel, malfunction, pneumothorax, discussed need for blood drug screen to confirm cocaine out of her system prior to anesthesia - NPO midnight - Ancef OCTOR - Tunneled catheter 09/19/2019 @ 12noon timeframe   All questions were answered to the satisfaction of the patient.   Virl Cagey 09/18/2019, 3:13 PM

## 2019-09-18 NOTE — Procedures (Signed)
    HEMODIALYSIS TREATMENT NOTE:  HD initiated this admission on 09/15/19.  This is her third dialysis session and she reports no problems with the first two treatments.  Right femoral catheter biopatch dressing is intact.  Catheter supports blood flow of 300cc/min with lines reversed.  3 hour heparin-free treatment completed without complications.  Goal met: 1 liter removed without interruption in ultrafiltration.  All blood was returned.  Plans for tunneled catheter placement tomorrow.  Angela Poteat, RN 

## 2019-09-19 ENCOUNTER — Encounter (HOSPITAL_COMMUNITY)
Admission: EM | Disposition: A | Discharge status: Home Health | Payer: Self-pay | Source: Home / Self Care | Attending: Internal Medicine

## 2019-09-19 ENCOUNTER — Inpatient Hospital Stay (HOSPITAL_COMMUNITY): Payer: Medicaid Other | Admitting: Anesthesiology

## 2019-09-19 LAB — CBC
HCT: 28.3 % — ABNORMAL LOW (ref 36.0–46.0)
Hemoglobin: 9.9 g/dL — ABNORMAL LOW (ref 12.0–15.0)
MCH: 30.7 pg (ref 26.0–34.0)
MCHC: 35 g/dL (ref 30.0–36.0)
MCV: 87.9 fL (ref 80.0–100.0)
Platelets: 147 10*3/uL — ABNORMAL LOW (ref 150–400)
RBC: 3.22 MIL/uL — ABNORMAL LOW (ref 3.87–5.11)
RDW: 14 % (ref 11.5–15.5)
WBC: 12 10*3/uL — ABNORMAL HIGH (ref 4.0–10.5)
nRBC: 0.2 % (ref 0.0–0.2)

## 2019-09-19 LAB — RENAL FUNCTION PANEL
Albumin: 2.5 g/dL — ABNORMAL LOW (ref 3.5–5.0)
Anion gap: 11 (ref 5–15)
BUN: 69 mg/dL — ABNORMAL HIGH (ref 6–20)
CO2: 23 mmol/L (ref 22–32)
Calcium: 7 mg/dL — ABNORMAL LOW (ref 8.9–10.3)
Chloride: 96 mmol/L — ABNORMAL LOW (ref 98–111)
Creatinine, Ser: 5.79 mg/dL — ABNORMAL HIGH (ref 0.44–1.00)
GFR calc Af Amer: 9 mL/min — ABNORMAL LOW (ref >60)
GFR calc non Af Amer: 8 mL/min — ABNORMAL LOW (ref >60)
Glucose, Bld: 132 mg/dL — ABNORMAL HIGH (ref 70–99)
Phosphorus: 5.6 mg/dL — ABNORMAL HIGH (ref 2.5–4.6)
Potassium: 4 mmol/L (ref 3.5–5.1)
Sodium: 130 mmol/L — ABNORMAL LOW (ref 135–145)

## 2019-09-19 LAB — CK: Total CK: 12683 U/L — ABNORMAL HIGH (ref 38–234)

## 2019-09-19 LAB — GLUCOSE, CAPILLARY
Glucose-Capillary: 136 mg/dL — ABNORMAL HIGH (ref 70–99)
Glucose-Capillary: 120 mg/dL — ABNORMAL HIGH (ref 70–99)
Glucose-Capillary: 135 mg/dL — ABNORMAL HIGH (ref 70–99)
Glucose-Capillary: 141 mg/dL — ABNORMAL HIGH (ref 70–99)

## 2019-09-19 LAB — RAPID URINE DRUG SCREEN, HOSP PERFORMED
Amphetamines: NOT DETECTED
Barbiturates: NOT DETECTED
Benzodiazepines: NOT DETECTED
Cocaine: POSITIVE — AB
Opiates: NOT DETECTED
Tetrahydrocannabinol: POSITIVE — AB

## 2019-09-19 LAB — HEPATITIS B SURFACE ANTIBODY, QUANTITATIVE: Hep B S AB Quant (Post): <3.1 m[IU]/mL — ABNORMAL LOW (ref >9.9)

## 2019-09-19 LAB — HEPATITIS B CORE ANTIBODY, TOTAL: Hep B Core Total Ab: NEGATIVE

## 2019-09-19 SURGERY — INSERTION OF DIALYSIS CATHETER
Anesthesia: Choice

## 2019-09-19 MED ORDER — LORAZEPAM 2 MG/ML IJ SOLN
1.0000 mg | Freq: Once | INTRAMUSCULAR | Status: AC
  Administered 2019-09-19: 15:00:00 1 mg via INTRAVENOUS
  Filled 2019-09-19: qty 1

## 2019-09-19 MED ORDER — ALPRAZOLAM 0.25 MG PO TABS
0.2500 mg | ORAL_TABLET | Freq: Three times a day (TID) | ORAL | Status: DC | PRN
  Administered 2019-09-21 – 2019-09-27 (×6): 0.25 mg via ORAL
  Filled 2019-09-19 (×6): qty 1

## 2019-09-19 MED ORDER — CEFAZOLIN SODIUM-DEXTROSE 2-4 GM/100ML-% IV SOLN: 2.0000 g | INTRAVENOUS | Status: DC

## 2019-09-19 MED ORDER — PREDNISONE 20 MG PO TABS
100.0000 mg | ORAL_TABLET | Freq: Every day | ORAL | Status: DC
  Administered 2019-09-22 – 2019-09-26 (×5): 100 mg via ORAL
  Filled 2019-09-19 (×7): qty 5

## 2019-09-19 MED ORDER — CHLORHEXIDINE GLUCONATE CLOTH 2 % EX PADS: 6.0000 | MEDICATED_PAD | Freq: Once | CUTANEOUS | Status: AC

## 2019-09-19 MED ORDER — CHLORHEXIDINE GLUCONATE CLOTH 2 % EX PADS
6.0000 | MEDICATED_PAD | Freq: Once | CUTANEOUS | Status: AC
  Administered 2019-09-19: 11:00:00 6 via TOPICAL

## 2019-09-19 MED ORDER — MIDAZOLAM HCL 2 MG/2ML IJ SOLN
INTRAMUSCULAR | Status: AC
  Filled 2019-09-19: qty 2

## 2019-09-19 NOTE — Progress Notes (Signed)
[facility]  No temporary IJ available and patient would be best served with tunneled IJ at this time. Has the femoral line in place.  In and out without any urine. Patient is making very minimal urine. Blood toxicology is pending still.  If we could get a sample of urine adequate enough to send before tomorrow would plan for the tunneled catheter.  Otherwise may need a temporary catheter placed in IJ versus going to IR to see if they could place tunneled despite having cocaine positive testing.  Have contacted IR and will see what they say.  Diet for now. NPO midnight. Attempt to get urine sample and will try another I&O in the AM if unsuccessful between now and then.   Curlene Labrum, MD [facility] [address] [place], [place] [address] [phone] (office)

## 2019-09-19 NOTE — Progress Notes (Signed)
[facility]   Trying to get urine toxicology as patient feels like she can make some urine. RN attempting now.  If able to will run stat.  Hopefully can still proceed with the tunneled catheter.   Curlene Labrum, MD [facility] [address] [place], [place] [address] [phone] (office)

## 2019-09-19 NOTE — Progress Notes (Signed)
PROGRESS NOTE    Alicia Flowers  [id number] DOB: 1971/01/08 DOA: 09/14/2019 PCP: Patient, No Pcp Per   Brief Narrative:  Per HPI: 48 y.o.femalewith a medical history significant for tobacco abuse, alcohol abuse, cocaine abuse and marijuana use;who presented to the hospital secondary to worsening numbness in her left upper extremity and left leg. Patient reports his symptoms have been present for approximately 3 months but worsening in the last 2 to 3 days prior to admission. Patient also expressed feeling slightly more tired and having decrease oral intake.No chest pain, no nausea, no vomiting, no productive cough, no sick contacts. Patient reports to continue smoking about half pack per day and is drinking on daily basis. Last use of marijuana was the night prior to admission.  In the ED patient was found to be mildly hypoxic, with elevated WBCs, acute renal failure with a creatinine close to 5, hyperkalemic, hypophosphatemic with a normal radiologic studies suggesting community-acquired pneumonia. She also has a positive MRI demonstrating right occipital stroke. Cultures were taken, sodium bicarbonate, insulin and calcium gluconate provided for hyperkalemia.Patient is started on IV antibiotics and [facility] has been contacted to admit patient for further evaluation and management.  9/22: Patient continues to have left arm swelling and pain.  And was due to have tunneled catheter placement for hemodialysis tomorrow.  Unfortunately, blood toxicology results will take approximately 5 days to return and anesthesia unwilling to place patient under sedation for this procedure without appropriate toxicology.  Urine drug screen is being attempted if patient will make enough urine to check.  Appreciate general surgery who may have to place IJ in the interim for dialysis as femoral line cannot stay in place for longer.   Assessment & Plan:   Principal Problem:   CAP (community acquired  pneumonia) Active Problems:   Acute renal failure (ARF) (HCC)   Hypoxia   Alcohol abuse   Cocaine abuse (HCC)   Hyperkalemia   Metabolic acidosis   Hyperphosphatemia   Elevated troponin   Oliguria and anuria   1-hypoxia/community-acquired pneumonia-improved -Afebrile -WBCs were slowly trending down; anticipated confusion information after initiation of steroids as recommended by neurology service. -Reports no chest pain -Continue flutter valve, good oxygen saturation on room air currently.  -Completed 5-day course of antibiotics with Rocephin  2-acute renal failure/hyperkalemiahypocalcemia -Continue avoiding nephrotoxic agents -patient remains anuric. -Creatinine down after HD; still without urine output.  -Femoral catheter in place, tolerated first HD tx on 9/18; second treatment provided on (9/19); most likely HD again later today, but will follow renal service rec's.   -Continue supportive care. -Follow renal function trend. -Patient renal failure appears to be secondary to rhabdomyolysis. -Renal ultrasound without obstructive uropathy.  3-history of alcohol abuse-no active withdrawal appreciated -Continue CIWA protocol -Continue thiamine and folic acid.  4-tobacco abuse, cocaine abuse/marijuana use -Cessation counseling has been provided -Continue nicotine patch  5-right occipital stroke/concerns for myositis  -Patient presenting with left numbness affecting left side -Abnormal electrolytes and renal failure slightly contributed to left side weakness/numbness. -Follow results from dialysis trial -Continue electrolytes repletion as needed -Followin neurology service recommendations patient has been started on IV steroids.  Outpatient and electromyography recommended. -Will continue aspirin for secondary prevention and also statins -LDL of 80 and HDL 31; triglycerides level 241. -A1c 5.3. -Normal B12 level and elevated TSH. -CK level trending down  6-elevated  TSH/hypothyroidism -Started on Synthroid and will recommend repeat thyroid profile in 4-6 weeks.  7-mixed hyperlipidemia -Hold Lipitor for now given rhabdomyolysis/elevated LFTs  8-elevated troponin -No chest pain -No acute ischemic changes appreciated on telemetry or EKG -Reassuring 2D echo with normal ejection fraction and no wall motion abnormalities. -Patient's elevated troponin most likely associated with ongoing rhabdomyolysis and renal failure. -Continue statins and aspirin. -no further acute inpatient cardiac work up.  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 -Alcohol cessation counseling has been provided. -Trend with repeat CBC in a.m.  10- rhabdomyolysis likely secondary to cocaine use/alcohol use versus necrotizing myopathy -Repeat toxicology pending -Appreciate general surgery assistance regarding tunneled catheter placement -May require IJ placement for temporary dialysis in the interim -IV Solu-Medrol decreased to oral prednisone 100 mg daily which will need to be ongoing for several months according to neurology  11-polysubstance abuse -Awaiting urine toxicology versus repeat urine drug testing if patient can make urine   DVT prophylaxis: heparin  Code Status: Full code. Family Communication: No family at bedside. Disposition Plan:  Continue close monitoring.  Appreciate general surgery for change in IV access to assist with dialysis in a.m.  IV Solu-Medrol to oral prednisone.  Consultants:   Nephrology service  Neurology  Cardiology service has been curbside (Dr. Bronson Ing)  General Surgery  Procedures:   See below for x-ray reports.   Antimicrobials:  Anti-infectives (From admission, onward)   Start     Dose/Rate Route Frequency Ordered Stop   09/19/19 0830   ceFAZolin (ANCEF) IVPB 2g/100 mL premix  Status:  Discontinued     2 g 200 mL/hr over 30 Minutes Intravenous On call to O.R. 09/19/19 0816 09/19/19 1338   09/15/19 1230  cefTRIAXone (ROCEPHIN) 2 g in sodium chloride 0.9 % 100 mL IVPB     2 g 200 mL/hr over 30 Minutes Intravenous Every 24 hours 09/14/19 1228 09/19/19 1249   09/15/19 1200  azithromycin (ZITHROMAX) tablet 500 mg     500 mg Oral Daily 09/15/19 1024 09/18/19 0827   09/14/19 1230  cefTRIAXone (ROCEPHIN) 2 g in sodium chloride 0.9 % 100 mL IVPB  Status:  Discontinued     2 g 200 mL/hr over 30 Minutes Intravenous Every 24 hours 09/14/19 1216 09/14/19 1228   09/14/19 1230  azithromycin (ZITHROMAX) 500 mg in sodium chloride 0.9 % 250 mL IVPB  Status:  Discontinued     500 mg 250 mL/hr over 60 Minutes Intravenous Every 24 hours 09/14/19 1216 09/15/19 1024   09/14/19 1230  cefTRIAXone (ROCEPHIN) 1 g in sodium chloride 0.9 % 100 mL IVPB     1 g 200 mL/hr over 30 Minutes Intravenous  Once 09/14/19 1229 09/14/19 1421   09/14/19 1200  doxycycline (VIBRAMYCIN) 100 mg in sodium chloride 0.9 % 250 mL IVPB     100 mg 125 mL/hr over 120 Minutes Intravenous  Once 09/14/19 1157 09/15/19 1011   09/14/19 1100  cefTRIAXone (ROCEPHIN) 1 g in sodium chloride 0.9 % 100 mL IVPB     1 g 200 mL/hr over 30 Minutes Intravenous  Once 09/14/19 1046 09/14/19 1206       Subjective: Patient seen and evaluated today with complaints of ongoing left upper extremity pain and swelling.  She did have 1 L of ultrafiltration yesterday.  Objective: Vitals:   09/18/19 1955 09/18/19 2040 09/19/19 0540 09/19/19 0817  BP: (!) 159/94 (!) 160/93 (!) 148/91   Pulse: 81 86 77   Resp:  16 16   Temp:  99.2 F (37.3 C) 98.9 F (37.2 C)   TempSrc:  Oral Oral   SpO2:  100% 100% 100%  Weight:      Height:        Intake/Output Summary (Last 24 hours) at 09/19/2019 1432 Last data filed at 09/19/2019 1000 Gross per 24 hour  Intake 240 ml  Output 1500 ml  Net -1260  ml   Filed Weights   09/16/19 1010 09/16/19 1319 09/18/19 1645  Weight: 83 kg 82 kg 84.1 kg    Examination:  General exam: Minimal distress Respiratory system: Clear to auscultation. Respiratory effort normal. Cardiovascular system: S1 & S2 heard, RRR. No JVD, murmurs, rubs, gallops or clicks. No pedal edema. Gastrointestinal system: Abdomen is nondistended, soft and nontender. No organomegaly or masses felt. Normal bowel sounds heard. Central nervous system: Somnolent but arousable Extremities: Symmetric 5 x 5 power.  Left upper extremities with swelling and tenderness. Skin: No rashes, lesions or ulcers Psychiatry: Flat affect    Data Reviewed: I have personally reviewed following labs and imaging studies  CBC: Recent Labs  Lab 09/14/19 0819 09/14/19 1210 09/15/19 0545 09/17/19 0042 09/18/19 0621 09/19/19 0502  WBC 14.4* 13.5* 13.5* 14.8* 13.9* 12.0*  NEUTROABS 12.2*  --   --  13.8*  --   --   HGB 15.7* 15.3* 13.7 11.7* 10.9* 9.9*  HCT 50.9* 49.1* 40.5 34.6* 31.8* 28.3*  MCV 98.8 99.0 91.4 91.1 90.3 87.9  PLT 375 329 267 176 177 [id number]*   Basic Metabolic Panel: Recent Labs  Lab 09/14/19 1153 09/14/19 1210  09/15/19 0545 09/16/19 0729 09/17/19 0646 09/18/19 0621 09/19/19 0502  NA  --  134*   < > 134* 138 134* 132* 130*  K  --  6.9*   < > 6.0* 4.9 3.9 4.4 4.0  CL  --  102   < > 98 101 99 97* 96*  CO2  --  17*   < > 22 24 22 22 23   GLUCOSE  --  145*   < > 126* 94 137* 121* 132*  BUN  --  51*   < > 65* 55* 53* 79* 69*  CREATININE  --  4.70*   < > 5.22* 4.82* 4.77* 6.29* 5.79*  CALCIUM  --  6.5*   < > 5.6* 6.4* 7.2* 7.1* 7.0*  MG 2.5* 2.7*  --   --   --   --   --   --   PHOS  --  10.5*  --   --   --   --  7.6* 5.6*   < > = values in this interval not displayed.   GFR: Estimated Creatinine Clearance: 12.3 mL/min (A) (by C-G formula based on SCr of 5.79 mg/dL (H)). Liver Function Tests: Recent Labs  Lab 09/14/19 1210 09/14/19 1727 09/15/19 0545 09/16/19  0729 09/17/19 0646 09/18/19 0621 09/19/19 0502  AST 2,046* 1,780* 1,187* 576* 431*  --   --   ALT 959* 898* 709* 475* 393*  --   --   ALKPHOS 108 131* 115 98 84  --   --   BILITOT 0.4 0.3 0.3 0.7 0.5  --   --   PROT 7.5 6.9 6.2* 5.6* 5.7*  --   --   ALBUMIN 3.5 3.3* 2.9* 2.4* 2.5* 2.8* 2.5*   Recent Labs  Lab 09/14/19 1153  LIPASE 27   Recent Labs  Lab 09/14/19 1153  AMMONIA 27   Coagulation Profile: Recent Labs  Lab 09/14/19 0819  INR 1.1   Cardiac Enzymes: Recent Labs  Lab 09/14/19 1727 09/16/19 1135 09/17/19 0646 09/18/19 0621 09/19/19 0502  CKTOTAL >50,000* 45,186* [id number]* 22,451* 12,683*   BNP (last 3 results) No results for input(s): PROBNP in the last 8760 hours. HbA1C: No results for input(s): HGBA1C in the last 72 hours. CBG: Recent Labs  Lab 09/18/19 0725 09/18/19 1155 09/18/19 1654 09/19/19 0722 09/19/19 1111  GLUCAP 122* 108* 117* 136* 120*   Lipid Profile: No results for input(s): CHOL, HDL, LDLCALC, TRIG, CHOLHDL, LDLDIRECT in the last 72 hours. Thyroid Function Tests: No results for input(s): TSH, T4TOTAL, FREET4, T3FREE, THYROIDAB in the last 72 hours. Anemia Panel: No results for input(s): VITAMINB12, FOLATE, FERRITIN, TIBC, IRON, RETICCTPCT in the last 72 hours. Sepsis Labs: Recent Labs  Lab 09/14/19 1154  LATICACIDVEN 2.4*    Recent Results (from the past 240 hour(s))  Urine culture     Status: Abnormal   Collection Time: 09/14/19  8:41 AM   Specimen: Urine, Random  Result Value Ref Range Status   Specimen Description   Final    URINE, RANDOM Performed at [facility], [address] [place], [place] [address]    Special Requests   Final    NONE Performed at [facility], [address], [place], [place] [address]    Culture (A)  Final    30,000 COLONIES/mL GROUP B STREP(S.AGALACTIAE)ISOLATED TESTING AGAINST S. AGALACTIAE NOT ROUTINELY PERFORMED DUE TO PREDICTABILITY OF AMP/PEN/VAN SUSCEPTIBILITY. Performed at [facility], [place] [address], [place], [place] [address]    Report Status 09/16/2019 FINAL  Final  SARS Coronavirus 2 [facility] order, Performed in [facility] hospital lab) Nasopharyngeal Nasopharyngeal Swab     Status: None   Collection Time: 09/14/19 10:04 AM   Specimen: Nasopharyngeal Swab  Result Value Ref Range Status   SARS Coronavirus 2 NEGATIVE NEGATIVE Final    Comment: (NOTE) If result is NEGATIVE SARS-CoV-2 target nucleic acids are NOT DETECTED. The SARS-CoV-2 RNA is generally detectable in upper and lower  respiratory specimens during the acute phase of infection. The lowest  concentration of SARS-CoV-2 viral copies this assay can detect is 250  copies / mL. A negative result does not preclude SARS-CoV-2 infection  and should not be used as the sole basis for treatment or other  patient management decisions.  A negative result may occur with  improper specimen collection / handling, submission of specimen other  than nasopharyngeal swab, presence of viral mutation(s) within the  areas targeted by this assay, and inadequate number of viral copies  (<250 copies / mL). A negative result must be combined with clinical  observations, patient history, and epidemiological information. If result is POSITIVE SARS-CoV-2 target nucleic acids are DETECTED. The SARS-CoV-2 RNA is generally detectable in upper and lower  respiratory specimens dur ing the acute phase of infection.  Positive  results are indicative of active infection with SARS-CoV-2.  Clinical  correlation with patient history and other diagnostic information is  necessary to determine patient infection status.  Positive results do  not rule out bacterial infection or co-infection with other viruses. If result is PRESUMPTIVE POSTIVE SARS-CoV-2 nucleic acids MAY BE PRESENT.   A presumptive positive result was obtained on the submitted specimen  and confirmed on repeat testing.  While 2019 novel coronavirus   (SARS-CoV-2) nucleic acids may be present in the submitted sample  additional confirmatory testing may be necessary for epidemiological  and / or clinical management purposes  to differentiate  between  SARS-CoV-2 and other Sarbecovirus currently known to infect humans.  If clinically indicated additional testing with an alternate test  methodology [phone]) is advised. The SARS-CoV-2 RNA is generally  detectable in upper and lower respiratory sp ecimens during the acute  phase of infection. The expected result is Negative. Fact Sheet for Patients:  StrictlyIdeas.no Fact Sheet for Healthcare Providers: BankingDealers.co.za This test is not yet approved or cleared by the [place] FDA and has been authorized for detection and/or diagnosis of SARS-CoV-2 by FDA under an Emergency Use Authorization (EUA).  This EUA will remain in effect (meaning this test can be used) for the duration of the COVID-19 declaration under Section 564(b)(1) of the Act, 21 U.S.C. section 360bbb-3(b)(1), unless the authorization is terminated or revoked sooner. Performed at [facility], [address], [place], [place] [address]   MRSA PCR Screening     Status: None   Collection Time: 09/15/19  8:45 AM   Specimen: Nasal Mucosa; Nasopharyngeal  Result Value Ref Range Status   MRSA by PCR NEGATIVE NEGATIVE Final    Comment:        The GeneXpert MRSA Assay (FDA approved for NASAL specimens only), is one component of a comprehensive MRSA colonization surveillance program. It is not intended to diagnose MRSA infection nor to guide or monitor treatment for MRSA infections. Performed at [facility], [address], [place], [place] [address]          Radiology Studies: No results found.      Scheduled Meds: . aspirin EC  81 mg Oral Daily  . atorvastatin  20 mg Oral q1800  . Chlorhexidine Gluconate Cloth  6 each Topical Once   And  .  Chlorhexidine Gluconate Cloth  6 each Topical Once  . Chlorhexidine Gluconate Cloth  6 each Topical Q0600  . Chlorhexidine Gluconate Cloth  6 each Topical Q0600  . folic acid  1 mg Oral Daily  . heparin injection (subcutaneous)  5,000 Units Subcutaneous Q8H  . insulin aspart  0-5 Units Subcutaneous QHS  . insulin aspart  0-9 Units Subcutaneous TID WC  . levothyroxine  50 mcg Oral Q0600  . LORazepam  1 mg Intravenous Once  . multivitamin with minerals  1 tablet Oral Daily  . nicotine  14 mg Transdermal Daily  . pantoprazole  40 mg Oral Daily  . [START ON 09/20/2019] predniSONE  100 mg Oral Q breakfast  . sodium chloride flush  3 mL Intravenous Q12H  . thiamine  100 mg Oral Daily   Or  . thiamine  100 mg Intravenous Daily   Continuous Infusions: . sodium chloride    . sodium chloride    . sodium chloride       LOS: 5 days    Time spent: 30 minutes     Darleen Crocker, DO [facility] Pager [phone]  If 7PM-7AM, please contact night-coverage [email or web address] Password [facility] 09/19/2019, 2:32 PM

## 2019-09-19 NOTE — Progress Notes (Signed)
[facility]  Blood Toxicology not back this AM. Called the lab and they said it could be up to 5 days before the Toxicology would result.  No urine is being made.  We are seeing what Anesthesia thinks about the cocaine positive.  I do not want to do a tunneled catheter under local only.  If unable to do sedation today, then will have to replace femoral with temporary IJ and plan for tunneled IJ at a later date.   Have updated Dr. Manuella Ghazi.  Curlene Labrum, MD [facility] [address] [place], [place] [address] [phone] (office)

## 2019-09-19 NOTE — Progress Notes (Signed)
Patient ID: Alicia Flowers, female   DOB: 1971-05-12, 48 y.o.   MRN: [id number]   S: Arm still painful and swollen. HD yesterdya, 1L UF uneventrul; reports very little uOP, not quantified; remains on high dose steroids IV  O:BP (!) 148/91 (BP Location: Right Arm)   Pulse 77   Temp 98.9 F (37.2 C) (Oral)   Resp 16   Ht 5\' 3"  (1.6 m)   Wt 84.1 kg   LMP 09/07/2019   SpO2 100%   BMI 32.84 kg/m   Intake/Output Summary (Last 24 hours) at 09/19/2019 0933 Last data filed at 09/18/2019 1955 Gross per 24 hour  Intake 240 ml  Output 1200 ml  Net -960 ml   Intake/Output: I/O last 3 completed shifts: In: 240 [P.O.:240] Out: 1200 [Other:1200]  Intake/Output this shift:  No intake/output data recorded. Weight change:  Gen: NAD, lying in bed HEENT: EOMI PERRL CVS: RRR no m/r/g Resp: cta with decreased BS at bases Abd: +BS, soft, NT Ext: +2 edema in LUE, un-tunneled HD catheter in right femoral area  Recent Labs  Lab 09/14/19 0819 09/14/19 1210 09/14/19 1727 09/15/19 0545 09/16/19 0729 09/17/19 0646 09/18/19 0621 09/19/19 0502  NA 136 134* 135 134* 138 134* 132* 130*  K 6.6* 6.9* 6.5* 6.0* 4.9 3.9 4.4 4.0  CL 101 102 101 98 101 99 97* 96*  CO2 18* 17* 19* 22 24 22 22 23   GLUCOSE 163* 145* 162* 126* 94 137* 121* 132*  BUN 48* 51* 56* 65* 55* 53* 79* 69*  CREATININE 4.77* 4.70* 4.97* 5.22* 4.82* 4.77* 6.29* 5.79*  ALBUMIN 3.7 3.5 3.3* 2.9* 2.4* 2.5* 2.8* 2.5*  CALCIUM 6.5* 6.5* 6.1* 5.6* 6.4* 7.2* 7.1* 7.0*  PHOS  --  10.5*  --   --   --   --  7.6* 5.6*  AST 2,087* 2,046* 1,780* 1,187* 576* 431*  --   --   ALT 932* 959* 898* 709* 475* 393*  --   --    Liver Function Tests: Recent Labs  Lab 09/15/19 0545 09/16/19 0729 09/17/19 0646 09/18/19 0621 09/19/19 0502  AST 1,187* 576* 431*  --   --   ALT 709* 475* 393*  --   --   ALKPHOS 115 98 84  --   --   BILITOT 0.3 0.7 0.5  --   --   PROT 6.2* 5.6* 5.7*  --   --   ALBUMIN 2.9* 2.4* 2.5* 2.8* 2.5*   Recent Labs  Lab  09/14/19 1153  LIPASE 27   Recent Labs  Lab 09/14/19 1153  AMMONIA 27   CBC: Recent Labs  Lab 09/14/19 0819 09/14/19 1210 09/15/19 0545 09/17/19 0042 09/18/19 0621 09/19/19 0502  WBC 14.4* 13.5* 13.5* 14.8* 13.9* 12.0*  NEUTROABS 12.2*  --   --  13.8*  --   --   HGB 15.7* 15.3* 13.7 11.7* 10.9* 9.9*  HCT 50.9* 49.1* 40.5 34.6* 31.8* 28.3*  MCV 98.8 99.0 91.4 91.1 90.3 87.9  PLT 375 329 267 176 177 147*   Cardiac Enzymes: Recent Labs  Lab 09/14/19 1727 09/16/19 1135 09/17/19 0646 09/18/19 0621  CKTOTAL >50,000* 45,186* 24,838* 22,451*   CBG: Recent Labs  Lab 09/17/19 2130 09/18/19 0725 09/18/19 1155 09/18/19 1654 09/19/19 0722  GLUCAP 147* 122* 108* 117* 136*    Iron Studies: No results for input(s): IRON, TIBC, TRANSFERRIN, FERRITIN in the last 72 hours. Studies/Results: US Venous Img Upper Uni Left  Result Date: 09/17/2019 CLINICAL DATA:  Edema,  pain EXAM: LEFT UPPER EXTREMITY VENOUS DOPPLER ULTRASOUND TECHNIQUE: Gray-scale sonography with graded compression, as well as color Doppler and duplex ultrasound were performed to evaluate the upper extremity deep venous system from the level of the subclavian vein and including the jugular, axillary, basilic, radial, ulnar and upper cephalic vein. Spectral Doppler was utilized to evaluate flow at rest and with distal augmentation maneuvers. COMPARISON:  None. FINDINGS: Contralateral Subclavian Vein: Respiratory phasicity is normal and symmetric with the symptomatic side. No evidence of thrombus. Normal compressibility. Internal Jugular Vein: No evidence of thrombus. Normal compressibility, respiratory phasicity and response to augmentation. Subclavian Vein: No evidence of thrombus. Normal compressibility, respiratory phasicity and response to augmentation. Axillary Vein: No evidence of thrombus. Normal compressibility, respiratory phasicity and response to augmentation. Cephalic Vein: No evidence of thrombus. Normal  compressibility, respiratory phasicity and response to augmentation. Basilic Vein: No evidence of thrombus. Normal compressibility, respiratory phasicity and response to augmentation. Brachial Veins: No evidence of thrombus. Normal compressibility, respiratory phasicity and response to augmentation. Radial Veins: No evidence of thrombus. Normal compressibility, respiratory phasicity and response to augmentation. Ulnar Veins: No evidence of thrombus. Normal compressibility, respiratory phasicity and response to augmentation. Venous Reflux:  None visualized. Other Findings:  Subcutaneous edema noted in the forearm. IMPRESSION: No evidence of DVT within the left upper extremity. Electronically Signed   By: Lucrezia Europe M.D.   On: 09/17/2019 11:27   . aspirin EC  81 mg Oral Daily  . atorvastatin  20 mg Oral q1800  . Chlorhexidine Gluconate Cloth  6 each Topical Once   And  . Chlorhexidine Gluconate Cloth  6 each Topical Once  . Chlorhexidine Gluconate Cloth  6 each Topical Q0600  . Chlorhexidine Gluconate Cloth  6 each Topical Q0600  . Chlorhexidine Gluconate Cloth  6 each Topical Once   And  . Chlorhexidine Gluconate Cloth  6 each Topical Once  . folic acid  1 mg Oral Daily  . heparin injection (subcutaneous)  5,000 Units Subcutaneous Q8H  . insulin aspart  0-5 Units Subcutaneous QHS  . insulin aspart  0-9 Units Subcutaneous TID WC  . levothyroxine  50 mcg Oral Q0600  . multivitamin with minerals  1 tablet Oral Daily  . nicotine  14 mg Transdermal Daily  . pantoprazole  40 mg Oral Daily  . sodium chloride flush  3 mL Intravenous Q12H  . thiamine  100 mg Oral Daily   Or  . thiamine  100 mg Intravenous Daily    BMET    Component Value Date/Time   NA 130 (L) 09/19/2019 0502   K 4.0 09/19/2019 0502   CL 96 (L) 09/19/2019 0502   CO2 23 09/19/2019 0502   GLUCOSE 132 (H) 09/19/2019 0502   BUN 69 (H) 09/19/2019 0502   CREATININE 5.79 (H) 09/19/2019 0502   CALCIUM 7.0 (L) 09/19/2019 0502    GFRNONAA 8 (L) 09/19/2019 0502   GFRAA 9 (L) 09/19/2019 0502   CBC    Component Value Date/Time   WBC 12.0 (H) 09/19/2019 0502   RBC 3.22 (L) 09/19/2019 0502   HGB 9.9 (L) 09/19/2019 0502   HCT 28.3 (L) 09/19/2019 0502   PLT 147 (L) 09/19/2019 0502   MCV 87.9 09/19/2019 0502   MCH 30.7 09/19/2019 0502   MCHC 35.0 09/19/2019 0502   RDW 14.0 09/19/2019 0502   LYMPHSABS 0.6 (L) 09/17/2019 0042   MONOABS 0.4 09/17/2019 0042   EOSABS 0.0 09/17/2019 0042   BASOSABS 0.0 09/17/2019 0042  Assessment: 1. Anuric AKI- in setting of rhabdomyolysis presumably due to cocaine and alcohol use.  No history of loss of consciousness but unclear.  Renal US without obstruction.  Acute GN workup w/o sig positive findings.  ANA +, complements WNL, antiDNA antibody negative, ASO titer, ANCA, and anti GBM normal.   2. Rhabdomyolysis- presumably from cocaine/alcohol and possible LOC.  Follow CK levels. Remain elevated  3. 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 CAP/sepsis- on antibiotics per primary svc 5. Hyperkalemia-   resolved  6. Left arm weakness- possibly related to right cva vs rhabdo, neuro to evaluate 7. Polysubstance abuse- SW consulted.  Will likely need withdrawal protocol as she admits to drinking daily 8. Abnormal EKG with new partial RBBB- per [facility]  Likely cocaine related.   Plan:  HD tomorrow.  4h, 2L UF, Start 2K, No heparin.   For [facility] today Look into CLIP as AKI as outpt

## 2019-09-20 ENCOUNTER — Encounter (HOSPITAL_COMMUNITY)
Admission: EM | Disposition: A | Discharge status: Home Health | Payer: Self-pay | Source: Home / Self Care | Attending: Internal Medicine

## 2019-09-20 LAB — CBC
HCT: 31.6 % — ABNORMAL LOW (ref 36.0–46.0)
Hemoglobin: 10.6 g/dL — ABNORMAL LOW (ref 12.0–15.0)
MCH: 30.2 pg (ref 26.0–34.0)
MCHC: 33.5 g/dL (ref 30.0–36.0)
MCV: 90 fL (ref 80.0–100.0)
Platelets: 160 10*3/uL (ref 150–400)
RBC: 3.51 MIL/uL — ABNORMAL LOW (ref 3.87–5.11)
RDW: 14.7 % (ref 11.5–15.5)
WBC: 14.3 10*3/uL — ABNORMAL HIGH (ref 4.0–10.5)
nRBC: 0.1 % (ref 0.0–0.2)

## 2019-09-20 LAB — COMPREHENSIVE METABOLIC PANEL
ALT: 266 U/L — ABNORMAL HIGH (ref 0–44)
AST: 147 U/L — ABNORMAL HIGH (ref 15–41)
Albumin: 2.8 g/dL — ABNORMAL LOW (ref 3.5–5.0)
Alkaline Phosphatase: 66 U/L (ref 38–126)
Anion gap: 15 (ref 5–15)
BUN: 94 mg/dL — ABNORMAL HIGH (ref 6–20)
CO2: 21 mmol/L — ABNORMAL LOW (ref 22–32)
Calcium: 7.4 mg/dL — ABNORMAL LOW (ref 8.9–10.3)
Chloride: 95 mmol/L — ABNORMAL LOW (ref 98–111)
Creatinine, Ser: 7.31 mg/dL — ABNORMAL HIGH (ref 0.44–1.00)
GFR calc Af Amer: 7 mL/min — ABNORMAL LOW (ref >60)
GFR calc non Af Amer: 6 mL/min — ABNORMAL LOW (ref >60)
Glucose, Bld: 136 mg/dL — ABNORMAL HIGH (ref 70–99)
Potassium: 4 mmol/L (ref 3.5–5.1)
Sodium: 131 mmol/L — ABNORMAL LOW (ref 135–145)
Total Bilirubin: 0.8 mg/dL (ref 0.3–1.2)
Total Protein: 5.5 g/dL — ABNORMAL LOW (ref 6.5–8.1)

## 2019-09-20 LAB — GLUCOSE, CAPILLARY
Glucose-Capillary: 117 mg/dL — ABNORMAL HIGH (ref 70–99)
Glucose-Capillary: 128 mg/dL — ABNORMAL HIGH (ref 70–99)
Glucose-Capillary: 101 mg/dL — ABNORMAL HIGH (ref 70–99)
Glucose-Capillary: 162 mg/dL — ABNORMAL HIGH (ref 70–99)

## 2019-09-20 LAB — CK: Total CK: 10175 U/L — ABNORMAL HIGH (ref 38–234)

## 2019-09-20 LAB — PROTIME-INR
INR: 1.1 (ref 0.8–1.2)
Prothrombin Time: 14.4 seconds (ref 11.4–15.2)

## 2019-09-20 LAB — RAPID URINE DRUG SCREEN, HOSP PERFORMED
Amphetamines: NOT DETECTED
Barbiturates: NOT DETECTED
Benzodiazepines: NOT DETECTED
Cocaine: POSITIVE — AB
Opiates: NOT DETECTED
Tetrahydrocannabinol: POSITIVE — AB

## 2019-09-20 SURGERY — INSERTION OF DIALYSIS CATHETER
Anesthesia: Choice | Laterality: Right

## 2019-09-20 MED ORDER — LIDOCAINE HCL (PF) 1 % IJ SOLN
INTRAMUSCULAR | Status: AC
  Filled 2019-09-20: qty 30

## 2019-09-20 MED ORDER — AMLODIPINE BESYLATE 5 MG PO TABS: 5.0000 mg | ORAL_TABLET | Freq: Every day | ORAL | Status: DC

## 2019-09-20 MED ORDER — HEPARIN SODIUM (PORCINE) 1000 UNIT/ML IJ SOLN
INTRAMUSCULAR | Status: AC
  Filled 2019-09-20: qty 4

## 2019-09-20 MED ORDER — AMLODIPINE BESYLATE 5 MG PO TABS
10.0000 mg | ORAL_TABLET | Freq: Every day | ORAL | Status: DC
  Administered 2019-09-22 – 2019-09-28 (×7): 10 mg via ORAL
  Filled 2019-09-20 (×7): qty 2

## 2019-09-20 MED ORDER — CEFAZOLIN SODIUM-DEXTROSE 2-4 GM/100ML-% IV SOLN: 2.0000 g | INTRAVENOUS | Status: AC

## 2019-09-20 NOTE — Progress Notes (Signed)
PROGRESS NOTE    Alicia Flowers  [id number] DOB: [birth year] DOA: 09/14/2019 PCP: Patient, No Pcp Per   Brief Narrative:  Per HPI: 48 y.o.femalewith a medical history significant for tobacco abuse, alcohol abuse, cocaine abuse and marijuana use;who presented to the hospital secondary to worsening numbness in her left upper extremity and left leg. Patient reports his symptoms have been present for approximately 3 months but worsening in the last 2 to 3 days prior to admission. Patient also expressed feeling slightly more tired and having decrease oral intake.No chest pain, no nausea, no vomiting, no productive cough, no sick contacts. Patient reports to continue smoking about half pack per day and is drinking on daily basis. Last use of marijuana was the night prior to admission.  In the ED patient was found to be mildly hypoxic, with elevated WBCs, acute renal failure with a creatinine close to 5, hyperkalemic, hypophosphatemic with a normal radiologic studies suggesting community-acquired pneumonia. She also has a positive MRI demonstrating right occipital stroke. Cultures were taken, sodium bicarbonate, insulin and calcium gluconate provided for hyperkalemia.Patient is started on IV antibiotics and [facility] has been contacted to admit patient for further evaluation and management.  9/22: Patient continues to have left arm swelling and pain.  And was due to have tunneled catheter placement for hemodialysis tomorrow.  Unfortunately, blood toxicology results will take approximately 5 days to return and anesthesia unwilling to place patient under sedation for this procedure without appropriate toxicology.  Urine drug screen is being attempted if patient will make enough urine to check.  Appreciate general surgery who may have to place IJ in the interim for dialysis as femoral line cannot stay in place for longer.  9/23: Patient continues to have ongoing left arm swelling and pain, but CK  levels are downtrending.  Plans are for hemodialysis today and blood pressures are elevated, therefore amlodipine added by nephrology.  Plans to remove femoral line catheter after hemodialysis today with placement of tunneled catheter in a.m. by IR at [facility] due to ongoing cocaine positivity and urine drug screen.   Assessment & Plan:   Principal Problem:   CAP (community acquired pneumonia) Active Problems:   Acute renal failure (ARF) (HCC)   Hypoxia   Alcohol abuse   Cocaine abuse (HCC)   Hyperkalemia   Metabolic acidosis   Hyperphosphatemia   Elevated troponin   Oliguria and anuria   1-hypoxia/community-acquired pneumonia- resolved -Afebrile -WBCs were slowly trending down; anticipated confusion information after initiation of steroids as recommended by neurology service. -Reports no chest pain -Continue flutter valve, good oxygen saturation on room air currently.  -Completed 5-day course of antibiotics with Rocephin  2-acute renal failure-anuric in setting of rhabdomyolysis -Continue avoiding nephrotoxic agents -Creatinine down after HD; still without urine output.  -Femoral catheter in place, tolerated first HD tx on 9/18; second treatment provided on (9/19); plans for hemodialysis today 9/23 with subsequent removal of femoral catheter -Plans for tunneled catheter placement with IR at [facility] in a.m. -Continue supportive care. -Follow renal function trend. -Renal ultrasound without obstructive uropathy.  3-history of alcohol abuse-no active withdrawal appreciated -Continue CIWA protocol -Continue thiamine and folic acid.  4-tobacco abuse, cocaine abuse/marijuana use -Cessation counseling has been provided -Continue nicotine patch  5-right occipital stroke/concerns for myositis  -Patient presenting with left numbness affecting left side -Abnormal electrolytes and renal failure slightly contributed to left side weakness/numbness. -Follow results from  dialysis trial -Continue electrolytes repletion as needed -Followin neurology service recommendations patient  has been started on IV steroids. Outpatient and electromyography recommended. -Will continue aspirin for secondary prevention and also statins -LDL of 80 and HDL 31; triglycerides level 241. -A1c 5.3. -Normal B12 level and elevated TSH. -CK level trending down  6-elevated TSH/hypothyroidism -Started on Synthroid and will recommend repeat thyroid profile in 4-6 weeks.  7-mixed hyperlipidemia -Hold Lipitor for now given rhabdomyolysis/elevated LFTs -Rhabdomyolysis improving  8-elevated troponin -No chest pain -No acute ischemic changes appreciated on telemetry or EKG -Reassuring 2D echo with normal ejection fraction and no wall motion abnormalities. -Patient's elevated troponin most likely associated with ongoing rhabdomyolysis and renal failure. -Continue statins and aspirin. -no further acute inpatient cardiac work up.  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 -Alcohol cessation counseling has been provided. -Trend with repeat CBC in a.m.  10- rhabdomyolysis likely secondary to cocaine use/alcohol use versus necrotizing myopathy -Repeat toxicology pending -Plans for tunnel catheter placement in a.m. -IV Solu-Medrol decreased to oral prednisone 100 mg daily which will need to be ongoing for several months according to neurology  11-polysubstance abuse -Repeat urine toxicology positive for cocaine and marijuana   DVT prophylaxis:heparin  Code Status:Full code. Family Communication:No family at bedside. Disposition Plan: Continue close monitoring.  Plans for hemodialysis today through femoral line and then subsequent removal.  Placement of tunneled catheter via IR in a.m.   Continue prednisone.  Consultants:  Nephrology service  Neurology  Cardiology service has been curbside (Dr. Bronson Ing)  General Surgery  Procedures:  See below for x-ray reports.  Antimicrobials:  Anti-infectives (From admission, onward)   Start     Dose/Rate Route Frequency Ordered Stop   09/21/19 0000  ceFAZolin (ANCEF) IVPB 2g/100 mL premix     2 g 200 mL/hr over 30 Minutes Intravenous To Radiology 09/20/19 1050 09/22/19 0000   09/19/19 0830  ceFAZolin (ANCEF) IVPB 2g/100 mL premix  Status:  Discontinued     2 g 200 mL/hr over 30 Minutes Intravenous On call to O.R. 09/19/19 0816 09/19/19 1338   09/15/19 1230  cefTRIAXone (ROCEPHIN) 2 g in sodium chloride 0.9 % 100 mL IVPB     2 g 200 mL/hr over 30 Minutes Intravenous Every 24 hours 09/14/19 1228 09/19/19 1249   09/15/19 1200  azithromycin (ZITHROMAX) tablet 500 mg     500 mg Oral Daily 09/15/19 1024 09/18/19 0827   09/14/19 1230  cefTRIAXone (ROCEPHIN) 2 g in sodium chloride 0.9 % 100 mL IVPB  Status:  Discontinued     2 g 200 mL/hr over 30 Minutes Intravenous Every 24 hours 09/14/19 1216 09/14/19 1228   09/14/19 1230  azithromycin (ZITHROMAX) 500 mg in sodium chloride 0.9 % 250 mL IVPB  Status:  Discontinued     500 mg 250 mL/hr over 60 Minutes Intravenous Every 24 hours 09/14/19 1216 09/15/19 1024   09/14/19 1230  cefTRIAXone (ROCEPHIN) 1 g in sodium chloride 0.9 % 100 mL IVPB     1 g 200 mL/hr over 30 Minutes Intravenous  Once 09/14/19 1229 09/14/19 1421   09/14/19 1200  doxycycline (VIBRAMYCIN) 100 mg in sodium chloride 0.9 % 250 mL IVPB     100 mg 125 mL/hr over 120 Minutes Intravenous  Once 09/14/19 1157 09/15/19 1011   09/14/19 1100  cefTRIAXone (ROCEPHIN) 1 g in sodium chloride 0.9 % 100 mL IVPB     1 g 200 mL/hr  over 30 Minutes Intravenous  Once 09/14/19 1046 09/14/19 1206       Subjective: Patient seen and evaluated today with ongoing left arm pain noted.  She is noted to be hypertensive this  morning with plans for hemodialysis today.  Objective: Vitals:   09/19/19 1956 09/19/19 2211 09/19/19 2212 09/20/19 0547  BP:  (!) 161/104  (!) 188/105  Pulse:  80 77 71  Resp:  18  16  Temp:  98.9 F (37.2 C)  98.7 F (37.1 C)  TempSrc:  Oral  Oral  SpO2: 100% 95% 100% 100%  Weight:      Height:        Intake/Output Summary (Last 24 hours) at 09/20/2019 1253 Last data filed at 09/20/2019 0900 Gross per 24 hour  Intake 240 ml  Output --  Net 240 ml   Filed Weights   09/16/19 1010 09/16/19 1319 09/18/19 1645  Weight: 83 kg 82 kg 84.1 kg    Examination:  General exam: Appears calm and comfortable, somewhat somnolent Respiratory system: Clear to auscultation. Respiratory effort normal. Cardiovascular system: S1 & S2 heard, RRR. No JVD, murmurs, rubs, gallops or clicks. No pedal edema. Gastrointestinal system: Abdomen is nondistended, soft and nontender. No organomegaly or masses felt. Normal bowel sounds heard. Central nervous system: Alert and oriented. No focal neurological deficits. Extremities: Symmetric 5 x 5 power.  Left arm swelling with tenderness and pain noted. Skin: No rashes, lesions or ulcers Psychiatry: Judgement and insight appear normal. Mood & affect appropriate.     Data Reviewed: I have personally reviewed following labs and imaging studies  CBC: Recent Labs  Lab 09/14/19 0819  09/15/19 0545 09/17/19 0042 09/18/19 0621 09/19/19 0502 09/20/19 0414  WBC 14.4*   < > 13.5* 14.8* 13.9* 12.0* 14.3*  NEUTROABS 12.2*  --   --  13.8*  --   --   --   HGB 15.7*   < > 13.7 11.7* 10.9* 9.9* 10.6*  HCT 50.9*   < > 40.5 34.6* 31.8* 28.3* 31.6*  MCV 98.8   < > 91.4 91.1 90.3 87.9 90.0  PLT 375   < > 267 176 177 147* 160   < > = values in this interval not displayed.   Basic Metabolic Panel: Recent Labs  Lab 09/14/19 1153 09/14/19 1210  09/16/19 0729 09/17/19 0646 09/18/19 0621 09/19/19 0502 09/20/19 0414  NA  --  134*   < > 138 134* 132* 130* 131*   K  --  6.9*   < > 4.9 3.9 4.4 4.0 4.0  CL  --  102   < > 101 99 97* 96* 95*  CO2  --  17*   < > 24 22 22 23  21*  GLUCOSE  --  145*   < > 94 137* 121* 132* 136*  BUN  --  51*   < > 55* 53* 79* 69* 94*  CREATININE  --  4.70*   < > 4.82* 4.77* 6.29* 5.79* 7.31*  CALCIUM  --  6.5*   < > 6.4* 7.2* 7.1* 7.0* 7.4*  MG 2.5* 2.7*  --   --   --   --   --   --   PHOS  --  10.5*  --   --   --  7.6* 5.6*  --    < > = values in this interval not displayed.   GFR: Estimated Creatinine Clearance: 9.8 mL/min (A) (by C-G formula based on SCr of 7.31 mg/dL (  H)). Liver Function Tests: Recent Labs  Lab 09/14/19 1727 09/15/19 0545 09/16/19 0729 09/17/19 0646 09/18/19 0621 09/19/19 0502 09/20/19 0414  AST 1,780* 1,187* 576* 431*  --   --  147*  ALT 898* 709* 475* 393*  --   --  266*  ALKPHOS 131* 115 98 84  --   --  66  BILITOT 0.3 0.3 0.7 0.5  --   --  0.8  PROT 6.9 6.2* 5.6* 5.7*  --   --  5.5*  ALBUMIN 3.3* 2.9* 2.4* 2.5* 2.8* 2.5* 2.8*   Recent Labs  Lab 09/14/19 1153  LIPASE 27   Recent Labs  Lab 09/14/19 1153  AMMONIA 27   Coagulation Profile: Recent Labs  Lab 09/14/19 0819 09/20/19 0954  INR 1.1 1.1   Cardiac Enzymes: Recent Labs  Lab 09/16/19 1135 09/17/19 0646 09/18/19 0621 09/19/19 0502 09/20/19 0414  CKTOTAL 45,186* 24,838* 22,451* 12,683* 10,175*   BNP (last 3 results) No results for input(s): PROBNP in the last 8760 hours. HbA1C: No results for input(s): HGBA1C in the last 72 hours. CBG: Recent Labs  Lab 09/19/19 1111 09/19/19 1614 09/19/19 2213 09/20/19 0714 09/20/19 1102  GLUCAP 120* 135* 141* 117* 128*   Lipid Profile: No results for input(s): CHOL, HDL, LDLCALC, TRIG, CHOLHDL, LDLDIRECT in the last 72 hours. Thyroid Function Tests: No results for input(s): TSH, T4TOTAL, FREET4, T3FREE, THYROIDAB in the last 72 hours. Anemia Panel: No results for input(s): VITAMINB12, FOLATE, FERRITIN, TIBC, IRON, RETICCTPCT in the last 72 hours. Sepsis  Labs: Recent Labs  Lab 09/14/19 1154  LATICACIDVEN 2.4*    Recent Results (from the past 240 hour(s))  Urine culture     Status: Abnormal   Collection Time: 09/14/19  8:41 AM   Specimen: Urine, Random  Result Value Ref Range Status   Specimen Description   Final    URINE, RANDOM Performed at [facility], [address] [place], [place] [address]    Special Requests   Final    NONE Performed at [facility], [address], [place], [place] [address]    Culture (A)  Final    30,000 COLONIES/mL GROUP B STREP(S.AGALACTIAE)ISOLATED TESTING AGAINST S. AGALACTIAE NOT ROUTINELY PERFORMED DUE TO PREDICTABILITY OF AMP/PEN/VAN SUSCEPTIBILITY. Performed at [facility], [place] [address], [place], [place] [address]    Report Status 09/16/2019 FINAL  Final  SARS Coronavirus 2 [facility] order, Performed in [facility] hospital lab) Nasopharyngeal Nasopharyngeal Swab     Status: None   Collection Time: 09/14/19 10:04 AM   Specimen: Nasopharyngeal Swab  Result Value Ref Range Status   SARS Coronavirus 2 NEGATIVE NEGATIVE Final    Comment: (NOTE) If result is NEGATIVE SARS-CoV-2 target nucleic acids are NOT DETECTED. The SARS-CoV-2 RNA is generally detectable in upper and lower  respiratory specimens during the acute phase of infection. The lowest  concentration of SARS-CoV-2 viral copies this assay can detect is 250  copies / mL. A negative result does not preclude SARS-CoV-2 infection  and should not be used as the sole basis for treatment or other  patient management decisions.  A negative result may occur with  improper specimen collection / handling, submission of specimen other  than nasopharyngeal swab, presence of viral mutation(s) within the  areas targeted by this assay, and inadequate number of viral copies  (<250 copies / mL). A negative result must be combined with clinical  observations, patient history, and epidemiological information. If result is POSITIVE SARS-CoV-2  target nucleic  acids are DETECTED. The SARS-CoV-2 RNA is generally detectable in upper and lower  respiratory specimens dur ing the acute phase of infection.  Positive  results are indicative of active infection with SARS-CoV-2.  Clinical  correlation with patient history and other diagnostic information is  necessary to determine patient infection status.  Positive results do  not rule out bacterial infection or co-infection with other viruses. If result is PRESUMPTIVE POSTIVE SARS-CoV-2 nucleic acids MAY BE PRESENT.   A presumptive positive result was obtained on the submitted specimen  and confirmed on repeat testing.  While 2019 novel coronavirus  (SARS-CoV-2) nucleic acids may be present in the submitted sample  additional confirmatory testing may be necessary for epidemiological  and / or clinical management purposes  to differentiate between  SARS-CoV-2 and other Sarbecovirus currently known to infect humans.  If clinically indicated additional testing with an alternate test  methodology [phone]) is advised. The SARS-CoV-2 RNA is generally  detectable in upper and lower respiratory sp ecimens during the acute  phase of infection. The expected result is Negative. Fact Sheet for Patients:  StrictlyIdeas.no Fact Sheet for Healthcare Providers: BankingDealers.co.za This test is not yet approved or cleared by the [place] FDA and has been authorized for detection and/or diagnosis of SARS-CoV-2 by FDA under an Emergency Use Authorization (EUA).  This EUA will remain in effect (meaning this test can be used) for the duration of the COVID-19 declaration under Section 564(b)(1) of the Act, 21 U.S.C. section 360bbb-3(b)(1), unless the authorization is terminated or revoked sooner. Performed at [facility], [address], [place], [place] [address]   MRSA PCR Screening     Status: None   Collection Time: 09/15/19  8:45 AM    Specimen: Nasal Mucosa; Nasopharyngeal  Result Value Ref Range Status   MRSA by PCR NEGATIVE NEGATIVE Final    Comment:        The GeneXpert MRSA Assay (FDA approved for NASAL specimens only), is one component of a comprehensive MRSA colonization surveillance program. It is not intended to diagnose MRSA infection nor to guide or monitor treatment for MRSA infections. Performed at [facility], [address] [place],  [address]          Radiology Studies: No results found.      Scheduled Meds:  amLODipine  10 mg Oral Daily   aspirin EC  81 mg Oral Daily   atorvastatin  20 mg Oral q1800   Chlorhexidine Gluconate Cloth  6 each Topical Once   And   Chlorhexidine Gluconate Cloth  6 each Topical Once   Chlorhexidine Gluconate Cloth  6 each Topical Q0600   Chlorhexidine Gluconate Cloth  6 each Topical [id number]   folic acid  1 mg Oral Daily   heparin injection (subcutaneous)  5,000 Units Subcutaneous Q8H   insulin aspart  0-5 Units Subcutaneous QHS   insulin aspart  0-9 Units Subcutaneous TID WC   levothyroxine  50 mcg Oral Q0600   multivitamin with minerals  1 tablet Oral Daily   nicotine  14 mg Transdermal Daily   pantoprazole  40 mg Oral Daily   predniSONE  100 mg Oral Q breakfast   sodium chloride flush  3 mL Intravenous Q12H   thiamine  100 mg Oral Daily   Or   thiamine  100 mg Intravenous Daily   Continuous Infusions:  sodium chloride     sodium chloride     sodium chloride     [START ON 09/21/2019]  ceFAZolin (ANCEF) IV       LOS: 6 days    Time spent: 30 minutes    Dasha Kawabata Darleen Crocker, DO [facility] Pager [phone]  If 7PM-7AM, please contact night-coverage [email or web address] Password [facility] 09/20/2019, 12:53 PM

## 2019-09-20 NOTE — Progress Notes (Signed)
Patient ID: Alicia Flowers, female   DOB: 1971-11-11, 48 y.o.   MRN: [id number]   S:   Arm still hurts quite a bit, swollen, distal pulses intact, hand is warm  Unable to receive new tunneled HD catheter yesterday  Did make urine overnight, urine toxicology positive for cocaine  It appears we will not be able to CLIP to HD unit as AKI.  Blood pressures are elevated, weights are up 15 to 20 pounds.  Now 100 mg of prednisone  CK improved to 10,000  O:BP (!) 188/105 (BP Location: Right Arm)   Pulse 71   Temp 98.7 F (37.1 C) (Oral)   Resp 16   Ht 5\' 3"  (1.6 m)   Wt 84.1 kg   LMP 09/07/2019   SpO2 100%   BMI 32.84 kg/m   Intake/Output Summary (Last 24 hours) at 09/20/2019 0914 Last data filed at 09/20/2019 0900 Gross per 24 hour  Intake 240 ml  Output 300 ml  Net -60 ml   Intake/Output: I/O last 3 completed shifts: In: 240 [P.O.:240] Out: 1500 [Urine:300; Other:1200]  Intake/Output this shift:  No intake/output data recorded. Weight change:  Gen: NAD, lying in bed HEENT: EOMI PERRL CVS: RRR no m/r/g Resp: cta with decreased BS at bases Abd: +BS, soft, NT Ext: +2 edema in LUE, un-tunneled HD catheter in right femoral area  Recent Labs  Lab 09/14/19 0819 09/14/19 1210 09/14/19 1727 09/15/19 0545 09/16/19 0729 09/17/19 0646 09/18/19 0621 09/19/19 0502 09/20/19 0414  NA 136 134* 135 134* 138 134* 132* 130* 131*  K 6.6* 6.9* 6.5* 6.0* 4.9 3.9 4.4 4.0 4.0  CL 101 102 101 98 101 99 97* 96* 95*  CO2 18* 17* 19* 22 24 22 22 23  21*  GLUCOSE 163* 145* 162* 126* 94 137* 121* 132* 136*  BUN 48* 51* 56* 65* 55* 53* 79* 69* 94*  CREATININE 4.77* 4.70* 4.97* 5.22* 4.82* 4.77* 6.29* 5.79* 7.31*  ALBUMIN 3.7 3.5 3.3* 2.9* 2.4* 2.5* 2.8* 2.5* 2.8*  CALCIUM 6.5* 6.5* 6.1* 5.6* 6.4* 7.2* 7.1* 7.0* 7.4*  PHOS  --  10.5*  --   --   --   --  7.6* 5.6*  --   AST 2,087* 2,046* 1,780* 1,187* 576* 431*  --   --  147*  ALT 932* 959* 898* 709* 475* 393*  --   --  266*   Liver  Function Tests: Recent Labs  Lab 09/16/19 0729 09/17/19 0646 09/18/19 0621 09/19/19 0502 09/20/19 0414  AST 576* 431*  --   --  147*  ALT 475* 393*  --   --  266*  ALKPHOS 98 84  --   --  66  BILITOT 0.7 0.5  --   --  0.8  PROT 5.6* 5.7*  --   --  5.5*  ALBUMIN 2.4* 2.5* 2.8* 2.5* 2.8*   Recent Labs  Lab 09/14/19 1153  LIPASE 27   Recent Labs  Lab 09/14/19 1153  AMMONIA 27   CBC: Recent Labs  Lab 09/14/19 0819  09/15/19 0545 09/17/19 0042 09/18/19 0621 09/19/19 0502 09/20/19 0414  WBC 14.4*   < > 13.5* 14.8* 13.9* 12.0* 14.3*  NEUTROABS 12.2*  --   --  13.8*  --   --   --   HGB 15.7*   < > 13.7 11.7* 10.9* 9.9* 10.6*  HCT 50.9*   < > 40.5 34.6* 31.8* 28.3* 31.6*  MCV 98.8   < > 91.4 91.1 90.3 87.9 90.0  PLT 375   < > 267 176 177 147* 160   < > = values in this interval not displayed.   Cardiac Enzymes: Recent Labs  Lab 09/16/19 1135 09/17/19 0646 09/18/19 0621 09/19/19 0502 09/20/19 0414  CKTOTAL 45,186* [id number]* 22,451* 12,683* 10,175*   CBG: Recent Labs  Lab 09/19/19 0722 09/19/19 1111 09/19/19 1614 09/19/19 2213 09/20/19 0714  GLUCAP 136* 120* 135* 141* 117*    Iron Studies: No results for input(s): IRON, TIBC, TRANSFERRIN, FERRITIN in the last 72 hours. Studies/Results: No results found. Marland Kitchen amLODipine  10 mg Oral Daily  . aspirin EC  81 mg Oral Daily  . atorvastatin  20 mg Oral q1800  . Chlorhexidine Gluconate Cloth  6 each Topical Once   And  . Chlorhexidine Gluconate Cloth  6 each Topical Once  . Chlorhexidine Gluconate Cloth  6 each Topical Q0600  . Chlorhexidine Gluconate Cloth  6 each Topical Q0600  . folic acid  1 mg Oral Daily  . heparin injection (subcutaneous)  5,000 Units Subcutaneous Q8H  . insulin aspart  0-5 Units Subcutaneous QHS  . insulin aspart  0-9 Units Subcutaneous TID WC  . levothyroxine  50 mcg Oral Q0600  . multivitamin with minerals  1 tablet Oral Daily  . nicotine  14 mg Transdermal Daily  . pantoprazole  40  mg Oral Daily  . predniSONE  100 mg Oral Q breakfast  . sodium chloride flush  3 mL Intravenous Q12H  . thiamine  100 mg Oral Daily   Or  . thiamine  100 mg Intravenous Daily    BMET    Component Value Date/Time   NA 131 (L) 09/20/2019 0414   K 4.0 09/20/2019 0414   CL 95 (L) 09/20/2019 0414   CO2 21 (L) 09/20/2019 0414   GLUCOSE 136 (H) 09/20/2019 0414   BUN 94 (H) 09/20/2019 0414   CREATININE 7.31 (H) 09/20/2019 0414   CALCIUM 7.4 (L) 09/20/2019 0414   GFRNONAA 6 (L) 09/20/2019 0414   GFRAA 7 (L) 09/20/2019 0414   CBC    Component Value Date/Time   WBC 14.3 (H) 09/20/2019 0414   RBC 3.51 (L) 09/20/2019 0414   HGB 10.6 (L) 09/20/2019 0414   HCT 31.6 (L) 09/20/2019 0414   PLT 160 09/20/2019 0414   MCV 90.0 09/20/2019 0414   MCH 30.2 09/20/2019 0414   MCHC 33.5 09/20/2019 0414   RDW 14.7 09/20/2019 0414   LYMPHSABS 0.6 (L) 09/17/2019 0042   MONOABS 0.4 09/17/2019 0042   EOSABS 0.0 09/17/2019 0042   BASOSABS 0.0 09/17/2019 0042     Assessment: 1. Anuric AKI- in setting of rhabdomyolysis presumably due to cocaine and alcohol use.  No history of loss of consciousness but unclear.  Renal US without obstruction.  Acute GN workup w/o sig positive findings.  ANA +, complements WNL, antiDNA antibody negative, ASO titer, ANCA, and anti GBM normal.   2. Rhabdomyolysis- presumably from cocaine/alcohol and possible LOC.  Follow CK levels. Remain elevated  3. Right occipital infarct- neuro 4. CAP/sepsis- on antibiotics per primary svc 5. Hyperkalemia-   resolved  6. Left arm weakness- possibly related to right cva vs rhabdo, neuro to evaluate 7. Polysubstance abuse- SW consulted.  Will likely need withdrawal protocol as she admits to drinking daily 8. Abnormal EKG with new partial RBBB- per [facility]  Likely cocaine related.  9. Progressive elevation of blood pressures, hypervolemia  Plan:  HD today.  4h, 3-4L UF, 3K, No heparin.   [place]  based upon anesthesia; might need to go to  [facility] or IR Add amlodipine for blood pressure control

## 2019-09-20 NOTE — Consult Note (Signed)
Chief Complaint: Patient was seen in consultation today for tunneled dialysis catheter placement Chief Complaint  Patient presents with   Numbness   at the request of Dr Alfonso Ellis   Supervising Physician: Sandi Mariscal  Patient Status: [facility] IP  History of Present Illness: Alicia Flowers is a 48 y.o. 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 Otherwise negative study. Etiology indeterminate. There appears to be flow in the posterior circulation main vessels. I do not see clear evidence of venous thrombosis on this noncontrast study  Was to have tunneled dialysis catheter placed yesterday in OR But with + drug screens-- cannot have anesthesia Request mad for IR to place with sedation  [facility] 9/[facility] at [facility]    Past Medical History:  Diagnosis Date   Tennis elbow     Past Surgical History:  Procedure Laterality Date   APPENDECTOMY     CENTRAL VENOUS CATHETER INSERTION N/A 09/15/2019   Procedure: MINOR INSERTION CENTRAL LINE ADULT;  Surgeon: Aviva Signs, MD;  Location: [facility];  Service: General;  Laterality: N/A;   TUBAL LIGATION      Allergies: Patient has no known allergies.  Medications: Prior to Admission medications   Medication Sig Start Date End Date Taking? Authorizing Provider  aspirin 325 MG tablet Take 325 mg by mouth 3 (three) times a week.    Yes [provider]  diphenoxylate-atropine (LOMOTIL) 2.5-0.025 MG per tablet Take 1 tablet by mouth 4 (four) times daily as needed for diarrhea or loose stools. 02/11/14  Yes Idol, Almyra Free, PA-C    naphazoline-glycerin (CLEAR EYES) 0.012-0.2 % SOLN Place 1-2 drops into both eyes every 4 (four) hours as needed. Redness   Yes [provider]  naproxen sodium (ALEVE) 220 MG tablet Take 440 mg by mouth 2 (two) times daily as needed (tennis elbow). Pain   Yes [provider]     History reviewed. No pertinent family history.  Social History   Socioeconomic History   Marital status: Single    Spouse name: Not on file   Number of children: Not on file   Years of education: Not on file   Highest education level: Not on file  Occupational History   Not on file  Social Needs   Financial resource strain: Not on file   Food insecurity    Worry: Not on file    Inability: Not on file   Transportation needs    Medical: Not on file    Non-medical: Not on file  Tobacco Use   Smoking status: Current Every Day Smoker    Packs/day: 0.50    Types: Cigarettes   Smokeless tobacco: Never Used  Substance and Sexual Activity   Alcohol use: Yes    Comment: socially   Drug use: Yes    Types: Marijuana    Comment: last used last night   Sexual activity: Yes    Birth control/protection: Surgical  Lifestyle   Physical activity    Days per week: Not on file    Minutes per session: Not on file   Stress: Not on file  Relationships  Social Herbalist on phone: Not on file    Gets together: Not on file    Attends religious service: Not on file    Active member of club or organization: Not on file    Attends meetings of clubs or organizations: Not on file    Relationship status: Not on file  Other Topics Concern   Not on file  Social History Narrative   Not on file    Review of Systems: A 12 point ROS discussed and pertinent positives are indicated in the HPI above.  All other systems are negative.  Review of Systems  Constitutional: Positive for activity change, appetite change and fatigue. Negative for fever.  Respiratory: Negative for  cough and shortness of breath.   Cardiovascular: Negative for chest pain.  Gastrointestinal: Positive for nausea.  Neurological: Positive for weakness.  Psychiatric/Behavioral: Negative for behavioral problems and confusion.    Vital Signs: BP (!) 188/105 (BP Location: Right Arm)    Pulse 71    Temp 98.7 F (37.1 C) (Oral)    Resp 16    Ht 5\' 3"  (1.6 m)    Wt 185 lb 6.5 oz (84.1 kg)    LMP 09/07/2019    SpO2 100%    BMI 32.84 kg/m   Physical Exam Vitals signs reviewed.  Cardiovascular:     Rate and Rhythm: Normal rate and regular rhythm.     Heart sounds: Normal heart sounds.  Pulmonary:     Breath sounds: Normal breath sounds.  Abdominal:     Palpations: Abdomen is soft.  Musculoskeletal: Normal range of motion.        General: Swelling present.  Skin:    General: Skin is warm and dry.  Neurological:     Mental Status: She is alert and oriented to person, place, and time.  Psychiatric:        Mood and Affect: Mood normal.        Behavior: Behavior normal.        Thought Content: Thought content normal.        Judgment: Judgment normal.     Imaging: Ct Abdomen Pelvis Wo Contrast  Result Date: 09/14/2019 CLINICAL DATA:  Abdominal distension with elevated liver enzymes EXAM: CT ABDOMEN AND PELVIS WITHOUT CONTRAST TECHNIQUE: Multidetector CT imaging of the abdomen and pelvis was performed following the standard protocol without oral or IV contrast. COMPARISON:  None. FINDINGS: Lower chest: There is airspace consolidation in both lower lobes posteriorly. There is a degree of lower lobe bronchiectatic change is well. Hepatobiliary: No focal liver lesions are evident on this noncontrast enhanced study. The gallbladder wall is not appreciably thickened. There is no biliary duct dilatation. Pancreas: There is no pancreatic mass or inflammatory focus. Spleen: No splenic lesions are evident. Adrenals/Urinary Tract: Adrenals bilaterally appear unremarkable. Kidneys bilaterally show no  evident mass or hydronephrosis on either side. There is no evident renal or ureteral calculus on either side. Urinary bladder is midline with wall thickness within normal limits for degree of distention. Stomach/Bowel: The stomach is diffusely distended with air. There is no appreciable bowel wall or mesenteric thickening. No obstructing focus evident. The terminal ileum appears unremarkable. Vascular/Lymphatic: There is no abdominal aortic aneurysm. There is aortic and iliac artery atherosclerotic calcification. There is no adenopathy evident in the abdomen or pelvis. Reproductive: Uterus is anteverted.  No pelvic mass is demonstrated. Other: Appendix absent. No periappendiceal region inflammation. No abscess or ascites is evident in the abdomen or  pelvis. There is postoperative change in the anterior abdominal wall with several clips present. Musculoskeletal: There are foci of degenerative change in the lumbar spine. 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 No sacroiliitis evident. No blastic or lytic bone lesions. No intramuscular lesions evident. IMPRESSION: 1. Airspace consolidation consistent with pneumonia in each posterior lower lobe region. There is a degree of bilateral lower lobe bronchiectasis as well. 2. Stomach distended with air. Gastric wall does not appear appreciably thickened. No small or large bowel distention evident. Question a degree of ileus from the pneumonia causing the gastric distention. 3. No evident bowel obstruction. No abscess in the abdomen or pelvis. Appendix absent. No periappendiceal region inflammatory change. 4.  Aortic and iliac artery atherosclerosis noted. 5. Osteitis condensans ilia bilaterally, a potential source for abdominal pain. Electronically Signed   By: Lowella Grip III M.D.   On: 09/14/2019 11:53   Dg Elbow 2 Views Left  Result Date: 09/15/2019 CLINICAL DATA:  Pain and swelling, stroke, unknown injury EXAM: LEFT SHOULDER - 2+ VIEW; LEFT ELBOW - 2 VIEW  COMPARISON:  None. FINDINGS: No fracture or dislocation of the left shoulder. The joint spaces are well preserved. The partially imaged left chest is unremarkable. No fracture or dislocation of the left elbow. Joint spaces are well preserved. No elbow joint effusion. Diffuse soft tissue edema about the elbow. IMPRESSION: 1. No fracture or dislocation of the left shoulder. The joint spaces are well preserved. 2. No fracture or dislocation of the left elbow. Joint spaces are well preserved. No elbow joint effusion. Diffuse soft tissue edema about the elbow. Electronically Signed   By: Eddie Candle M.D.   On: 09/15/2019 21:50   Mr Brain Wo Contrast  Result Date: 09/14/2019 CLINICAL DATA:  Numbness of the left arm over the last 3 months. EXAM: MRI HEAD WITHOUT CONTRAST TECHNIQUE: Multiplanar, multiecho pulse sequences of the brain and surrounding structures were obtained without intravenous contrast. COMPARISON:  None. 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 Otherwise, the brain does not show any old or acute small or large vessel insult. No mass lesion, hemorrhage, hydrocephalus or extra-axial collection. Vascular: Major vessels at the base of the brain show flow. This includes the posterior circulation vessels. I do not see convincing evidence venous thrombosis. There is an arachnoid granulation at the right transverse sinus sigmoid sinus junction. 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 Otherwise negative study. Etiology indeterminate. There appears to be flow in the posterior circulation main vessels. I do not see clear evidence of venous thrombosis on this noncontrast study. One could consider CT or MR venography. Electronically Signed   By: Nelson Chimes M.D.   On: 09/14/2019 10:11   Mr Cervical Spine Wo Contrast  Result Date: 09/14/2019 CLINICAL  DATA:  Left arm weakness for 3 months EXAM: MRI CERVICAL SPINE WITHOUT CONTRAST TECHNIQUE: Multiplanar, multisequence MR imaging of the cervical spine was performed. No intravenous contrast was administered. COMPARISON:  None. FINDINGS: The study is limited due to patient motion. Alignment: There is straightening of the normal cervical lordosis. Vertebrae: The vertebral body heights are well maintained. No fracture, marrow edema,or pathologic marrow infiltration. Cord: Appears to be normal in signal intensity, however limited due to patient motion. Posterior Fossa, vertebral arteries, paraspinal tissues: The visualized portion of the posterior fossa is unremarkable. Normal flow voids seen within the vertebral arteries. The paraspinal soft tissues  are unremarkable. Disc levels: C1-C2: Atlanto-axial junction is normal, without canal narrowing C2-C3: No significant spinal canal or neural foraminal narrowing C3-C4: There is a minimal disc osteophyte complex, however no significant canal or neural foraminal narrowing. C4-C5: There is a disc osteophyte complex and uncovertebral osteophytes which causes mild left and mild-to-moderate right neural foraminal narrowing. C5-C6: There is a disc osteophyte complex which causes mild bilateral neural foraminal narrowing. C6-C7: Disc osteophyte complex which causes mild right neural foraminal narrowing. C7-T1: No significant spinal canal or neural foraminal narrowing IMPRESSION: The study is limited due to patient motion. Mild lumbar spine spondylosis as described above this is most notable C4-C5 with mild-to-moderate right neural foraminal narrowing. Electronically Signed   By: Prudencio Pair M.D.   On: 09/14/2019 10:02   US Renal  Result Date: 09/14/2019 CLINICAL DATA:  Acute kidney injury. EXAM: RENAL / URINARY TRACT ULTRASOUND COMPLETE COMPARISON:  None. FINDINGS: Right Kidney: Renal measurements: 11.9 x 4.8 x 4.8 cm = volume: 149 mL. Minimally increased echogenicity of renal  parenchyma is noted. No mass or hydronephrosis visualized. Left Kidney: Renal measurements: 12.4 x 5.4 x 4.8 cm = volume: 171 mL. Minimally increased echogenicity of renal parenchyma is noted. No mass or hydronephrosis visualized. Bladder: Not visualized due to body habitus. IMPRESSION: Minimally increased echogenicity of renal parenchyma is noted bilaterally suggesting medical renal disease. No hydronephrosis or renal obstruction is noted. Bladder is not visualized. Electronically Signed   By: Marijo Conception M.D.   On: 09/14/2019 13:28   US Carotid Bilateral  Result Date: 09/14/2019 CLINICAL DATA:  48 year old female with a history of TIA EXAM: BILATERAL CAROTID DUPLEX ULTRASOUND TECHNIQUE: Pearline Cables scale imaging, color Doppler and duplex ultrasound were performed of bilateral carotid and vertebral arteries in the neck. COMPARISON:  None. FINDINGS: Criteria: Quantification of carotid stenosis is based on velocity parameters that correlate the residual internal carotid diameter with NASCET-based stenosis levels, using the diameter of the distal internal carotid lumen as the denominator for stenosis measurement. The following velocity measurements were obtained: RIGHT ICA:  Systolic 123XX123 cm/sec, Diastolic 51 cm/sec CCA:  123XX123 cm/sec SYSTOLIC ICA/CCA RATIO:  1.8 ECA:  99 cm/sec LEFT ICA:  Systolic 99 cm/sec, Diastolic 52 cm/sec CCA:  123456 cm/sec SYSTOLIC ICA/CCA RATIO:  1.5 ECA: NA Right Brachial SBP: Not acquired Left Brachial SBP: Not acquired RIGHT CAROTID ARTERY: No significant calcified disease of the right common carotid artery. Intermediate waveform maintained. Heterogeneous plaque without significant calcifications at the right carotid bifurcation. Low resistance waveform of the right ICA. No significant tortuosity. RIGHT VERTEBRAL ARTERY: Antegrade flow with low resistance waveform. LEFT CAROTID ARTERY: No significant calcified disease of the left common carotid artery. Intermediate waveform maintained.  Heterogeneous plaque at the left carotid bifurcation without significant calcifications. Low resistance waveform of the left ICA. LEFT VERTEBRAL ARTERY:  Antegrade flow with low resistance waveform. IMPRESSION: Color duplex indicates minimal heterogeneous plaque, with no hemodynamically significant stenosis by duplex criteria in the extracranial cerebrovascular circulation. Signed, Dulcy Fanny. Dellia Nims, RPVI [facility] Electronically Signed   By: Corrie Mckusick D.O.   On: 09/14/2019 15:28   US Venous Img Upper Uni Left  Result Date: 09/17/2019 CLINICAL DATA:  Edema, pain EXAM: LEFT UPPER EXTREMITY VENOUS DOPPLER ULTRASOUND TECHNIQUE: Gray-scale sonography with graded compression, as well as color Doppler and duplex ultrasound were performed to evaluate the upper extremity deep venous system from the level of the subclavian vein and including the jugular, axillary, basilic, radial, ulnar  and upper cephalic vein. Spectral Doppler was utilized to evaluate flow at rest and with distal augmentation maneuvers. COMPARISON:  None. FINDINGS: Contralateral Subclavian Vein: Respiratory phasicity is normal and symmetric with the symptomatic side. No evidence of thrombus. Normal compressibility. Internal Jugular Vein: No evidence of thrombus. Normal compressibility, respiratory phasicity and response to augmentation. Subclavian Vein: No evidence of thrombus. Normal compressibility, respiratory phasicity and response to augmentation. Axillary Vein: No evidence of thrombus. Normal compressibility, respiratory phasicity and response to augmentation. Cephalic Vein: No evidence of thrombus. Normal compressibility, respiratory phasicity and response to augmentation. Basilic Vein: No evidence of thrombus. Normal compressibility, respiratory phasicity and response to augmentation. Brachial Veins: No evidence of thrombus. Normal compressibility, respiratory phasicity and  response to augmentation. Radial Veins: No evidence of thrombus. Normal compressibility, respiratory phasicity and response to augmentation. Ulnar Veins: No evidence of thrombus. Normal compressibility, respiratory phasicity and response to augmentation. Venous Reflux:  None visualized. Other Findings:  Subcutaneous edema noted in the forearm. IMPRESSION: No evidence of DVT within the left upper extremity. Electronically Signed   By: Lucrezia Europe M.D.   On: 09/17/2019 11:27   Dg Chest Port 1 View  Result Date: 09/14/2019 CLINICAL DATA:  Hypoxia EXAM: PORTABLE CHEST 1 VIEW COMPARISON:  06/25/2013 FINDINGS: Cardiomegaly accentuated by low volumes. Diffuse interstitial prominence. No Kerley lines, air bronchogram, effusion or pneumothorax. Gaseous distention of the stomach. IMPRESSION: 1. Cardiomegaly accentuated by low volumes. 2. Interstitial coarsening, favor vascular congestion. 3. Prominent gaseous distension of the stomach. Electronically Signed   By: Monte Fantasia M.D.   On: 09/14/2019 10:00   Dg Shoulder Left  Result Date: 09/15/2019 CLINICAL DATA:  Pain and swelling, stroke, unknown injury EXAM: LEFT SHOULDER - 2+ VIEW; LEFT ELBOW - 2 VIEW COMPARISON:  None. FINDINGS: No fracture or dislocation of the left shoulder. The joint spaces are well preserved. The partially imaged left chest is unremarkable. No fracture or dislocation of the left elbow. Joint spaces are well preserved. No elbow joint effusion. Diffuse soft tissue edema about the elbow. IMPRESSION: 1. No fracture or dislocation of the left shoulder. The joint spaces are well preserved. 2. No fracture or dislocation of the left elbow. Joint spaces are well preserved. No elbow joint effusion. Diffuse soft tissue edema about the elbow. Electronically Signed   By: Eddie Candle M.D.   On: 09/15/2019 21:50    Labs:  CBC: Recent Labs    09/17/19 0042 09/18/19 0621 09/19/19 0502 09/20/19 0414  WBC 14.8* 13.9* 12.0* 14.3*  HGB 11.7* 10.9*  9.9* 10.6*  HCT 34.6* 31.8* 28.3* 31.6*  PLT 176 177 147* 160    COAGS: Recent Labs    09/14/19 0819  INR 1.1    BMP: Recent Labs    09/17/19 0646 09/18/19 0621 09/19/19 0502 09/20/19 0414  NA 134* 132* 130* 131*  K 3.9 4.4 4.0 4.0  CL 99 97* 96* 95*  CO2 22 22 23  21*  GLUCOSE 137* 121* 132* 136*  BUN 53* 79* 69* 94*  CALCIUM 7.2* 7.1* 7.0* 7.4*  CREATININE 4.77* 6.29* 5.79* 7.31*  GFRNONAA 10* 7* 8* 6*  GFRAA 12* 8* 9* 7*    LIVER FUNCTION TESTS: Recent Labs    09/15/19 0545 09/16/19 0729 09/17/19 0646 09/18/19 0621 09/19/19 0502 09/20/19 0414  BILITOT 0.3 0.7 0.5  --   --  0.8  AST 1,187* 576* 431*  --   --  147*  ALT 709* 475* 393*  --   --  266*  ALKPHOS 115 98 84  --   --  66  PROT 6.2* 5.6* 5.7*  --   --  5.5*  ALBUMIN 2.9* 2.4* 2.5* 2.8* 2.5* 2.8*    TUMOR MARKERS: No results for input(s): AFPTM, CEA, CA199, CHROMGRNA in the last 8760 hours.  Assessment and Plan:  Rhabdo - acute renal failure Temp cath in place Rt femoral site Need tunneled catheter for ongoing dialysis Cannot place in OR-- cannot use anesthesia with + drug screens Plan for IR to place tunneled HD cath 9/24 Slight leukocytosis (14 today) Afebrile Pt to be at [facility] by 9000 am 9/24-- via ambulance  Risks and benefits discussed with the patient including, but not limited to bleeding, infection, vascular injury, pneumothorax which may require chest tube placement, air embolism or even death  All of the patient's questions were answered, patient is agreeable to proceed. Consent signed and in chart.   Thank you for this interesting consult.  I greatly enjoyed meeting Honest A Toh and look forward to participating in their care.  A copy of this report was sent to the requesting provider on this date.  Electronically Signed: Lavonia Drafts, PA-C 09/20/2019, 10:33 AM   I spent a total of 20 Minutes    in face to face in clinical consultation, greater than 50% of which  was counseling/coordinating care for tunneled HD catheter placement

## 2019-09-20 NOTE — Progress Notes (Signed)
Physical Therapy Treatment Patient Details Name: Alicia Flowers MRN: [id number] DOB: [birth year] Today's Date: 09/20/2019    History of Present Illness Patient is a 48 year old female admitted 09/14/2019 with diagnosis of CAP. PMH:  tobacco abuse, alcohol abuse, cocaine abuse and marijuana use. Numbness and weakness in L arm past couple of months. CVA (+).    PT Comments    Patient demonstrates increased tolerance for taking steps without using AD, unsteady with wider base of support and decreased arm swing due to guarding, after verbal cues able to let arms swing, had to slow cadence to maintain balance and limited secondary to c/o fatigue.  Patient tolerated sitting up in chair after therapy - RN notified.  Patient will benefit from continued physical therapy in hospital and recommended venue below to increase strength, balance, endurance for safe ADLs and gait.    Follow Up Recommendations  Home health PT;Outpatient PT;Supervision for mobility/OOB;Supervision - Intermittent     Equipment Recommendations  Rolling walker with 5" wheels    Recommendations for Other Services       Precautions / Restrictions Precautions Precautions: Fall Restrictions Weight Bearing Restrictions: No    Mobility  Bed Mobility Overal bed mobility: Modified Independent             General bed mobility comments: slightly increased time, head of bed flat  Transfers Overall transfer level: Needs assistance Equipment used: None;1 person hand held assist Transfers: Sit to/from Stand;Stand Pivot Transfers Sit to Stand: Min guard;Min assist Stand pivot transfers: Min guard;Min assist          Ambulation/Gait Ambulation/Gait assistance: Min guard;Min assist Gait Distance (Feet): 45 Feet Assistive device: None Gait Pattern/deviations: Decreased step length - right;Decreased step length - left;Decreased stride length Gait velocity: decreased   General Gait Details: unsteady slow labored  cadence with decreased armswing, no loss of balance, limited secondary to fatigue   Stairs             Wheelchair Mobility    Modified Rankin (Stroke Patients Only)       Balance Overall balance assessment: Needs assistance Sitting-balance support: Feet supported;No upper extremity supported Sitting balance-Leahy Scale: Good     Standing balance support: During functional activity;No upper extremity supported Standing balance-Leahy Scale: Fair Standing balance comment: without AD                            Cognition Arousal/Alertness: Awake/alert Behavior During Therapy: WFL for tasks assessed/performed Overall Cognitive Status: Within Functional Limits for tasks assessed                                        Exercises General Exercises - Lower Extremity Long Arc Quad: Seated;AROM;Strengthening;Both;10 reps Hip Flexion/Marching: Seated;AROM;Strengthening;Both;10 reps Toe Raises: Seated;AROM;Strengthening;Both;10 reps Heel Raises: Seated;AROM;Strengthening;Both;10 reps    General Comments        Pertinent Vitals/Pain Pain Assessment: No/denies pain    Home Living                      Prior Function            PT Goals (current goals can now be found in the care plan section) Acute Rehab PT Goals Patient Stated Goal: return home with family to assist PT Goal Formulation: With patient Time For Goal Achievement: 09/30/19 Potential to Achieve Goals:  Good Progress towards PT goals: Progressing toward goals    Frequency    Min 3X/week      PT Plan Current plan remains appropriate    Co-evaluation              AM-PAC PT "6 Clicks" Mobility   Outcome Measure  Help needed turning from your back to your side while in a flat bed without using bedrails?: None Help needed moving from lying on your back to sitting on the side of a flat bed without using bedrails?: None Help needed moving to and from a bed to  a chair (including a wheelchair)?: A Little Help needed standing up from a chair using your arms (e.g., wheelchair or bedside chair)?: A Little Help needed to walk in hospital room?: A Little Help needed climbing 3-5 steps with a railing? : A Lot 6 Click Score: 19    End of Session Equipment Utilized During Treatment: Gait belt Activity Tolerance: Patient tolerated treatment well;Patient limited by fatigue Patient left: in chair;with call bell/phone within reach Nurse Communication: Mobility status PT Visit Diagnosis: Unsteadiness on feet (R26.81);Other abnormalities of gait and mobility (R26.89);Muscle weakness (generalized) (M62.81)     Time: [id number] PT Time Calculation (min) (ACUTE ONLY): 26 min  Charges:  $Gait Training: 8-22 mins $Therapeutic Exercise: 8-22 mins                     12:06 PM, 09/20/19 [name], MPT Physical Therapist with [facility] [phone] office [phone] mobile phone

## 2019-09-20 NOTE — Procedures (Signed)
    HEMODIALYSIS TREATMENT NOTE:  4 hour heparin-free dialysis completed via right femoral vascath which is to be removed after HD today.  Pt is scheduled for tunneled catheter placement in IR tomorrow.  Goal met: 3.7 liters removed.  Ultrafiltration was interrupted x 20 minutes d/t symptomatic hypotension relieved by NS bolus.  All blood was returned.  Pt requested to eat dinner and "move around a little" after dialysis.  AC notified; vascath removal deferred to [facility].  Rockwell Alexandria, RN

## 2019-09-20 NOTE — Progress Notes (Signed)
[facility]  Urine drug remains positive? Was able to give some urine. I had spoke to Dr. Pascal Lux with IR yesterday about cocaine positive and anesthesia being unable to do procedure. He said they could proceed at [facility].   Discussed doing non tunneled now and tunneled later versus sending her down to [facility] for IR to do a single tunneled, and Dr. Manuella Ghazi and Dr. Joelyn Oms agree that 1 procedure probably better.   She has femoral in place now and getting dialyzed today. I spoke with IR and ordered the tunneled catheter. If there are any issues I can still do the Nontunneled to get the femoral line out.   Appreciate IR help in this matter.   Curlene Labrum, MD [facility] [address] [place], [place] [address] [phone] (office)

## 2019-09-21 ENCOUNTER — Encounter (HOSPITAL_COMMUNITY): Payer: Self-pay | Admitting: Interventional Radiology

## 2019-09-21 ENCOUNTER — Ambulatory Visit (HOSPITAL_COMMUNITY)
Admission: RE | Admit: 2019-09-21 | Discharge: 2019-09-21 | Disposition: A | Discharge status: Home / Self Care | Payer: Medicaid Other | Source: Ambulatory Visit | Attending: General Surgery | Admitting: General Surgery

## 2019-09-21 DIAGNOSIS — N186 End stage renal disease: Secondary | ICD-10-CM | POA: Insufficient documentation

## 2019-09-21 HISTORY — PX: IR US GUIDE VASC ACCESS RIGHT: IMG2390

## 2019-09-21 HISTORY — PX: IR FLUORO GUIDE CV LINE RIGHT: IMG2283

## 2019-09-21 LAB — COMPREHENSIVE METABOLIC PANEL
ALT: 209 U/L — ABNORMAL HIGH (ref 0–44)
AST: 85 U/L — ABNORMAL HIGH (ref 15–41)
Albumin: 2.6 g/dL — ABNORMAL LOW (ref 3.5–5.0)
Alkaline Phosphatase: 59 U/L (ref 38–126)
Anion gap: 12 (ref 5–15)
BUN: 77 mg/dL — ABNORMAL HIGH (ref 6–20)
CO2: 21 mmol/L — ABNORMAL LOW (ref 22–32)
Calcium: 7.6 mg/dL — ABNORMAL LOW (ref 8.9–10.3)
Chloride: 97 mmol/L — ABNORMAL LOW (ref 98–111)
Creatinine, Ser: 6.86 mg/dL — ABNORMAL HIGH (ref 0.44–1.00)
GFR calc Af Amer: 8 mL/min — ABNORMAL LOW (ref >60)
GFR calc non Af Amer: 7 mL/min — ABNORMAL LOW (ref >60)
Glucose, Bld: 100 mg/dL — ABNORMAL HIGH (ref 70–99)
Potassium: 3.7 mmol/L (ref 3.5–5.1)
Sodium: 130 mmol/L — ABNORMAL LOW (ref 135–145)
Total Bilirubin: 0.8 mg/dL (ref 0.3–1.2)
Total Protein: 5.3 g/dL — ABNORMAL LOW (ref 6.5–8.1)

## 2019-09-21 LAB — GLUCOSE, CAPILLARY
Glucose-Capillary: 95 mg/dL (ref 70–99)
Glucose-Capillary: 114 mg/dL — ABNORMAL HIGH (ref 70–99)
Glucose-Capillary: 113 mg/dL — ABNORMAL HIGH (ref 70–99)
Glucose-Capillary: 95 mg/dL (ref 70–99)

## 2019-09-21 LAB — CBC
HCT: 31.3 % — ABNORMAL LOW (ref 36.0–46.0)
Hemoglobin: 10.9 g/dL — ABNORMAL LOW (ref 12.0–15.0)
MCH: 30.8 pg (ref 26.0–34.0)
MCHC: 34.8 g/dL (ref 30.0–36.0)
MCV: 88.4 fL (ref 80.0–100.0)
Platelets: 180 10*3/uL (ref 150–400)
RBC: 3.54 MIL/uL — ABNORMAL LOW (ref 3.87–5.11)
RDW: 14.6 % (ref 11.5–15.5)
WBC: 18.2 10*3/uL — ABNORMAL HIGH (ref 4.0–10.5)
nRBC: 0 % (ref 0.0–0.2)

## 2019-09-21 LAB — CK: Total CK: 5115 U/L — ABNORMAL HIGH (ref 38–234)

## 2019-09-21 LAB — PROTIME-INR
INR: 1.2 (ref 0.8–1.2)
Prothrombin Time: 14.6 seconds (ref 11.4–15.2)

## 2019-09-21 MED ORDER — CEFAZOLIN SODIUM-DEXTROSE 2-4 GM/100ML-% IV SOLN
INTRAVENOUS | Status: AC | PRN
  Administered 2019-09-21: 2 g via INTRAVENOUS

## 2019-09-21 MED ORDER — LIDOCAINE HCL 1 % IJ SOLN
INTRAMUSCULAR | Status: AC | PRN
  Administered 2019-09-21: 10 mL

## 2019-09-21 MED ORDER — HEPARIN SODIUM (PORCINE) 1000 UNIT/ML IJ SOLN
INTRAMUSCULAR | Status: AC
  Administered 2019-09-21: 3.2 mL
  Filled 2019-09-21: qty 1

## 2019-09-21 MED ORDER — MIDAZOLAM HCL 2 MG/2ML IJ SOLN
INTRAMUSCULAR | Status: AC | PRN
  Administered 2019-09-21: 0.5 mg via INTRAVENOUS

## 2019-09-21 MED ORDER — LIDOCAINE HCL 1 % IJ SOLN
INTRAMUSCULAR | Status: AC
  Filled 2019-09-21: qty 20

## 2019-09-21 MED ORDER — FENTANYL CITRATE (PF) 100 MCG/2ML IJ SOLN
INTRAMUSCULAR | Status: AC | PRN
  Administered 2019-09-21: 25 ug via INTRAVENOUS

## 2019-09-21 MED ORDER — LIDOCAINE-EPINEPHRINE (PF) 1 %-1:200000 IJ SOLN
INTRAMUSCULAR | Status: AC | PRN
  Administered 2019-09-21: 7 mL

## 2019-09-21 MED ORDER — CHLORHEXIDINE GLUCONATE CLOTH 2 % EX PADS
6.0000 | MEDICATED_PAD | Freq: Every day | CUTANEOUS | Status: DC
  Administered 2019-09-22 – 2019-09-26 (×5): 6 via TOPICAL

## 2019-09-21 NOTE — Sedation Documentation (Signed)
Pascal Lux, MD aware of lads and positive UDS on 09/23

## 2019-09-21 NOTE — Progress Notes (Signed)
Right Femoral line removed by Selena Lesser, RN. Pressure held for 30 minutes. Pressure dressing in place, clean/dry/intact. Patient tolerated well with no complaints of pain or shortness of breath. All pulses are strong and palpable.

## 2019-09-21 NOTE — Sedation Documentation (Signed)
Report given to [facility] and Therapist, sports at [employer]

## 2019-09-21 NOTE — Progress Notes (Signed)
Patient not seen today, at [facility] for tunneled HD catheter.  HD yesterday with 3.7 L ultrafiltration.  Blood pressures remain elevated overnight.  Only 0.1 L urine output documented yesterday.  Labs today with potassium 3.7, bicarbonate 21.  CK improved to 5000.  Hemoglobin stable at 10.9.  Plan on dialysis again tomorrow, 4 hours, 4 L ultrafiltration, 3K bath, no heparin.

## 2019-09-21 NOTE — TOC Progression Note (Signed)
Transition of Care [facility]) - Progression Note    Patient Details  Name: Alicia Flowers MRN: [id number] Date of Birth: September 25, 1971  [facility] CM/SW Contact  Ihor Gully, LCSW Phone Number: 09/21/2019, 2:42 PM . Clinical Narrative:    Mickel Baas at [facility] indicated that they have recently started accepting AKI as a diagnosis for facility admission.  Pam in [employer] intake advised of referral being sent.  Patient agreeable to [facility]. Prefers MWF second shift. Advised that TTS may be only available shift but her preference is noted.  Referral faxed to [facility] intake.   Expected Discharge Plan: Home/Self Care Barriers to Discharge: Continued Medical Work up  Expected Discharge Plan and Services Expected Discharge Plan: Home/Self Care In-house Referral: Clinical Social Work     Living arrangements for the past 2 months: Single Family Home                                       Social Determinants of Health (SDOH) Interventions    Readmission Risk Interventions No flowsheet data found.

## 2019-09-21 NOTE — Progress Notes (Signed)
PROGRESS NOTE    Alicia Flowers  KZL:[id number] DOB: [birth year] DOA: 09/14/2019 PCP: Patient, No Pcp Per   Brief Narrative:  Per HPI: 48 y.o.femalewith a medical history significant for tobacco abuse, alcohol abuse, cocaine abuse and marijuana use;who presented to the hospital secondary to worsening numbness in her left upper extremity and left leg. Patient reports his symptoms have been present for approximately 3 months but worsening in the last 2 to 3 days prior to admission. Patient also expressed feeling slightly more tired and having decrease oral intake.No chest pain, no nausea, no vomiting, no productive cough, no sick contacts. Patient reports to continue smoking about half pack per day and is drinking on daily basis. Last use of marijuana was the night prior to admission.  In the ED patient was found to be mildly hypoxic, with elevated WBCs, acute renal failure with a creatinine close to 5, hyperkalemic, hypophosphatemic with a normal radiologic studies suggesting community-acquired pneumonia. She also has a positive MRI demonstrating right occipital stroke. Cultures were taken, sodium bicarbonate, insulin and calcium gluconate provided for hyperkalemia.Patient is started on IV antibiotics and [facility] has been contacted to admit patient for further evaluation and management.  9/22: Patient continues to have left arm swelling and pain. And was due to have tunneled catheter placement for hemodialysis tomorrow. Unfortunately, blood toxicology results will take approximately 5 days to return and anesthesia unwilling to place patient under sedation for this procedure without appropriate toxicology. Urine drug screen is being attempted if patient will make enough urine to check. Appreciate general surgery who may have to place IJ in the interim for dialysis as femoral line cannot stay in place for longer.  9/23: Patient continues to have ongoing left arm swelling and pain, but  CK levels are downtrending.  Plans are for hemodialysis today and blood pressures are elevated, therefore amlodipine added by nephrology.  Plans to remove femoral line catheter after hemodialysis today with placement of tunneled catheter in a.m. by IR at [facility] due to ongoing cocaine positivity and urine drug screen.  9/24: Patient has undergone tunneled catheter placement with no complications noted.  Plans for hemodialysis in a.m.  She continues to have elevated blood pressure readings as well as left arm swelling and pain, but CK levels are still downtrending.   Assessment & Plan:   Principal Problem:   CAP (community acquired pneumonia) Active Problems:   Acute renal failure (ARF) (HCC)   Hypoxia   Alcohol abuse   Cocaine abuse (HCC)   Hyperkalemia   Metabolic acidosis   Hyperphosphatemia   Elevated troponin   Oliguria and anuria   1-hypoxia/community-acquired pneumonia- resolved -Afebrile -WBCs were slowly trending down; anticipated confusion information after initiation of steroids as recommended by neurology service. -Reports no chest pain -Continue flutter valve, good oxygen saturation on room air currently.  -Completed 5-day course of antibiotics with Rocephin  2-acute renal failure-anuric in setting of rhabdomyolysis -Continue avoiding nephrotoxic agents -Creatinine down after HD; still without urine output.  -Femoral catheter discontinued today -Tunneled catheter placed on 9/24 -Continue supportive care. -Follow renal function trend. -Renal ultrasound without obstructive uropathy.  3-history of alcohol abuse-no active withdrawal appreciated -Continue CIWA protocol -Continue thiamine and folic acid.  4-tobacco abuse, cocaine abuse/marijuana use -Cessation counseling has been provided -Continue nicotine patch  5-right occipital stroke/concerns for myositis  -Patient presenting with left numbness affecting left side -Abnormal electrolytes and renal  failure slightly contributed to left side weakness/numbness. -Follow results from dialysis trial -Continue electrolytes  repletion as needed -[place] neurology service recommendations patient has been started on IV steroids. Outpatient and electromyography recommended. -Will continue aspirin for secondary prevention and also statins -LDL of 80 and HDL 31; triglycerides level 241. -A1c 5.3. -Normal B12 level and elevated TSH. -CK level trending down  6-elevated TSH/hypothyroidism -Started on Synthroid and will recommend repeat thyroid profile in 4-6 weeks.  7-mixed hyperlipidemia -Hold Lipitor for now given rhabdomyolysis/elevated LFTs -Rhabdomyolysis improving  8-elevated troponin -No chest pain -No acute ischemic changes appreciated on telemetry or EKG -Reassuring 2D echo with normal ejection fraction and no wall motion abnormalities. -Patient's elevated troponin most likely associated with ongoing rhabdomyolysis and renal failure. -Continue statins and aspirin. -no further acute inpatient cardiac work up.  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 -Alcohol cessation counseling has been provided. -Trend with repeat CBC in a.m.  10-rhabdomyolysis likely secondary to cocaine use/alcohol use versus necrotizing myopathy -Repeat toxicology pending -Plans for tunnel catheter placement in a.m. -IV Solu-Medrol decreased to oral prednisone 100 mg daily which will need to be ongoing for several months according to neurology  11-polysubstance abuse -Repeat urine toxicology positive for cocaine and marijuana   DVT prophylaxis:heparin  Code Status:Full code. Family Communication:Discussed with mother 9/24 Disposition Plan:Continue close monitoring.  Repeat labs in a.m.  Plans for hemodialysis in  a.m. and further PT evaluation.  Consultants:  Nephrology service  Neurology  Cardiology service has been curbside (Dr. Bronson Ing)  General Surgery  Procedures:  See below for x-ray reports.  Antimicrobials:   None   Subjective: Patient seen and evaluated today with no new acute complaints or concerns. No acute concerns or events noted overnight.  She is tolerated tunneled catheter placement well and continues to have left arm swelling and pain.  Objective: Vitals:   09/20/19 1755 09/20/19 2105 09/21/19 0540 09/21/19 1313  BP: (!) 144/83 (!) 152/89 (!) 146/90 (!) 166/108  Pulse: 84 89 77 91  Resp: _0 Temp: 97.9 F (36.6 C) 98.7 F (37.1 C) 98.7 F (37.1 C) 98.2 F (36.8 C)  TempSrc: Oral Oral Oral   SpO2:  100% 100% 100%  Weight:      Height:        Intake/Output Summary (Last 24 hours) at 09/21/2019 1527 Last data filed at 09/21/2019 0844 Gross per 24 hour  Intake 0 ml  Output 3852 ml  Net -3852 ml   Filed Weights   09/16/19 1319 09/18/19 1645 09/20/19 1340  Weight: 82 kg 84.1 kg 85.6 kg    Examination:  General exam: Appears calm and comfortable  Respiratory system: Clear to auscultation. Respiratory effort normal. Cardiovascular system: S1 & S2 heard, RRR. No JVD, murmurs, rubs, gallops or clicks. No pedal edema. Gastrointestinal system: Abdomen is nondistended, soft and nontender. No organomegaly or masses felt. Normal bowel sounds heard. Central nervous system: Alert and oriented. No focal neurological deficits. Extremities: Left arm edematous with tenderness and swelling. Skin: No rashes, lesions or ulcers Psychiatry: Judgement and insight appear normal. Mood & affect appropriate.     Data Reviewed: I have personally reviewed following labs and imaging studies  CBC: Recent Labs  Lab 09/17/19 0042 09/18/19 0621 09/19/19 0502 09/20/19 0414 09/21/19 0456  WBC 14.8* 13.9* 12.0* 14.3* 18.2*  NEUTROABS 13.8*  --   --   --   --    HGB 11.7*  10.9* 9.9* 10.6* 10.9*  HCT 34.6* 31.8* 28.3* 31.6* 31.3*  MCV 91.1 90.3 87.9 90.0 88.4  PLT 176 177 147* 160 638   Basic Metabolic Panel: Recent Labs  Lab 09/17/19 0646 09/18/19 0621 09/19/19 0502 09/20/19 0414 09/21/19 0456  NA 134* 132* 130* 131* 130*  K 3.9 4.4 4.0 4.0 3.7  CL 99 97* 96* 95* 97*  CO2 _0 21* 21*  GLUCOSE 137* 121* 132* 136* 100*  BUN 53* 79* 69* 94* 77*  CREATININE 4.77* 6.29* 5.79* 7.31* 6.86*  CALCIUM 7.2* 7.1* 7.0* 7.4* 7.6*  PHOS  --  7.6* 5.6*  --   --    GFR: Estimated Creatinine Clearance: 10.5 mL/min (A) (by C-G formula based on SCr of 6.86 mg/dL (H)). Liver Function Tests: Recent Labs  Lab 09/15/19 0545 09/16/19 0729 09/17/19 0646 09/18/19 0621 09/19/19 0502 09/20/19 0414 09/21/19 0456  AST 1,187* 576* 431*  --   --  147* 85*  ALT 709* 475* 393*  --   --  266* 209*  ALKPHOS 115 98 84  --   --  66 59  BILITOT 0.3 0.7 0.5  --   --  0.8 0.8  PROT 6.2* 5.6* 5.7*  --   --  5.5* 5.3*  ALBUMIN 2.9* 2.4* 2.5* 2.8* 2.5* 2.8* 2.6*   No results for input(s): LIPASE, AMYLASE in the last 168 hours. No results for input(s): AMMONIA in the last 168 hours. Coagulation Profile: Recent Labs  Lab 09/20/19 0954 09/21/19 0456  INR 1.1 1.2   Cardiac Enzymes: Recent Labs  Lab 09/17/19 0646 09/18/19 0621 09/19/19 0502 09/20/19 0414 09/21/19 0456  CKTOTAL 24,838* 22,451* 12,683* 10,175* 5,115*   BNP (last 3 results) No results for input(s): PROBNP in the last 8760 hours. HbA1C: No results for input(s): HGBA1C in the last 72 hours. CBG: Recent Labs  Lab 09/20/19 1102 09/20/19 1600 09/20/19 2106 09/21/19 0729 09/21/19 1330  GLUCAP 128* 101* 162* 95 114*   Lipid Profile: No results for input(s): CHOL, HDL, LDLCALC, TRIG, CHOLHDL, LDLDIRECT in the last 72 hours. Thyroid Function Tests: No results for input(s): TSH, T4TOTAL, FREET4, T3FREE, THYROIDAB in the last 72 hours. Anemia Panel: No results for input(s):  VITAMINB12, FOLATE, FERRITIN, TIBC, IRON, RETICCTPCT in the last 72 hours. Sepsis Labs: No results for input(s): PROCALCITON, LATICACIDVEN in the last 168 hours.  Recent Results (from the past 240 hour(s))  Urine culture     Status: Abnormal   Collection Time: 09/14/19  8:41 AM   Specimen: Urine, Random  Result Value Ref Range Status   Specimen Description   Final    URINE, RANDOM Performed at [facility], [address] [place], [place] [address]    Special Requests   Final    NONE Performed at [facility], [address], [place], [place] [address]    Culture (A)  Final    30,000 COLONIES/mL GROUP B STREP(S.AGALACTIAE)ISOLATED TESTING AGAINST S. AGALACTIAE NOT ROUTINELY PERFORMED DUE TO PREDICTABILITY OF AMP/PEN/VAN SUSCEPTIBILITY. Performed at [facility], [place] [address] [place], [place] [address]    Report Status 09/16/2019 FINAL  Final  SARS Coronavirus 2 [facility] order, Performed in [facility] hospital lab) Nasopharyngeal Nasopharyngeal Swab     Status: None   Collection Time: 09/14/19 10:04 AM   Specimen: Nasopharyngeal Swab  Result Value Ref Range Status   SARS Coronavirus 2 NEGATIVE NEGATIVE Final    Comment: (NOTE) If result is NEGATIVE SARS-CoV-2 target nucleic acids are NOT DETECTED.  The SARS-CoV-2 RNA is generally detectable in upper and lower  respiratory specimens during the acute phase of infection. The lowest  concentration of SARS-CoV-2 viral copies this assay can detect is 250  copies / mL. A negative result does not preclude SARS-CoV-2 infection  and should not be used as the sole basis for treatment or other  patient management decisions.  A negative result may occur with  improper specimen collection / handling, submission of specimen other  than nasopharyngeal swab, presence of viral mutation(s) within the  areas targeted by this assay, and inadequate number of viral copies  (<250 copies / mL). A negative result must be combined with clinical    observations, patient history, and epidemiological information. If result is POSITIVE SARS-CoV-2 target nucleic acids are DETECTED. The SARS-CoV-2 RNA is generally detectable in upper and lower  respiratory specimens dur ing the acute phase of infection.  Positive  results are indicative of active infection with SARS-CoV-2.  Clinical  correlation with patient history and other diagnostic information is  necessary to determine patient infection status.  Positive results do  not rule out bacterial infection or co-infection with other viruses. If result is PRESUMPTIVE POSTIVE SARS-CoV-2 nucleic acids MAY BE PRESENT.   A presumptive positive result was obtained on the submitted specimen  and confirmed on repeat testing.  While 2019 novel coronavirus  (SARS-CoV-2) nucleic acids may be present in the submitted sample  additional confirmatory testing may be necessary for epidemiological  and / or clinical management purposes  to differentiate between  SARS-CoV-2 and other Sarbecovirus currently known to infect humans.  If clinically indicated additional testing with an alternate test  methodology [phone]) is advised. The SARS-CoV-2 RNA is generally  detectable in upper and lower respiratory sp ecimens during the acute  phase of infection. The expected result is Negative. Fact Sheet for Patients:  StrictlyIdeas.no Fact Sheet for Healthcare Providers: BankingDealers.co.za This test is not yet approved or cleared by the [place] FDA and has been authorized for detection and/or diagnosis of SARS-CoV-2 by FDA under an Emergency Use Authorization (EUA).  This EUA will remain in effect (meaning this test can be used) for the duration of the COVID-19 declaration under Section 564(b)(1) of the Act, 21 U.S.C. section 360bbb-3(b)(1), unless the authorization is terminated or revoked sooner. Performed at [facility], [address],  [place], [place] [address]   MRSA PCR Screening     Status: None   Collection Time: 09/15/19  8:45 AM   Specimen: Nasal Mucosa; Nasopharyngeal  Result Value Ref Range Status   MRSA by PCR NEGATIVE NEGATIVE Final    Comment:        The GeneXpert MRSA Assay (FDA approved for NASAL specimens only), is one component of a comprehensive MRSA colonization surveillance program. It is not intended to diagnose MRSA infection nor to guide or monitor treatment for MRSA infections. Performed at [facility], [address] [place], [place] [address]          Radiology Studies: Ir Fluoro Guide Cv Line Right  Result Date: 09/21/2019 INDICATION: End-stage renal disease. In need of durable intravenous access for the initiation of dialysis. EXAM: TUNNELED CENTRAL VENOUS HEMODIALYSIS CATHETER PLACEMENT WITH ULTRASOUND AND FLUOROSCOPIC GUIDANCE MEDICATIONS: Ancef 2 gm IV . The antibiotic was given in an appropriate time interval prior to skin puncture. ANESTHESIA/SEDATION: Versed 0.5 mg IV; Fentanyl 25 mcg IV; Moderate Sedation Time:  17 minutes The patient was continuously monitored during the procedure by the interventional radiology  nurse under my direct supervision. FLUOROSCOPY TIME:  54 seconds (6 mGy) COMPLICATIONS: None immediate. PROCEDURE: Informed written consent was obtained from the patient after a discussion of the risks, benefits, and alternatives to treatment. Questions regarding the procedure were encouraged and answered. The right neck and chest were prepped with chlorhexidine in a sterile fashion, and a sterile drape was applied covering the operative field. Maximum barrier sterile technique with sterile gowns and gloves were used for the procedure. A timeout was performed prior to the initiation of the procedure. After creating a small venotomy incision, a micropuncture kit was utilized to access the internal jugular vein. Real-time ultrasound guidance was utilized for vascular access including  the acquisition of a permanent ultrasound image documenting patency of the accessed vessel. The microwire was utilized to measure appropriate catheter length. A stiff Glidewire was advanced to the level of the IVC and the micropuncture sheath was exchanged for a peel-away sheath. A palindrome tunneled hemodialysis catheter measuring 19 cm from tip to cuff was tunneled in a retrograde fashion from the anterior chest wall to the venotomy incision. The catheter was then placed through the peel-away sheath with tips ultimately positioned within the superior aspect of the right atrium. Final catheter positioning was confirmed and documented with a spot radiographic image. The catheter aspirates and flushes normally. The catheter was flushed with appropriate volume heparin dwells. The catheter exit site was secured with a 0-Prolene retention suture. The venotomy incision was closed with an interrupted 4-0 Vicryl, Dermabond and Steri-strips. Dressings were applied. The patient tolerated the procedure well without immediate post procedural complication. IMPRESSION: Successful placement of 19 cm tip to cuff tunneled hemodialysis catheter via the right internal jugular vein with tips terminating within the superior aspect of the right atrium. The catheter is ready for immediate use. Electronically Signed   By: Sandi Mariscal M.D.   On: 09/21/2019 10:53   Ir US Guide Vasc Access Right  Result Date: 09/21/2019 INDICATION: End-stage renal disease. In need of durable intravenous access for the initiation of dialysis. EXAM: TUNNELED CENTRAL VENOUS HEMODIALYSIS CATHETER PLACEMENT WITH ULTRASOUND AND FLUOROSCOPIC GUIDANCE MEDICATIONS: Ancef 2 gm IV . The antibiotic was given in an appropriate time interval prior to skin puncture. ANESTHESIA/SEDATION: Versed 0.5 mg IV; Fentanyl 25 mcg IV; Moderate Sedation Time:  17 minutes The patient was continuously monitored during the procedure by the interventional radiology nurse under my  direct supervision. FLUOROSCOPY TIME:  54 seconds (6 mGy) COMPLICATIONS: None immediate. PROCEDURE: Informed written consent was obtained from the patient after a discussion of the risks, benefits, and alternatives to treatment. Questions regarding the procedure were encouraged and answered. The right neck and chest were prepped with chlorhexidine in a sterile fashion, and a sterile drape was applied covering the operative field. Maximum barrier sterile technique with sterile gowns and gloves were used for the procedure. A timeout was performed prior to the initiation of the procedure. After creating a small venotomy incision, a micropuncture kit was utilized to access the internal jugular vein. Real-time ultrasound guidance was utilized for vascular access including the acquisition of a permanent ultrasound image documenting patency of the accessed vessel. The microwire was utilized to measure appropriate catheter length. A stiff Glidewire was advanced to the level of the IVC and the micropuncture sheath was exchanged for a peel-away sheath. A palindrome tunneled hemodialysis catheter measuring 19 cm from tip to cuff was tunneled in a retrograde fashion from the anterior chest wall to the venotomy incision. The catheter was  then placed through the peel-away sheath with tips ultimately positioned within the superior aspect of the right atrium. Final catheter positioning was confirmed and documented with a spot radiographic image. The catheter aspirates and flushes normally. The catheter was flushed with appropriate volume heparin dwells. The catheter exit site was secured with a 0-Prolene retention suture. The venotomy incision was closed with an interrupted 4-0 Vicryl, Dermabond and Steri-strips. Dressings were applied. The patient tolerated the procedure well without immediate post procedural complication. IMPRESSION: Successful placement of 19 cm tip to cuff tunneled hemodialysis catheter via the right internal  jugular vein with tips terminating within the superior aspect of the right atrium. The catheter is ready for immediate use. Electronically Signed   By: Sandi Mariscal M.D.   On: 09/21/2019 10:53        Scheduled Meds:  amLODipine  10 mg Oral Daily   aspirin EC  81 mg Oral Daily   atorvastatin  20 mg Oral q1800   Chlorhexidine Gluconate Cloth  6 each Topical Once   And   Chlorhexidine Gluconate Cloth  6 each Topical Once   Chlorhexidine Gluconate Cloth  6 each Topical Q0600   Chlorhexidine Gluconate Cloth  6 each Topical Q0600   [START ON 09/22/2019] Chlorhexidine Gluconate Cloth  6 each Topical [id number]   folic acid  1 mg Oral Daily   insulin aspart  0-5 Units Subcutaneous QHS   insulin aspart  0-9 Units Subcutaneous TID WC   levothyroxine  50 mcg Oral Q0600   multivitamin with minerals  1 tablet Oral Daily   nicotine  14 mg Transdermal Daily   pantoprazole  40 mg Oral Daily   predniSONE  100 mg Oral Q breakfast   sodium chloride flush  3 mL Intravenous Q12H   thiamine  100 mg Oral Daily   Or   thiamine  100 mg Intravenous Daily   Continuous Infusions:  sodium chloride     sodium chloride     sodium chloride      ceFAZolin (ANCEF) IV       LOS: 7 days    Time spent: 30 minutes    Malik Paar Darleen Crocker, DO [facility] Pager [phone]  If 7PM-7AM, please contact night-coverage [email or web address] Password [facility] 09/21/2019, 3:27 PM

## 2019-09-21 NOTE — Progress Notes (Signed)
PT Cancellation Note  Patient Details Name: Alicia Flowers MRN: [id number] DOB: 07-22-1971   Cancelled Treatment:    Reason Eval/Treat Not Completed: Patient at procedure or test/unavailable Attempted therapy.  Pt out of room for procedure, will try again later if available.    [address], LPTA; [place] Aldona Lento 09/21/2019, 11:15 AM

## 2019-09-21 NOTE — TOC Progression Note (Signed)
Transition of Care [facility]) - Progression Note    Patient Details  Name: Alicia Flowers MRN: [id number] Date of Birth: Apr 28, 1971  [facility] CM/SW Contact  Ihor Gully, LCSW Phone Number: 09/21/2019, 4:03 PM  Clinical Narrative:    Ivin Booty [phone]) at [facility] Intake contacted and confirmed receipt of patient's clinicals. She stated that she was forwarding patient's documentation to the [facility] for review.    Expected Discharge Plan: Home/Self Care Barriers to Discharge: Continued Medical Work up  Expected Discharge Plan and Services Expected Discharge Plan: Home/Self Care In-house Referral: Clinical Social Work     Living arrangements for the past 2 months: Single Family Home                                       Social Determinants of Health (SDOH) Interventions    Readmission Risk Interventions No flowsheet data found.

## 2019-09-21 NOTE — Procedures (Signed)
Pre-procedure Diagnosis: ESRD Post-procedure Diagnosis: Same  Successful placement of tunneled HD catheter with tips terminating within the superior aspect of the right atrium.    Complications: None Immediate  EBL: Minimal   The catheter is ready for immediate use.   Jay Deshon Koslowski, MD Pager #: [phone]   

## 2019-09-22 LAB — COMPREHENSIVE METABOLIC PANEL
ALT: 135 U/L — ABNORMAL HIGH (ref 0–44)
AST: 53 U/L — ABNORMAL HIGH (ref 15–41)
Albumin: 2.6 g/dL — ABNORMAL LOW (ref 3.5–5.0)
Alkaline Phosphatase: 61 U/L (ref 38–126)
Anion gap: 11 (ref 5–15)
BUN: 93 mg/dL — ABNORMAL HIGH (ref 6–20)
CO2: 21 mmol/L — ABNORMAL LOW (ref 22–32)
Calcium: 7.8 mg/dL — ABNORMAL LOW (ref 8.9–10.3)
Chloride: 97 mmol/L — ABNORMAL LOW (ref 98–111)
Creatinine, Ser: 8.39 mg/dL — ABNORMAL HIGH (ref 0.44–1.00)
GFR calc Af Amer: 6 mL/min — ABNORMAL LOW (ref >60)
GFR calc non Af Amer: 5 mL/min — ABNORMAL LOW (ref >60)
Glucose, Bld: 89 mg/dL (ref 70–99)
Potassium: 4 mmol/L (ref 3.5–5.1)
Sodium: 129 mmol/L — ABNORMAL LOW (ref 135–145)
Total Bilirubin: 0.8 mg/dL (ref 0.3–1.2)
Total Protein: 5 g/dL — ABNORMAL LOW (ref 6.5–8.1)

## 2019-09-22 LAB — CBC
HCT: 31.1 % — ABNORMAL LOW (ref 36.0–46.0)
Hemoglobin: 10.6 g/dL — ABNORMAL LOW (ref 12.0–15.0)
MCH: 30.3 pg (ref 26.0–34.0)
MCHC: 34.1 g/dL (ref 30.0–36.0)
MCV: 88.9 fL (ref 80.0–100.0)
Platelets: 164 10*3/uL (ref 150–400)
RBC: 3.5 MIL/uL — ABNORMAL LOW (ref 3.87–5.11)
RDW: 14.7 % (ref 11.5–15.5)
WBC: 19.9 10*3/uL — ABNORMAL HIGH (ref 4.0–10.5)
nRBC: 0.2 % (ref 0.0–0.2)

## 2019-09-22 LAB — CK: Total CK: 2223 U/L — ABNORMAL HIGH (ref 38–234)

## 2019-09-22 LAB — GLUCOSE, CAPILLARY
Glucose-Capillary: 90 mg/dL (ref 70–99)
Glucose-Capillary: 138 mg/dL — ABNORMAL HIGH (ref 70–99)
Glucose-Capillary: 166 mg/dL — ABNORMAL HIGH (ref 70–99)
Glucose-Capillary: 182 mg/dL — ABNORMAL HIGH (ref 70–99)

## 2019-09-22 MED ORDER — BUDESONIDE 0.25 MG/2ML IN SUSP
RESPIRATORY_TRACT | Status: AC
  Filled 2019-09-22: qty 2

## 2019-09-22 NOTE — Procedures (Signed)
    HEMODIALYSIS TREATMENT NOTE:  4 hour heparin-free dialysis treatment completed via right IJ tunneled catheter. Goal met: 2 liters removed.  UF was interrupted x 15 minutes for c/o abdominal cramping relieved with NS bolus.  All blood was returned.  Rockwell Alexandria, RN

## 2019-09-22 NOTE — Progress Notes (Signed)
Physical Therapy Treatment Patient Details Name: Alicia Flowers MRN: [id number] DOB: 1971-01-15 Today's Date: 09/22/2019    History of Present Illness Patient is a 48 year old female admitted 09/14/2019 with diagnosis of CAP. PMH:  tobacco abuse, alcohol abuse, cocaine abuse and marijuana use. Numbness and weakness in L arm past couple of months. CVA (+).    PT Comments    Patient c/o fatigue and bilateral shoulder pain, had patient use RW for safety and demonstrates slow slightly labored cadence with increased endurance/distance, limited secondary to c/o fatigue.  Patient left sitting on BSC in bathroom with call light in reach, later patient put back to bed by nursing staff.  Patient will benefit from continued physical therapy in hospital and recommended venue below to increase strength, balance, endurance for safe ADLs and gait.    Follow Up Recommendations  Home health PT;Outpatient PT;Supervision for mobility/OOB;Supervision - Intermittent     Equipment Recommendations  Rolling walker with 5" wheels    Recommendations for Other Services       Precautions / Restrictions Precautions Precautions: Fall Restrictions Weight Bearing Restrictions: No    Mobility  Bed Mobility Overal bed mobility: Modified Independent             General bed mobility comments: increased time  Transfers Overall transfer level: Needs assistance Equipment used: Rolling walker (2 wheeled) Transfers: Sit to/from [facility] Sit to Stand: Min guard Stand pivot transfers: Min guard;Min assist       General transfer comment: slow labored movement  Ambulation/Gait Ambulation/Gait assistance: Min guard Gait Distance (Feet): 65 Feet Assistive device: Rolling walker (2 wheeled) Gait Pattern/deviations: Decreased step length - right;Decreased step length - left;Decreased stride length Gait velocity: decreased   General Gait Details: slightly labored cadence without loss of  balance, limited secondary to c/o fatigue   Stairs             Wheelchair Mobility    Modified Rankin (Stroke Patients Only)       Balance Overall balance assessment: Needs assistance Sitting-balance support: Feet supported;No upper extremity supported Sitting balance-Leahy Scale: Good Sitting balance - Comments: seated at bedside   Standing balance support: During functional activity;Bilateral upper extremity supported Standing balance-Leahy Scale: Fair Standing balance comment: fair/good with RW                            Cognition Arousal/Alertness: Awake/alert Behavior During Therapy: WFL for tasks assessed/performed Overall Cognitive Status: Within Functional Limits for tasks assessed                                        Exercises General Exercises - Lower Extremity Ankle Circles/Pumps: Seated;AROM;Both;10 reps;Strengthening Long Arc Quad: Seated;AROM;Strengthening;Both;10 reps Hip Flexion/Marching: Seated;AROM;Strengthening;Both;10 reps    General Comments        Pertinent Vitals/Pain Pain Score: 9  Pain Location: bilateral shoulders Pain Descriptors / Indicators: Sore;Aching Pain Intervention(s): Limited activity within patient's tolerance;Monitored during session    Home Living                      Prior Function            PT Goals (current goals can now be found in the care plan section) Acute Rehab PT Goals Patient Stated Goal: return home with family to assist PT Goal Formulation: With patient  Time For Goal Achievement: 09/30/19 Potential to Achieve Goals: Good Progress towards PT goals: Progressing toward goals    Frequency    Min 3X/week      PT Plan Current plan remains appropriate    Co-evaluation              AM-PAC PT "6 Clicks" Mobility   Outcome Measure  Help needed turning from your back to your side while in a flat bed without using bedrails?: None Help needed moving  from lying on your back to sitting on the side of a flat bed without using bedrails?: None Help needed moving to and from a bed to a chair (including a wheelchair)?: A Little Help needed standing up from a chair using your arms (e.g., wheelchair or bedside chair)?: A Little Help needed to walk in hospital room?: A Little Help needed climbing 3-5 steps with a railing? : A Lot 6 Click Score: 19    End of Session   Activity Tolerance: Patient tolerated treatment well;Patient limited by fatigue Patient left: with call bell/phone within reach(left in bathroom on [facility]) Nurse Communication: Mobility status PT Visit Diagnosis: Unsteadiness on feet (R26.81);Other abnormalities of gait and mobility (R26.89);Muscle weakness (generalized) (M62.81)     Time: [id number] PT Time Calculation (min) (ACUTE ONLY): 32 min  Charges:  $Gait Training: 8-22 mins $Therapeutic Exercise: 8-22 mins                     12:16 PM, 09/22/19 [name], MPT Physical Therapist with [facility] [phone] office [phone] mobile phone

## 2019-09-22 NOTE — Progress Notes (Signed)
°PROGRESS NOTE ° ° ° °Alicia Flowers  MRN:[id number] DOB: 01/26/1971 DOA: 09/14/2019 °PCP: Patient, No Pcp Per ° ° °Brief Narrative:  °Per HPI: °48 y.o. female with a medical history significant for tobacco abuse, alcohol abuse, cocaine abuse and marijuana use; who presented to the hospital secondary to worsening numbness in her left upper extremity and left leg.  Patient reports his symptoms have been present for approximately 3 months but worsening in the last 2 to 3 days prior to admission.  Patient also expressed feeling slightly more tired and having decrease oral intake.  No chest pain, no nausea, no vomiting, no productive cough, no sick contacts.  Patient reports to continue smoking about half pack per day and is drinking on daily basis.  Last use of marijuana was the night prior to admission.  °  °In the ED patient was found to be mildly hypoxic, with elevated WBCs, acute renal failure with a creatinine close to 5, hyperkalemic, hypophosphatemic with a normal radiologic studies suggesting community-acquired pneumonia.  She also has a positive MRI demonstrating right occipital stroke.  Cultures were taken, sodium bicarbonate, insulin and calcium gluconate provided for hyperkalemia.  Patient is started on IV antibiotics and [facility] has been contacted to admit patient for further evaluation and management. °  °9/22: Patient continues to have left arm swelling and pain.  And was due to have tunneled catheter placement for hemodialysis tomorrow.  Unfortunately, blood toxicology results will take approximately 5 days to return and anesthesia unwilling to place patient under sedation for this procedure without appropriate toxicology.  Urine drug screen is being attempted if patient will make enough urine to check.  Appreciate general surgery who may have to place IJ in the interim for dialysis as femoral line cannot stay in place for longer. °  °9/23: Patient continues to have ongoing left arm swelling and pain, but  CK levels are downtrending.  Plans are for hemodialysis today and blood pressures are elevated, therefore amlodipine added by nephrology.  Plans to remove femoral line catheter after hemodialysis today with placement of tunneled catheter in a.m. by IR at [facility] due to ongoing cocaine positivity and urine drug screen. °  °9/24: Patient has undergone tunneled catheter placement with no complications noted.  Plans for hemodialysis in a.m.  She continues to have elevated blood pressure readings as well as left arm swelling and pain, but CK levels are still downtrending. ° °9/25: Patient doing well this morning, but still continues to have left arm pain and swelling.  Blood pressure readings have improved and plans are to perform hemodialysis today.  She is being set up as an acute dialysis outpatient at the moment and remains on prednisone. ° ° °Assessment & Plan: °  °Principal Problem: °  CAP (community acquired pneumonia) °Active Problems: °  Acute renal failure (ARF) (HCC) °  Hypoxia °  Alcohol abuse °  Cocaine abuse (HCC) °  Hyperkalemia °  Metabolic acidosis °  Hyperphosphatemia °  Elevated troponin °  Oliguria and anuria ° ° °1-hypoxia/community-acquired pneumonia- resolved °-Afebrile °-WBCs were slowly trending down; anticipated confusion information after initiation of steroids as recommended by neurology service. °-Reports no chest pain °-Continue flutter valve, good oxygen saturation on room air currently.  °-Completed 5-day course of antibiotics with Rocephin °  °2-acute renal failure-anuric in setting of rhabdomyolysis °-Continue avoiding nephrotoxic agents °-Creatinine down after HD; still without urine output.  Plans for further hemodialysis today. °-Femoral catheter discontinued on 9/24 °-Tunneled catheter placed on 9/24 °-Continue supportive care. °-Follow renal function trend. °-Renal ultrasound without obstructive uropathy. °-CSW working on acute hemodialysis placement °  °3-history of alcohol  abuse-no active withdrawal   withdrawal appreciated -Continue CIWA protocol -Continue thiamine and folic acid.  4-tobacco abuse, cocaine abuse/marijuana use -Cessation counseling has been provided -Continue nicotine patch  5-right occipital stroke/concerns for myositis  -Patient presenting with left numbness affecting left side -Abnormal electrolytes and renal failure slightly contributed to left side weakness/numbness. -Follow results from dialysis trial -Continue electrolytes repletion as needed -Followin neurology service recommendations patient has been started on IV steroids. Outpatient and electromyography recommended. -Will continue aspirin for secondary prevention and also statins -LDL of 80 and HDL 31; triglycerides level 241. -A1c 5.3. -Normal B12 level and elevated TSH. -CK level trending down  6-elevated TSH/hypothyroidism -Started on Synthroid and will recommend repeat thyroid profile in 4-6 weeks.  7-mixed hyperlipidemia -Hold Lipitor for now given rhabdomyolysis/elevated LFTs -Rhabdomyolysis improving  8-elevated troponin -No chest pain -No acute ischemic changes appreciated on telemetry or EKG -Reassuring 2D echo with normal ejection fraction and no wall motion abnormalities. -Patient's elevated troponin most likely associated with ongoing rhabdomyolysis and renal failure. -Continue statins and aspirin. -no further acute inpatient cardiac work up.  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 -Alcohol cessation counseling has been provided. -Trend with repeat CMP in a.m.  10-rhabdomyolysis likely secondary to cocaine use/alcohol use versus necrotizing myopathy-downtrending -Repeat toxicology pending -Plans for tunnel catheter placement in a.m. -Continue oral prednisone 100  mg daily, and will clarify with neurology length of treatment and tapering protocol with follow-up outpatient.  11-polysubstance abuse -Repeat urine toxicology positive for cocaine and marijuana   DVT prophylaxis:heparin  Code Status:Full code. Family Communication:Discussed with mother 9/24 Disposition Plan:Continue close monitoring.  Repeat labs in a.m.   plans for hemodialysis today and repeat PT evaluation.  Consultants:  Nephrology service  Neurology  Cardiology service has been curbside (Dr. Bronson Ing)  General Surgery  Procedures:  See below for x-ray reports.  Antimicrobials:   None  Subjective: Patient seen and evaluated today with no new acute complaints or concerns. No acute concerns or events noted overnight.  Patient continues to have left arm pain and swelling with tenderness.  She has some tenderness also at the site of catheter insertion with plans to undergo hemodialysis today.  Objective: Vitals:   09/21/19 0540 09/21/19 1313 09/21/19 2117 09/22/19 0615  BP: (!) 146/90 (!) 166/108 (!) 157/90 (!) 151/87  Pulse: 77 91 85 85  Resp: _0 Temp: 98.7 F (37.1 C) 98.2 F (36.8 C) 98.3 F (36.8 C) 98.2 F (36.8 C)  TempSrc: Oral  Oral Oral  SpO2: 100% 100% 100% 100%  Weight:    80.8 kg  Height:        Intake/Output Summary (Last 24 hours) at 09/22/2019 1136 Last data filed at 09/22/2019 0900 Gross per 24 hour  Intake 240 ml  Output 500 ml  Net -260 ml   Filed Weights   09/18/19 1645 09/20/19 1340 09/22/19 0615  Weight: 84.1 kg 85.6 kg 80.8 kg    Examination:  General exam: Appears calm and comfortable  Respiratory system: Clear to auscultation. Respiratory effort normal. Cardiovascular system: S1 & S2 heard, RRR. No JVD, murmurs, rubs, gallops or clicks. No pedal edema. Gastrointestinal system: Abdomen is nondistended, soft and nontender. No organomegaly or masses felt. Normal bowel sounds heard. Central nervous system:  Alert and oriented. No focal neurological deficits. Extremities: Symmetric 5 x 5 power.  Left arm swelling  tenderness. °Skin: No rashes, lesions or ulcers °Psychiatry: Judgement and insight appear normal. Mood & affect appropriate.  ° ° ° °Data Reviewed: I have personally reviewed following labs and imaging studies ° °CBC: °Recent Labs  °Lab 09/17/19 °0042 09/18/19 °0621 09/19/19 °0502 09/20/19 °0414 09/21/19 °0456 09/22/19 °0542  °WBC 14.8* 13.9* 12.0* 14.3* 18.2* 19.9*  °NEUTROABS 13.8*  --   --   --   --   --   °HGB 11.7* 10.9* 9.9* 10.6* 10.9* 10.6*  °HCT 34.6* 31.8* 28.3* 31.6* 31.3* 31.1*  °MCV 91.1 90.3 87.9 90.0 88.4 88.9  °PLT 176 177 147* 160 180 164  ° °Basic Metabolic Panel: °Recent Labs  °Lab 09/18/19 °0621 09/19/19 °0502 09/20/19 °0414 09/21/19 °0456 09/22/19 °0542  °NA 132* 130* 131* 130* 129*  °K 4.4 4.0 4.0 3.7 4.0  °CL 97* 96* 95* 97* 97*  °CO2 22 23 21* 21* 21*  °GLUCOSE 121* 132* 136* 100* 89  °BUN 79* 69* 94* 77* 93*  °CREATININE 6.29* 5.79* 7.31* 6.86* 8.39*  °CALCIUM 7.1* 7.0* 7.4* 7.6* 7.8*  °PHOS 7.6* 5.6*  --   --   --   ° °GFR: °Estimated Creatinine Clearance: 8.3 mL/min (A) (by C-G formula based on SCr of 8.39 mg/dL (H)). °Liver Function Tests: °Recent Labs  °Lab 09/16/19 °0729 09/17/19 °0646 09/18/19 °0621 09/19/19 °0502 09/20/19 °0414 09/21/19 °0456 09/22/19 °0542  °AST 576* 431*  --   --  147* 85* 53*  °ALT 475* 393*  --   --  266* 209* 135*  °ALKPHOS 98 84  --   --  66 59 61  °BILITOT 0.7 0.5  --   --  0.8 0.8 0.8  °PROT 5.6* 5.7*  --   --  5.5* 5.3* 5.0*  °ALBUMIN 2.4* 2.5* 2.8* 2.5* 2.8* 2.6* 2.6*  ° °No results for input(s): LIPASE, AMYLASE in the last 168 hours. °No results for input(s): AMMONIA in the last 168 hours. °Coagulation Profile: °Recent Labs  °Lab 09/20/19 °0954 09/21/19 °0456  °INR 1.1 1.2  ° °Cardiac Enzymes: °Recent Labs  °Lab 09/18/19 °0621 09/19/19 °0502 09/20/19 °0414 09/21/19 °0456 09/22/19 °0542  °CKTOTAL 22,451* 12,683* 10,175* 5,115* 2,223*  ° °BNP (last  3 results) °No results for input(s): PROBNP in the last 8760 hours. °HbA1C: °No results for input(s): HGBA1C in the last 72 hours. °CBG: °Recent Labs  °Lab 09/21/19 °1330 09/21/19 °1628 09/21/19 °2119 09/22/19 °0816 09/22/19 °1114  °GLUCAP 114* 113* 95 90 138*  ° °Lipid Profile: °No results for input(s): CHOL, HDL, LDLCALC, TRIG, CHOLHDL, LDLDIRECT in the last 72 hours. °Thyroid Function Tests: °No results for input(s): TSH, T4TOTAL, FREET4, T3FREE, THYROIDAB in the last 72 hours. °Anemia Panel: °No results for input(s): VITAMINB12, FOLATE, FERRITIN, TIBC, IRON, RETICCTPCT in the last 72 hours. °Sepsis Labs: °No results for input(s): PROCALCITON, LATICACIDVEN in the last 168 hours. ° °Recent Results (from the past 240 hour(s))  °Urine culture     Status: Abnormal  ° Collection Time: 09/14/19  8:41 AM  ° Specimen: Urine, Random  °Result Value Ref Range Status  ° Specimen Description   Final  °  URINE, RANDOM °Performed at [facility], [address] [place], [place] [address] °  ° Special Requests   Final  °  NONE °Performed at [facility], [address] [place], [place] [address] °  ° Culture (A)  Final  °  30,000 COLONIES/mL GROUP B STREP(S.AGALACTIAE)ISOLATED °TESTING AGAINST S. AGALACTIAE NOT ROUTINELY PERFORMED DUE TO PREDICTABILITY OF AMP/PEN/VAN SUSCEPTIBILITY. °Performed at   [facility], [address] [place], [place] [address] °  ° Report Status 09/16/2019 FINAL  Final  °SARS Coronavirus 2 (Hospital order, Performed in [facility] hospital lab) Nasopharyngeal Nasopharyngeal Swab     Status: None  ° Collection Time: 09/14/19 10:04 AM  ° Specimen: Nasopharyngeal Swab  °Result Value Ref Range Status  ° SARS Coronavirus 2 NEGATIVE NEGATIVE Final  °  Comment: (NOTE) °If result is NEGATIVE °SARS-CoV-2 target nucleic acids are NOT DETECTED. °The SARS-CoV-2 RNA is generally detectable in upper and lower  °respiratory specimens during the acute phase of infection. The lowest  °concentration of SARS-CoV-2  viral copies this assay can detect is 250  °copies / mL. A negative result does not preclude SARS-CoV-2 infection  °and should not be used as the sole basis for treatment or other  °patient management decisions.  A negative result may occur with  °improper specimen collection / handling, submission of specimen other  °than nasopharyngeal swab, presence of viral mutation(s) within the  °areas targeted by this assay, and inadequate number of viral copies  °(<250 copies / mL). A negative result must be combined with clinical  °observations, patient history, and epidemiological information. °If result is POSITIVE °SARS-CoV-2 target nucleic acids are DETECTED. °The SARS-CoV-2 RNA is generally detectable in upper and lower  °respiratory specimens dur °ing the acute phase of infection.  Positive  °results are indicative of active infection with SARS-CoV-2.  Clinical  °correlation with patient history and other diagnostic information is  °necessary to determine patient infection status.  Positive results do  °not rule out bacterial infection or co-infection with other viruses. °If result is PRESUMPTIVE POSTIVE °SARS-CoV-2 nucleic acids MAY BE PRESENT.   °A presumptive positive result was obtained on the submitted specimen  °and confirmed on repeat testing.  While 2019 novel coronavirus  °(SARS-CoV-2) nucleic acids may be present in the submitted sample  °additional confirmatory testing may be necessary for epidemiological  °and / or clinical management purposes  to differentiate between  °SARS-CoV-2 and other Sarbecovirus currently known to infect humans.  °If clinically indicated additional testing with an alternate test  °methodology ([id number]) is advised. The SARS-CoV-2 RNA is generally  °detectable in upper and lower respiratory sp °ecimens during the acute  °phase of infection. °The expected result is Negative. °Fact Sheet for Patients:  https://www.fda.gov/media/136312/download °Fact Sheet for Healthcare  Providers: °https://www.fda.gov/media/136313/download °This test is not yet approved or cleared by the [place] FDA and °has been authorized for detection and/or diagnosis of SARS-CoV-2 by °FDA under an Emergency Use Authorization (EUA).  This EUA will remain °in effect (meaning this test can be used) for the duration of the °COVID-19 declaration under Section 564(b)(1) of the Act, 21 U.S.C. °section 360bbb-3(b)(1), unless the authorization is terminated or °revoked sooner. °Performed at [facility], [address] [place], [place] [address] °  °MRSA PCR Screening     Status: None  ° Collection Time: 09/15/19  8:45 AM  ° Specimen: Nasal Mucosa; Nasopharyngeal  °Result Value Ref Range Status  ° MRSA by PCR NEGATIVE NEGATIVE Final  °  Comment:        °The GeneXpert MRSA Assay (FDA °approved for NASAL specimens °only), is one component of a °comprehensive MRSA colonization °surveillance program. It is not °intended to diagnose MRSA °infection nor to guide or °monitor treatment for °MRSA infections. °Performed at  Hospital, [address] [place], [place] [address] °  °  ° ° ° ° ° °Radiology Studies: °Ir   Fluoro Guide Cv Line Right ° °Result Date: 09/21/2019 °INDICATION: End-stage renal disease. In need of durable intravenous access for the initiation of dialysis. EXAM: TUNNELED CENTRAL VENOUS HEMODIALYSIS CATHETER PLACEMENT WITH ULTRASOUND AND FLUOROSCOPIC GUIDANCE MEDICATIONS: Ancef 2 gm IV . The antibiotic was given in an appropriate time interval prior to skin puncture. ANESTHESIA/SEDATION: Versed 0.5 mg IV; Fentanyl 25 mcg IV; Moderate Sedation Time:  17 minutes The patient was continuously monitored during the procedure by the interventional radiology nurse under my direct supervision. FLUOROSCOPY TIME:  54 seconds (6 mGy) COMPLICATIONS: None immediate. PROCEDURE: Informed written consent was obtained from the patient after a discussion of the risks, benefits, and alternatives to treatment. Questions  regarding the procedure were encouraged and answered. The right neck and chest were prepped with chlorhexidine in a sterile fashion, and a sterile drape was applied covering the operative field. Maximum barrier sterile technique with sterile gowns and gloves were used for the procedure. A timeout was performed prior to the initiation of the procedure. After creating a small venotomy incision, a micropuncture kit was utilized to access the internal jugular vein. Real-time ultrasound guidance was utilized for vascular access including the acquisition of a permanent ultrasound image documenting patency of the accessed vessel. The microwire was utilized to measure appropriate catheter length. A stiff Glidewire was advanced to the level of the IVC and the micropuncture sheath was exchanged for a peel-away sheath. A palindrome tunneled hemodialysis catheter measuring 19 cm from tip to cuff was tunneled in a retrograde fashion from the anterior chest wall to the venotomy incision. The catheter was then placed through the peel-away sheath with tips ultimately positioned within the superior aspect of the right atrium. Final catheter positioning was confirmed and documented with a spot radiographic image. The catheter aspirates and flushes normally. The catheter was flushed with appropriate volume heparin dwells. The catheter exit site was secured with a 0-Prolene retention suture. The venotomy incision was closed with an interrupted 4-0 Vicryl, Dermabond and Steri-strips. Dressings were applied. The patient tolerated the procedure well without immediate post procedural complication. IMPRESSION: Successful placement of 19 cm tip to cuff tunneled hemodialysis catheter via the right internal jugular vein with tips terminating within the superior aspect of the right atrium. The catheter is ready for immediate use. Electronically Signed   By: John  Watts M.D.   On: 09/21/2019 10:53  ° °Ir Us Guide Vasc Access Right ° °Result  Date: 09/21/2019 °INDICATION: End-stage renal disease. In need of durable intravenous access for the initiation of dialysis. EXAM: TUNNELED CENTRAL VENOUS HEMODIALYSIS CATHETER PLACEMENT WITH ULTRASOUND AND FLUOROSCOPIC GUIDANCE MEDICATIONS: Ancef 2 gm IV . The antibiotic was given in an appropriate time interval prior to skin puncture. ANESTHESIA/SEDATION: Versed 0.5 mg IV; Fentanyl 25 mcg IV; Moderate Sedation Time:  17 minutes The patient was continuously monitored during the procedure by the interventional radiology nurse under my direct supervision. FLUOROSCOPY TIME:  54 seconds (6 mGy) COMPLICATIONS: None immediate. PROCEDURE: Informed written consent was obtained from the patient after a discussion of the risks, benefits, and alternatives to treatment. Questions regarding the procedure were encouraged and answered. The right neck and chest were prepped with chlorhexidine in a sterile fashion, and a sterile drape was applied covering the operative field. Maximum barrier sterile technique with sterile gowns and gloves were used for the procedure. A timeout was performed prior to the initiation of the procedure. After creating a small venotomy incision, a micropuncture kit was utilized to access the internal jugular vein.   Real-time ultrasound guidance was utilized for vascular access including the acquisition of a permanent ultrasound image documenting patency of the accessed vessel. The microwire was utilized to measure appropriate catheter length. A stiff Glidewire was advanced to the level of the IVC and the micropuncture sheath was exchanged for a peel-away sheath. A palindrome tunneled hemodialysis catheter measuring 19 cm from tip to cuff was tunneled in a retrograde fashion from the anterior chest wall to the venotomy incision. The catheter was then placed through the peel-away sheath with tips ultimately positioned within the superior aspect of the right atrium. Final catheter positioning was confirmed  and documented with a spot radiographic image. The catheter aspirates and flushes normally. The catheter was flushed with appropriate volume heparin dwells. The catheter exit site was secured with a 0-Prolene retention suture. The venotomy incision was closed with an interrupted 4-0 Vicryl, Dermabond and Steri-strips. Dressings were applied. The patient tolerated the procedure well without immediate post procedural complication. IMPRESSION: Successful placement of 19 cm tip to cuff tunneled hemodialysis catheter via the right internal jugular vein with tips terminating within the superior aspect of the right atrium. The catheter is ready for immediate use. Electronically Signed   By: John  Watts M.D.   On: 09/21/2019 10:53  ° ° ° ° ° ° °Scheduled Meds: °• amLODipine  10 mg Oral Daily  °• aspirin EC  81 mg Oral Daily  °• atorvastatin  20 mg Oral q1800  °• budesonide      °• Chlorhexidine Gluconate Cloth  6 each Topical Once  ° And  °• Chlorhexidine Gluconate Cloth  6 each Topical Once  °• Chlorhexidine Gluconate Cloth  6 each Topical Q0600  °• Chlorhexidine Gluconate Cloth  6 each Topical Q0600  °• Chlorhexidine Gluconate Cloth  6 each Topical Q0600  °• folic acid  1 mg Oral Daily  °• insulin aspart  0-5 Units Subcutaneous QHS  °• insulin aspart  0-9 Units Subcutaneous TID WC  °• levothyroxine  50 mcg Oral Q0600  °• multivitamin with minerals  1 tablet Oral Daily  °• nicotine  14 mg Transdermal Daily  °• pantoprazole  40 mg Oral Daily  °• predniSONE  100 mg Oral Q breakfast  °• sodium chloride flush  3 mL Intravenous Q12H  °• thiamine  100 mg Oral Daily  ° Or  °• thiamine  100 mg Intravenous Daily  ° °Continuous Infusions: °• sodium chloride    °• sodium chloride    °• sodium chloride    ° ° ° LOS: 8 days  ° ° °Time spent: 30 minutes ° ° ° °Pratik D Shah, DO °[facility] °Pager [phone] ° °If 7PM-7AM, please contact night-coverage [email or web address] °Password [email or web address] °09/22/2019, 11:36 AM  ° °

## 2019-09-22 NOTE — Progress Notes (Signed)
Patient ID: Alicia Flowers, female   DOB: November 26, 1971, 48 y.o.   MRN: [id number]   S:   Status post TDC yesterday with IR  UOP 0.5 L yesterday  CK improved to 2200, arm remains swollen, tender distal pulses remain intact and hand is warm  Renal labs consistent with minimal GFR, K4.0, bicarbonate 21  Investigating if patient can receive outpatient HD as AKI  R Fem Temp HD cath has been removed.   O:BP (!) 151/87 (BP Location: Right Arm)   Pulse 85   Temp 98.2 F (36.8 C) (Oral)   Resp 18   Ht 5' 3"  (1.6 m)   Wt 80.8 kg   LMP 09/07/2019   SpO2 100%   BMI 31.55 kg/m   Intake/Output Summary (Last 24 hours) at 09/22/2019 0919 Last data filed at 09/22/2019 0646 Gross per 24 hour  Intake -  Output 500 ml  Net -500 ml   Intake/Output: I/O last 3 completed shifts: In: 0  Out: 600 [Urine:600]  Intake/Output this shift:  No intake/output data recorded. Weight change: -4.8 kg Gen: NAD, lying in bed HEENT: EOMI PERRL CVS: RRR no m/r/g Resp: cta with decreased BS at bases Abd: +BS, soft, NT Ext: +2 edema in LUE R IJ [facility] C/D/I  Recent Labs  Lab 09/16/19 0729 09/17/19 0646 09/18/19 0621 09/19/19 0502 09/20/19 0414 09/21/19 0456 09/22/19 0542  NA 138 134* 132* 130* 131* 130* 129*  K 4.9 3.9 4.4 4.0 4.0 3.7 4.0  CL 101 99 97* 96* 95* 97* 97*  CO2 24 22 22 23  21* 21* 21*  GLUCOSE 94 137* 121* 132* 136* 100* 89  BUN 55* 53* 79* 69* 94* 77* 93*  CREATININE 4.82* 4.77* 6.29* 5.79* 7.31* 6.86* 8.39*  ALBUMIN 2.4* 2.5* 2.8* 2.5* 2.8* 2.6* 2.6*  CALCIUM 6.4* 7.2* 7.1* 7.0* 7.4* 7.6* 7.8*  PHOS  --   --  7.6* 5.6*  --   --   --   AST 576* 431*  --   --  147* 85* 53*  ALT 475* 393*  --   --  266* 209* 135*   Liver Function Tests: Recent Labs  Lab 09/20/19 0414 09/21/19 0456 09/22/19 0542  AST 147* 85* 53*  ALT 266* 209* 135*  ALKPHOS 66 59 61  BILITOT 0.8 0.8 0.8  PROT 5.5* 5.3* 5.0*  ALBUMIN 2.8* 2.6* 2.6*   No results for input(s): LIPASE, AMYLASE in the last  168 hours. No results for input(s): AMMONIA in the last 168 hours. CBC: Recent Labs  Lab 09/17/19 0042 09/18/19 0621 09/19/19 0502 09/20/19 0414 09/21/19 0456 09/22/19 0542  WBC 14.8* 13.9* 12.0* 14.3* 18.2* 19.9*  NEUTROABS 13.8*  --   --   --   --   --   HGB 11.7* 10.9* 9.9* 10.6* 10.9* 10.6*  HCT 34.6* 31.8* 28.3* 31.6* 31.3* 31.1*  MCV 91.1 90.3 87.9 90.0 88.4 88.9  PLT 176 177 147* 160 180 164   Cardiac Enzymes: Recent Labs  Lab 09/18/19 0621 09/19/19 0502 09/20/19 0414 09/21/19 0456 09/22/19 0542  CKTOTAL 22,451* 12,683* 10,175* 5,115* 2,223*   CBG: Recent Labs  Lab 09/21/19 0729 09/21/19 1330 09/21/19 1628 09/21/19 2119 09/22/19 0816  GLUCAP 95 114* 113* 95 90    Iron Studies: No results for input(s): IRON, TIBC, TRANSFERRIN, FERRITIN in the last 72 hours. Studies/Results: Ir Fluoro Guide Cv Line Right  Result Date: 09/21/2019 INDICATION: End-stage renal disease. In need of durable intravenous access for the initiation of dialysis.  EXAM: TUNNELED CENTRAL VENOUS HEMODIALYSIS CATHETER PLACEMENT WITH ULTRASOUND AND FLUOROSCOPIC GUIDANCE MEDICATIONS: Ancef 2 gm IV . The antibiotic was given in an appropriate time interval prior to skin puncture. ANESTHESIA/SEDATION: Versed 0.5 mg IV; Fentanyl 25 mcg IV; Moderate Sedation Time:  17 minutes The patient was continuously monitored during the procedure by the interventional radiology nurse under my direct supervision. FLUOROSCOPY TIME:  54 seconds (6 mGy) COMPLICATIONS: None immediate. PROCEDURE: Informed written consent was obtained from the patient after a discussion of the risks, benefits, and alternatives to treatment. Questions regarding the procedure were encouraged and answered. The right neck and chest were prepped with chlorhexidine in a sterile fashion, and a sterile drape was applied covering the operative field. Maximum barrier sterile technique with sterile gowns and gloves were used for the procedure. A timeout  was performed prior to the initiation of the procedure. After creating a small venotomy incision, a micropuncture kit was utilized to access the internal jugular vein. Real-time ultrasound guidance was utilized for vascular access including the acquisition of a permanent ultrasound image documenting patency of the accessed vessel. The microwire was utilized to measure appropriate catheter length. A stiff Glidewire was advanced to the level of the IVC and the micropuncture sheath was exchanged for a peel-away sheath. A palindrome tunneled hemodialysis catheter measuring 19 cm from tip to cuff was tunneled in a retrograde fashion from the anterior chest wall to the venotomy incision. The catheter was then placed through the peel-away sheath with tips ultimately positioned within the superior aspect of the right atrium. Final catheter positioning was confirmed and documented with a spot radiographic image. The catheter aspirates and flushes normally. The catheter was flushed with appropriate volume heparin dwells. The catheter exit site was secured with a 0-Prolene retention suture. The venotomy incision was closed with an interrupted 4-0 Vicryl, Dermabond and Steri-strips. Dressings were applied. The patient tolerated the procedure well without immediate post procedural complication. IMPRESSION: Successful placement of 19 cm tip to cuff tunneled hemodialysis catheter via the right internal jugular vein with tips terminating within the superior aspect of the right atrium. The catheter is ready for immediate use. Electronically Signed   By: Sandi Mariscal M.D.   On: 09/21/2019 10:53   Ir US Guide Vasc Access Right  Result Date: 09/21/2019 INDICATION: End-stage renal disease. In need of durable intravenous access for the initiation of dialysis. EXAM: TUNNELED CENTRAL VENOUS HEMODIALYSIS CATHETER PLACEMENT WITH ULTRASOUND AND FLUOROSCOPIC GUIDANCE MEDICATIONS: Ancef 2 gm IV . The antibiotic was given in an appropriate  time interval prior to skin puncture. ANESTHESIA/SEDATION: Versed 0.5 mg IV; Fentanyl 25 mcg IV; Moderate Sedation Time:  17 minutes The patient was continuously monitored during the procedure by the interventional radiology nurse under my direct supervision. FLUOROSCOPY TIME:  54 seconds (6 mGy) COMPLICATIONS: None immediate. PROCEDURE: Informed written consent was obtained from the patient after a discussion of the risks, benefits, and alternatives to treatment. Questions regarding the procedure were encouraged and answered. The right neck and chest were prepped with chlorhexidine in a sterile fashion, and a sterile drape was applied covering the operative field. Maximum barrier sterile technique with sterile gowns and gloves were used for the procedure. A timeout was performed prior to the initiation of the procedure. After creating a small venotomy incision, a micropuncture kit was utilized to access the internal jugular vein. Real-time ultrasound guidance was utilized for vascular access including the acquisition of a permanent ultrasound image documenting patency of the accessed vessel. The microwire  was utilized to measure appropriate catheter length. A stiff Glidewire was advanced to the level of the IVC and the micropuncture sheath was exchanged for a peel-away sheath. A palindrome tunneled hemodialysis catheter measuring 19 cm from tip to cuff was tunneled in a retrograde fashion from the anterior chest wall to the venotomy incision. The catheter was then placed through the peel-away sheath with tips ultimately positioned within the superior aspect of the right atrium. Final catheter positioning was confirmed and documented with a spot radiographic image. The catheter aspirates and flushes normally. The catheter was flushed with appropriate volume heparin dwells. The catheter exit site was secured with a 0-Prolene retention suture. The venotomy incision was closed with an interrupted 4-0 Vicryl, Dermabond  and Steri-strips. Dressings were applied. The patient tolerated the procedure well without immediate post procedural complication. IMPRESSION: Successful placement of 19 cm tip to cuff tunneled hemodialysis catheter via the right internal jugular vein with tips terminating within the superior aspect of the right atrium. The catheter is ready for immediate use. Electronically Signed   By: Sandi Mariscal M.D.   On: 09/21/2019 10:53   . amLODipine  10 mg Oral Daily  . aspirin EC  81 mg Oral Daily  . atorvastatin  20 mg Oral q1800  . budesonide      . Chlorhexidine Gluconate Cloth  6 each Topical Once   And  . Chlorhexidine Gluconate Cloth  6 each Topical Once  . Chlorhexidine Gluconate Cloth  6 each Topical Q0600  . Chlorhexidine Gluconate Cloth  6 each Topical Q0600  . Chlorhexidine Gluconate Cloth  6 each Topical Q0600  . folic acid  1 mg Oral Daily  . insulin aspart  0-5 Units Subcutaneous QHS  . insulin aspart  0-9 Units Subcutaneous TID WC  . levothyroxine  50 mcg Oral Q0600  . multivitamin with minerals  1 tablet Oral Daily  . nicotine  14 mg Transdermal Daily  . pantoprazole  40 mg Oral Daily  . predniSONE  100 mg Oral Q breakfast  . sodium chloride flush  3 mL Intravenous Q12H  . thiamine  100 mg Oral Daily   Or  . thiamine  100 mg Intravenous Daily    BMET    Component Value Date/Time   NA 129 (L) 09/22/2019 0542   K 4.0 09/22/2019 0542   CL 97 (L) 09/22/2019 0542   CO2 21 (L) 09/22/2019 0542   GLUCOSE 89 09/22/2019 0542   BUN 93 (H) 09/22/2019 0542   CREATININE 8.39 (H) 09/22/2019 0542   CALCIUM 7.8 (L) 09/22/2019 0542   GFRNONAA 5 (L) 09/22/2019 0542   GFRAA 6 (L) 09/22/2019 0542   CBC    Component Value Date/Time   WBC 19.9 (H) 09/22/2019 0542   RBC 3.50 (L) 09/22/2019 0542   HGB 10.6 (L) 09/22/2019 0542   HCT 31.1 (L) 09/22/2019 0542   PLT 164 09/22/2019 0542   MCV 88.9 09/22/2019 0542   MCH 30.3 09/22/2019 0542   MCHC 34.1 09/22/2019 0542   RDW 14.7  09/22/2019 0542   LYMPHSABS 0.6 (L) 09/17/2019 0042   MONOABS 0.4 09/17/2019 0042   EOSABS 0.0 09/17/2019 0042   BASOSABS 0.0 09/17/2019 0042     Assessment: 1. Anuric AKI- in setting of rhabdomyolysis presumably due to cocaine and alcohol use.  No history of loss of consciousness but unclear.  Renal US without obstruction.  Acute GN workup w/o sig positive findings.  ANA +, complements WNL, antiDNA antibody negative, ASO titer,  ANCA, and anti GBM normal.   2. Rhabdomyolysis- presumably from cocaine/alcohol and possible LOC.  Follow CK levels. Remain elevated but slowly improving; neuro treating as inflammatory myositis on pred 3. Right occipital infarct- neuro 4. CAP/sepsis- on antibiotics per primary svc 5. Hyperkalemia-   resolved  6. Left arm weakness- possibly related to right cva vs rhabdo, neuro following 7. Polysubstance abuse- SW consulted.  Will likely need withdrawal protocol as she admits to drinking daily 8. Abnormal EKG with new partial RBBB- per [facility]  Likely cocaine related.  9. Progressive elevation of blood pressures, hypervolemia  Plan:  HD today.  4h, goal 4L UF, 3K, No heparin.  AKI CLIP eval in process Follow BP with UF Follow UOP and renal labs over 2d weekend

## 2019-09-22 NOTE — TOC Progression Note (Signed)
Transition of Care [facility]) - Progression Note    Patient Details  Name: ALEEYA DEMBY MRN: [id number] Date of Birth: 1971-02-15  Transition of Care [facility]) CM/SW Contact  Ihor Gully, LCSW Phone Number: 09/22/2019, 1:25 PM  Clinical Narrative:    Call from [facility] with [employer]. Pam indicates that due to patient have a diagnosis of AKI and not ESRD with no insurance the center would have to bill patient for each treatment. Patient cannot afford this. Advised Pam that follow up with patient would be necessary for her to identify if her insurance through [place] was effective currently. Spoke with patient who and educated on situation. Patient stated that she would contact [employer] to identify if her insurance was active. Patient was provided LCSW contact number and fax number in the event that documentation needs to be faxed to her.   Expected Discharge Plan: Home/Self Care Barriers to Discharge: Continued Medical Work up  Expected Discharge Plan and Services Expected Discharge Plan: Home/Self Care In-house Referral: Clinical Social Work     Living arrangements for the past 2 months: Single Family Home                                       Social Determinants of Health (SDOH) Interventions    Readmission Risk Interventions No flowsheet data found.

## 2019-09-23 LAB — CBC
HCT: 26.5 % — ABNORMAL LOW (ref 36.0–46.0)
Hemoglobin: 9.1 g/dL — ABNORMAL LOW (ref 12.0–15.0)
MCH: 30.6 pg (ref 26.0–34.0)
MCHC: 34.3 g/dL (ref 30.0–36.0)
MCV: 89.2 fL (ref 80.0–100.0)
Platelets: 171 10*3/uL (ref 150–400)
RBC: 2.97 MIL/uL — ABNORMAL LOW (ref 3.87–5.11)
RDW: 15.1 % (ref 11.5–15.5)
WBC: 20.2 10*3/uL — ABNORMAL HIGH (ref 4.0–10.5)
nRBC: 0 % (ref 0.0–0.2)

## 2019-09-23 LAB — GLUCOSE, CAPILLARY
Glucose-Capillary: 114 mg/dL — ABNORMAL HIGH (ref 70–99)
Glucose-Capillary: 152 mg/dL — ABNORMAL HIGH (ref 70–99)
Glucose-Capillary: 129 mg/dL — ABNORMAL HIGH (ref 70–99)
Glucose-Capillary: 122 mg/dL — ABNORMAL HIGH (ref 70–99)

## 2019-09-23 LAB — COMPREHENSIVE METABOLIC PANEL
ALT: 89 U/L — ABNORMAL HIGH (ref 0–44)
AST: 42 U/L — ABNORMAL HIGH (ref 15–41)
Albumin: 2.5 g/dL — ABNORMAL LOW (ref 3.5–5.0)
Alkaline Phosphatase: 52 U/L (ref 38–126)
Anion gap: 9 (ref 5–15)
BUN: 41 mg/dL — ABNORMAL HIGH (ref 6–20)
CO2: 26 mmol/L (ref 22–32)
Calcium: 7.9 mg/dL — ABNORMAL LOW (ref 8.9–10.3)
Chloride: 97 mmol/L — ABNORMAL LOW (ref 98–111)
Creatinine, Ser: 4.54 mg/dL — ABNORMAL HIGH (ref 0.44–1.00)
GFR calc Af Amer: 12 mL/min — ABNORMAL LOW (ref >60)
GFR calc non Af Amer: 11 mL/min — ABNORMAL LOW (ref >60)
Glucose, Bld: 123 mg/dL — ABNORMAL HIGH (ref 70–99)
Potassium: 3.7 mmol/L (ref 3.5–5.1)
Sodium: 132 mmol/L — ABNORMAL LOW (ref 135–145)
Total Bilirubin: 0.9 mg/dL (ref 0.3–1.2)
Total Protein: 4.9 g/dL — ABNORMAL LOW (ref 6.5–8.1)

## 2019-09-23 LAB — CK: Total CK: 1075 U/L — ABNORMAL HIGH (ref 38–234)

## 2019-09-23 NOTE — Progress Notes (Signed)
[facility] Progress Note    Assessment/ Plan:   1. Anuric AKI- in setting of rhabdomyolysis presumably due to cocaine and alcohol use.  No history of loss of consciousness but unclear.  Renal US without obstruction. Acute GN workup w/o sig positive findings. ANA +, complements WNL, antiDNA antibody negative, ASO titer, ANCA, and anti GBM normal.   2. Rhabdomyolysis- presumably from cocaine/alcohol and possible LOC.  Follow CK levels. Remain elevated but slowly improving; neuro treating as inflammatory myositis on pred 3. Right occipital infarct- neuro 4. CAP/sepsis- on antibiotics per primary svc 5. Hyperkalemia-  resolved  6. Left arm weakness- possibly related to right cva vs rhabdo, neuro following 7. Polysubstance abuse- SW consulted. Will likely need withdrawal protocol as she admits to drinking daily 8. Abnormal EKG with new partial RBBB- per [facility] Likely cocaine related.  9. Progressive elevation of blood pressures, hypervolemia 10. AKI CLIP eval in process - Will follow  UOP and renal labs over weekend - Last HD Fri -> plan next HD Monday unless there are signs of recovery  Subjective:    Status post [facility] yesterday with IR  UOP 0.4 L yesterday  CK improved to 1075, arm remains swollen, tender distal pulses remain intact and hand is warm  Renal labs consistent with minimal GFR, K4.0, bicarbonate 26  Investigating if patient can receive outpatient HD as AKI  R Fem Temp HD cath has been removed.    Objective:   BP (!) 144/92 (BP Location: Right Arm)   Pulse 90   Temp 98.7 F (37.1 C) (Oral)   Resp 18   Ht 5\' 3"  (1.6 m)   Wt 79.4 kg   LMP 09/07/2019   SpO2 97%   BMI 31.01 kg/m   Intake/Output Summary (Last 24 hours) at 09/23/2019 1438 Last data filed at 09/23/2019 1003 Gross per 24 hour  Intake 480 ml  Output 2733 ml  Net -2253 ml   Weight change: 0.7 kg  Physical Exam: Gen: NAD, lying in bed HEENT: EOMI PERRL CVS: RRR no m/r/g Resp: cta  with decreased BS at bases Abd: +BS, soft, NT Ext: +2 edema in LUE R IJ TDC C/D/I  Imaging: No results found.  Labs: BMET Recent Labs  Lab 09/17/19 0646 09/18/19 0621 09/19/19 0502 09/20/19 0414 09/21/19 0456 09/22/19 0542 09/23/19 0724  NA 134* 132* 130* 131* 130* 129* 132*  K 3.9 4.4 4.0 4.0 3.7 4.0 3.7  CL 99 97* 96* 95* 97* 97* 97*  CO2 22 22 23  21* 21* 21* 26  GLUCOSE 137* 121* 132* 136* 100* 89 123*  BUN 53* 79* 69* 94* 77* 93* 41*  CREATININE 4.77* 6.29* 5.79* 7.31* 6.86* 8.39* 4.54*  CALCIUM 7.2* 7.1* 7.0* 7.4* 7.6* 7.8* 7.9*  PHOS  --  7.6* 5.6*  --   --   --   --    CBC Recent Labs  Lab 09/17/19 0042  09/20/19 0414 09/21/19 0456 09/22/19 0542 09/23/19 0724  WBC 14.8*   < > 14.3* 18.2* 19.9* 20.2*  NEUTROABS 13.8*  --   --   --   --   --   HGB 11.7*   < > 10.6* 10.9* 10.6* 9.1*  HCT 34.6*   < > 31.6* 31.3* 31.1* 26.5*  MCV 91.1   < > 90.0 88.4 88.9 89.2  PLT 176   < > 160 180 164 171   < > = values in this interval not displayed.    Medications:    .  amLODipine  10 mg Oral Daily  . aspirin EC  81 mg Oral Daily  . atorvastatin  20 mg Oral q1800  . Chlorhexidine Gluconate Cloth  6 each Topical Once   And  . Chlorhexidine Gluconate Cloth  6 each Topical Once  . Chlorhexidine Gluconate Cloth  6 each Topical Q0600  . Chlorhexidine Gluconate Cloth  6 each Topical Q0600  . Chlorhexidine Gluconate Cloth  6 each Topical Q0600  . folic acid  1 mg Oral Daily  . insulin aspart  0-5 Units Subcutaneous QHS  . insulin aspart  0-9 Units Subcutaneous TID WC  . levothyroxine  50 mcg Oral Q0600  . multivitamin with minerals  1 tablet Oral Daily  . nicotine  14 mg Transdermal Daily  . pantoprazole  40 mg Oral Daily  . predniSONE  100 mg Oral Q breakfast  . sodium chloride flush  3 mL Intravenous Q12H  . thiamine  100 mg Oral Daily   Or  . thiamine  100 mg Intravenous Daily      Otelia Santee, MD 09/23/2019, 2:39 PM

## 2019-09-23 NOTE — Progress Notes (Signed)
PROGRESS NOTE    Alicia Flowers  [id number] DOB: 10/30/1971 DOA: 09/14/2019 PCP: Patient, No Pcp Per   Brief Narrative:  Per HPI: 48 y.o.femalewith a medical history significant for tobacco abuse, alcohol abuse, cocaine abuse and marijuana use;who presented to the hospital secondary to worsening numbness in her left upper extremity and left leg. Patient reports his symptoms have been present for approximately 3 months but worsening in the last 2 to 3 days prior to admission. Patient also expressed feeling slightly more tired and having decrease oral intake.No chest pain, no nausea, no vomiting, no productive cough, no sick contacts. Patient reports to continue smoking about half pack per day and is drinking on daily basis. Last use of marijuana was the night prior to admission.  In the ED patient was found to be mildly hypoxic, with elevated WBCs, acute renal failure with a creatinine close to 5, hyperkalemic, hypophosphatemic with a normal radiologic studies suggesting community-acquired pneumonia. She also has a positive MRI demonstrating right occipital stroke. Cultures were taken, sodium bicarbonate, insulin and calcium gluconate provided for hyperkalemia.Patient is started on IV antibiotics and [facility] has been contacted to admit patient for further evaluation and management.  9/22: Patient continues to have left arm swelling and pain. And was due to have tunneled catheter placement for hemodialysis tomorrow. Unfortunately, blood toxicology results will take approximately 5 days to return and anesthesia unwilling to place patient under sedation for this procedure without appropriate toxicology. Urine drug screen is being attempted if patient will make enough urine to check. Appreciate general surgery who may have to place IJ in the interim for dialysis as femoral line cannot stay in place for longer.  9/23:Patient continues to have ongoing left arm swelling and pain, but  CK levels are downtrending. Plans are for hemodialysis today and blood pressures are elevated, therefore amlodipine added by nephrology. Plans to remove femoral line catheter after hemodialysis today with placement of tunneled catheter in a.m. by IR at [facility] due to ongoing cocaine positivity and urine drug screen.  9/24:Patient has undergone tunneled catheter placement with no complications noted. Plans for hemodialysis in a.m. She continues to have elevated blood pressure readings as well as left arm swelling and pain, but CK levels are still downtrending.  9/25: Patient doing well this morning, but still continues to have left arm pain and swelling.  Blood pressure readings have improved and plans are to perform hemodialysis today.  She is being set up as an acute dialysis outpatient at the moment and remains on prednisone.  9/26: Patient doing well this morning and continues to have left arm tenderness.  She tolerated hemodialysis on 9/25 with 2 L removed.  CK levels downtrending.  She is hopeful that his urine output appears to be improving.   Assessment & Plan:   Principal Problem:   CAP (community acquired pneumonia) Active Problems:   Acute renal failure (ARF) (HCC)   Hypoxia   Alcohol abuse   Cocaine abuse (HCC)   Hyperkalemia   Metabolic acidosis   Hyperphosphatemia   Elevated troponin   Oliguria and anuria   1-hypoxia/community-acquired pneumonia-resolved -Afebrile -WBCs were slowly trending down; anticipated confusion information after initiation of steroids as recommended by neurology service. -Reports no chest pain -Continue flutter valve, good oxygen saturation on room air currently.  -Completed 5-day course of antibiotics with Rocephin  2-acute renal failure-anuric in setting of rhabdomyolysis -Continue avoiding nephrotoxic agents -Creatinine down after HD; still without urine output.  Plans for further hemodialysis  today. -Femoral catheterdiscontinued  on 9/24 -Tunneled catheter placed on 9/24 -Continue supportive care. -Follow renal function trend. -Renal ultrasound without obstructive uropathy. -CSW working on acute hemodialysis placement  3-history of alcohol abuse-no active withdrawal appreciated -Continue CIWA protocol -Continue thiamine and folic acid.  4-tobacco abuse, cocaine abuse/marijuana use -Cessation counseling has been provided -Continue nicotine patch  5-right occipital stroke/concerns for myositis  -Patient presenting with left numbness affecting left side -Abnormal electrolytes and renal failure slightly contributed to left side weakness/numbness. -Follow results from dialysis trial -Continue electrolytes repletion as needed -Followin neurology service recommendations patient has been started on IV steroids. Outpatient and electromyography recommended. -Will continue aspirin for secondary prevention and also statins -LDL of 80 and HDL 31; triglycerides level 241. -A1c 5.3. -Normal B12 level and elevated TSH. -CK level trending down  6-elevated TSH/hypothyroidism -Started on Synthroid and will recommend repeat thyroid profile in 4-6 weeks.  7-mixed hyperlipidemia -Hold Lipitor for now given rhabdomyolysis/elevated LFTs -Rhabdomyolysis improving  8-elevated troponin -No chest pain -No acute ischemic changes appreciated on telemetry or EKG -Reassuring 2D echo with normal ejection fraction and no wall motion abnormalities. -Patient's elevated troponin most likely associated with ongoing rhabdomyolysis and renal failure. -Continue statins and aspirin. -no further acute inpatient cardiac work up.  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 -Alcohol cessation counseling has been provided. -Trend with repeat CMP  in a.m.  10-rhabdomyolysis likely secondary to cocaine use/alcohol use versus necrotizing myopathy-downtrending -Repeat toxicology pending -Plans for tunnel catheter placement in a.m. -Continue oral prednisone 100 mg daily, and will clarify with neurology length of treatment and tapering protocol with follow-up outpatient.  11-polysubstance abuse -Repeat urine toxicology positive for cocaine and marijuana   DVT prophylaxis:heparin  Code Status:Full code. Family Communication:Discussed with mother 9/24 Disposition Plan:Continue close monitoring.Repeat labs in a.m.  plans for hemodialysis today and repeat PT evaluation.  Consultants:  Nephrology service  Neurology  Cardiology service has been curbside (Dr. Bronson Ing)  General Surgery  Procedures:  See below for x-ray reports.  Antimicrobials:   None  Subjective: Patient seen and evaluated today with no new acute complaints or concerns. No acute concerns or events noted overnight.  She continues to have left arm pain, but this is slowly improving.  She tolerated hemodialysis with 2 L removed yesterday.  She states that she is hopeful as she is having some more urine output now.  Objective: Vitals:   09/22/19 2230 09/22/19 2300 09/22/19 2311 09/23/19 0507  BP: (!) 153/86 (!) 147/80 140/81 (!) 144/92  Pulse: 98 98 91 90  Resp:   18   Temp:   98.1 F (36.7 C) 98.7 F (37.1 C)  TempSrc:   Oral Oral  SpO2:    97%  Weight:    79.4 kg  Height:        Intake/Output Summary (Last 24 hours) at 09/23/2019 1215 Last data filed at 09/23/2019 1003 Gross per 24 hour  Intake 720 ml  Output 2983 ml  Net -2263 ml   Filed Weights   09/22/19 0615 09/22/19 1845 09/23/19 0507  Weight: 80.8 kg 81.5 kg 79.4 kg    Examination:  General exam: Appears calm and comfortable  Respiratory system: Clear to auscultation. Respiratory effort normal. Cardiovascular system: S1 & S2 heard, RRR. No JVD, murmurs, rubs,  gallops or clicks. No pedal edema. Gastrointestinal system: Abdomen is  nondistended, soft and nontender. No organomegaly or masses felt. Normal bowel sounds heard. Central nervous system: Alert and oriented. No focal neurological deficits. Extremities: Symmetric 5 x 5 power.  Left arm swelling and tenderness present. Skin: No rashes, lesions or ulcers Psychiatry: Judgement and insight appear normal. Mood & affect appropriate.     Data Reviewed: I have personally reviewed following labs and imaging studies  CBC: Recent Labs  Lab 09/17/19 0042  09/19/19 0502 09/20/19 0414 09/21/19 0456 09/22/19 0542 09/23/19 0724  WBC 14.8*   < > 12.0* 14.3* 18.2* 19.9* 20.2*  NEUTROABS 13.8*  --   --   --   --   --   --   HGB 11.7*   < > 9.9* 10.6* 10.9* 10.6* 9.1*  HCT 34.6*   < > 28.3* 31.6* 31.3* 31.1* 26.5*  MCV 91.1   < > 87.9 90.0 88.4 88.9 89.2  PLT 176   < > 147* 160 180 164 171   < > = values in this interval not displayed.   Basic Metabolic Panel: Recent Labs  Lab 09/18/19 0621 09/19/19 0502 09/20/19 0414 09/21/19 0456 09/22/19 0542 09/23/19 0724  NA 132* 130* 131* 130* 129* 132*  K 4.4 4.0 4.0 3.7 4.0 3.7  CL 97* 96* 95* 97* 97* 97*  CO2 22 23 21* 21* 21* 26  GLUCOSE 121* 132* 136* 100* 89 123*  BUN 79* 69* 94* 77* 93* 41*  CREATININE 6.29* 5.79* 7.31* 6.86* 8.39* 4.54*  CALCIUM 7.1* 7.0* 7.4* 7.6* 7.8* 7.9*  PHOS 7.6* 5.6*  --   --   --   --    GFR: Estimated Creatinine Clearance: 15.3 mL/min (A) (by C-G formula based on SCr of 4.54 mg/dL (H)). Liver Function Tests: Recent Labs  Lab 09/17/19 0646  09/19/19 0502 09/20/19 0414 09/21/19 0456 09/22/19 0542 09/23/19 0724  AST 431*  --   --  147* 85* 53* 42*  ALT 393*  --   --  266* 209* 135* 89*  ALKPHOS 84  --   --  66 59 61 52  BILITOT 0.5  --   --  0.8 0.8 0.8 0.9  PROT 5.7*  --   --  5.5* 5.3* 5.0* 4.9*  ALBUMIN 2.5*   < > 2.5* 2.8* 2.6* 2.6* 2.5*   < > = values in this interval not displayed.   No results  for input(s): LIPASE, AMYLASE in the last 168 hours. No results for input(s): AMMONIA in the last 168 hours. Coagulation Profile: Recent Labs  Lab 09/20/19 0954 09/21/19 0456  INR 1.1 1.2   Cardiac Enzymes: Recent Labs  Lab 09/19/19 0502 09/20/19 0414 09/21/19 0456 09/22/19 0542 09/23/19 0724  CKTOTAL 12,683* 10,175* 5,115* 2,223* 1,075*   BNP (last 3 results) No results for input(s): PROBNP in the last 8760 hours. HbA1C: No results for input(s): HGBA1C in the last 72 hours. CBG: Recent Labs  Lab 09/22/19 1114 09/22/19 1554 09/22/19 2110 09/23/19 0734 09/23/19 1123  GLUCAP 138* 166* 182* 114* 152*   Lipid Profile: No results for input(s): CHOL, HDL, LDLCALC, TRIG, CHOLHDL, LDLDIRECT in the last 72 hours. Thyroid Function Tests: No results for input(s): TSH, T4TOTAL, FREET4, T3FREE, THYROIDAB in the last 72 hours. Anemia Panel: No results for input(s): VITAMINB12, FOLATE, FERRITIN, TIBC, IRON, RETICCTPCT in the last 72 hours. Sepsis Labs: No results for input(s): PROCALCITON, LATICACIDVEN in the last 168 hours.  Recent Results (from the past 240 hour(s))  Urine culture     Status:  Abnormal   Collection Time: 09/14/19  8:41 AM   Specimen: Urine, Random  Result Value Ref Range Status   Specimen Description   Final    URINE, RANDOM Performed at [facility], [address], [place], [place] [address]    Special Requests   Final    NONE Performed at [facility], [address], [place], [place] [address]    Culture (A)  Final    30,000 COLONIES/mL GROUP B STREP(S.AGALACTIAE)ISOLATED TESTING AGAINST S. AGALACTIAE NOT ROUTINELY PERFORMED DUE TO PREDICTABILITY OF AMP/PEN/VAN SUSCEPTIBILITY. Performed at [facility], [place] [address], [place], [place] [address]    Report Status 09/16/2019 FINAL  Final  SARS Coronavirus 2 [facility] order, Performed in [facility] hospital lab) Nasopharyngeal Nasopharyngeal Swab     Status: None   Collection Time: 09/14/19 10:04  AM   Specimen: Nasopharyngeal Swab  Result Value Ref Range Status   SARS Coronavirus 2 NEGATIVE NEGATIVE Final    Comment: (NOTE) If result is NEGATIVE SARS-CoV-2 target nucleic acids are NOT DETECTED. The SARS-CoV-2 RNA is generally detectable in upper and lower  respiratory specimens during the acute phase of infection. The lowest  concentration of SARS-CoV-2 viral copies this assay can detect is 250  copies / mL. A negative result does not preclude SARS-CoV-2 infection  and should not be used as the sole basis for treatment or other  patient management decisions.  A negative result may occur with  improper specimen collection / handling, submission of specimen other  than nasopharyngeal swab, presence of viral mutation(s) within the  areas targeted by this assay, and inadequate number of viral copies  (<250 copies / mL). A negative result must be combined with clinical  observations, patient history, and epidemiological information. If result is POSITIVE SARS-CoV-2 target nucleic acids are DETECTED. The SARS-CoV-2 RNA is generally detectable in upper and lower  respiratory specimens dur ing the acute phase of infection.  Positive  results are indicative of active infection with SARS-CoV-2.  Clinical  correlation with patient history and other diagnostic information is  necessary to determine patient infection status.  Positive results do  not rule out bacterial infection or co-infection with other viruses. If result is PRESUMPTIVE POSTIVE SARS-CoV-2 nucleic acids MAY BE PRESENT.   A presumptive positive result was obtained on the submitted specimen  and confirmed on repeat testing.  While 2019 novel coronavirus  (SARS-CoV-2) nucleic acids may be present in the submitted sample  additional confirmatory testing may be necessary for epidemiological  and / or clinical management purposes  to differentiate between  SARS-CoV-2 and other Sarbecovirus currently known to infect humans.   If clinically indicated additional testing with an alternate test  methodology [phone]) is advised. The SARS-CoV-2 RNA is generally  detectable in upper and lower respiratory sp ecimens during the acute  phase of infection. The expected result is Negative. Fact Sheet for Patients:  StrictlyIdeas.no Fact Sheet for Healthcare Providers: BankingDealers.co.za This test is not yet approved or cleared by the [place] FDA and has been authorized for detection and/or diagnosis of SARS-CoV-2 by FDA under an Emergency Use Authorization (EUA).  This EUA will remain in effect (meaning this test can be used) for the duration of the COVID-19 declaration under Section 564(b)(1) of the Act, 21 U.S.C. section 360bbb-3(b)(1), unless the authorization is terminated or revoked sooner. Performed at [facility], [address], [place], [place] [address]   MRSA PCR Screening     Status: None   Collection  Time: 09/15/19  8:45 AM   Specimen: Nasal Mucosa; Nasopharyngeal  Result Value Ref Range Status   MRSA by PCR NEGATIVE NEGATIVE Final    Comment:        The GeneXpert MRSA Assay (FDA approved for NASAL specimens only), is one component of a comprehensive MRSA colonization surveillance program. It is not intended to diagnose MRSA infection nor to guide or monitor treatment for MRSA infections. Performed at [facility], [address], [place], [place] [address]          Radiology Studies: No results found.      Scheduled Meds: . amLODipine  10 mg Oral Daily  . aspirin EC  81 mg Oral Daily  . atorvastatin  20 mg Oral q1800  . Chlorhexidine Gluconate Cloth  6 each Topical Once   And  . Chlorhexidine Gluconate Cloth  6 each Topical Once  . Chlorhexidine Gluconate Cloth  6 each Topical Q0600  . Chlorhexidine Gluconate Cloth  6 each Topical Q0600  . Chlorhexidine Gluconate Cloth  6 each Topical Q0600  . folic acid  1 mg Oral Daily  .  insulin aspart  0-5 Units Subcutaneous QHS  . insulin aspart  0-9 Units Subcutaneous TID WC  . levothyroxine  50 mcg Oral Q0600  . multivitamin with minerals  1 tablet Oral Daily  . nicotine  14 mg Transdermal Daily  . pantoprazole  40 mg Oral Daily  . predniSONE  100 mg Oral Q breakfast  . sodium chloride flush  3 mL Intravenous Q12H  . thiamine  100 mg Oral Daily   Or  . thiamine  100 mg Intravenous Daily   Continuous Infusions: . sodium chloride    . sodium chloride    . sodium chloride       LOS: 9 days    Time spent: 30 minutes    Zamia Tyminski Darleen Crocker, DO [facility] Pager [phone]  If 7PM-7AM, please contact night-coverage [email or web address] Password [facility] 09/23/2019, 12:15 PM

## 2019-09-24 LAB — COMPREHENSIVE METABOLIC PANEL
ALT: 75 U/L — ABNORMAL HIGH (ref 0–44)
AST: 40 U/L (ref 15–41)
Albumin: 2.6 g/dL — ABNORMAL LOW (ref 3.5–5.0)
Alkaline Phosphatase: 57 U/L (ref 38–126)
Anion gap: 10 (ref 5–15)
BUN: 64 mg/dL — ABNORMAL HIGH (ref 6–20)
CO2: 27 mmol/L (ref 22–32)
Calcium: 8.2 mg/dL — ABNORMAL LOW (ref 8.9–10.3)
Chloride: 96 mmol/L — ABNORMAL LOW (ref 98–111)
Creatinine, Ser: 5.55 mg/dL — ABNORMAL HIGH (ref 0.44–1.00)
GFR calc Af Amer: 10 mL/min — ABNORMAL LOW (ref >60)
GFR calc non Af Amer: 8 mL/min — ABNORMAL LOW (ref >60)
Glucose, Bld: 125 mg/dL — ABNORMAL HIGH (ref 70–99)
Potassium: 4.2 mmol/L (ref 3.5–5.1)
Sodium: 133 mmol/L — ABNORMAL LOW (ref 135–145)
Total Bilirubin: 0.8 mg/dL (ref 0.3–1.2)
Total Protein: 5 g/dL — ABNORMAL LOW (ref 6.5–8.1)

## 2019-09-24 LAB — CBC
HCT: 25.9 % — ABNORMAL LOW (ref 36.0–46.0)
Hemoglobin: 8.6 g/dL — ABNORMAL LOW (ref 12.0–15.0)
MCH: 30.2 pg (ref 26.0–34.0)
MCHC: 33.2 g/dL (ref 30.0–36.0)
MCV: 90.9 fL (ref 80.0–100.0)
Platelets: 217 10*3/uL (ref 150–400)
RBC: 2.85 MIL/uL — ABNORMAL LOW (ref 3.87–5.11)
RDW: 15.8 % — ABNORMAL HIGH (ref 11.5–15.5)
WBC: 24.9 10*3/uL — ABNORMAL HIGH (ref 4.0–10.5)
nRBC: 0 % (ref 0.0–0.2)

## 2019-09-24 LAB — GLUCOSE, CAPILLARY
Glucose-Capillary: 114 mg/dL — ABNORMAL HIGH (ref 70–99)
Glucose-Capillary: 198 mg/dL — ABNORMAL HIGH (ref 70–99)
Glucose-Capillary: 174 mg/dL — ABNORMAL HIGH (ref 70–99)
Glucose-Capillary: 135 mg/dL — ABNORMAL HIGH (ref 70–99)

## 2019-09-24 LAB — CK: Total CK: 659 U/L — ABNORMAL HIGH (ref 38–234)

## 2019-09-24 MED ORDER — TRAMADOL HCL 50 MG PO TABS
50.0000 mg | ORAL_TABLET | Freq: Once | ORAL | Status: AC
  Administered 2019-09-24: 03:00:00 50 mg via ORAL

## 2019-09-24 MED ORDER — TRAMADOL HCL 50 MG PO TABS: 50.0000 mg | ORAL_TABLET | Freq: Once | ORAL | Status: DC

## 2019-09-24 NOTE — Progress Notes (Signed)
PROGRESS NOTE    Alicia Flowers  [id number] DOB: 03/11/1971 DOA: 09/14/2019 PCP: Patient, No Pcp Per   Brief Narrative:  Per HPI: 48 y.o.femalewith a medical history significant for tobacco abuse, alcohol abuse, cocaine abuse and marijuana use;who presented to the hospital secondary to worsening numbness in her left upper extremity and left leg. Patient reports his symptoms have been present for approximately 3 months but worsening in the last 2 to 3 days prior to admission. Patient also expressed feeling slightly more tired and having decrease oral intake.No chest pain, no nausea, no vomiting, no productive cough, no sick contacts. Patient reports to continue smoking about half pack per day and is drinking on daily basis. Last use of marijuana was the night prior to admission.  In the ED patient was found to be mildly hypoxic, with elevated WBCs, acute renal failure with a creatinine close to 5, hyperkalemic, hypophosphatemic with a normal radiologic studies suggesting community-acquired pneumonia. She also has a positive MRI demonstrating right occipital stroke. Cultures were taken, sodium bicarbonate, insulin and calcium gluconate provided for hyperkalemia.Patient is started on IV antibiotics and [facility] has been contacted to admit patient for further evaluation and management.  9/22: Patient continues to have left arm swelling and pain. And was due to have tunneled catheter placement for hemodialysis tomorrow. Unfortunately, blood toxicology results will take approximately 5 days to return and anesthesia unwilling to place patient under sedation for this procedure without appropriate toxicology. Urine drug screen is being attempted if patient will make enough urine to check. Appreciate general surgery who may have to place IJ in the interim for dialysis as femoral line cannot stay in place for longer.  9/23:Patient continues to have ongoing left arm swelling and pain, but  CK levels are downtrending. Plans are for hemodialysis today and blood pressures are elevated, therefore amlodipine added by nephrology. Plans to remove femoral line catheter after hemodialysis today with placement of tunneled catheter in a.m. by IR at [facility] due to ongoing cocaine positivity and urine drug screen.  9/24:Patient has undergone tunneled catheter placement with no complications noted. Plans for hemodialysis in a.m. She continues to have elevated blood pressure readings as well as left arm swelling and pain, but CK levels are still downtrending.  9/25: Patient doing well this morning, but still continues to have left arm pain and swelling. Blood pressure readings have improved and plans are to perform hemodialysis today. She is being set up as an acute dialysis outpatient at the moment and remains on prednisone.  9/26: Patient doing well this morning and continues to have left arm tenderness.  She tolerated hemodialysis on 9/25 with 2 L removed.  CK levels downtrending.  She is hopeful that his urine output appears to be improving.  9/27: Patient appears to be doing well this morning and had 1.5 L of urine output yesterday.  Laboratory data stable but she is developing some leukocytosis likely related to steroid use.  Nephrology following.  Assessment & Plan:   Principal Problem:   CAP (community acquired pneumonia) Active Problems:   Acute renal failure (ARF) (HCC)   Hypoxia   Alcohol abuse   Cocaine abuse (HCC)   Hyperkalemia   Metabolic acidosis   Hyperphosphatemia   Elevated troponin   Oliguria and anuria   1-hypoxia/community-acquired pneumonia-resolved -Afebrile -WBCs were slowly trending down; anticipated confusion information after initiation of steroids as recommended by neurology service. -Reports no chest pain -Continue flutter valve, good oxygen saturation on room air currently.  -  Completed 5-day course of antibiotics with Rocephin  2-acute  renal failure-anuric in setting of rhabdomyolysis, now improving -Continue avoiding nephrotoxic agents -Creatinine down after HD; still without urine output.Plans for further hemodialysis today. -Femoral catheterdiscontinuedon 9/24 -Tunneled catheter placed on 9/24 -Continue supportive care. -Follow renal function trend. -Renal ultrasound without obstructive uropathy. -CSW working on acute hemodialysis placement if needed  3-history of alcohol abuse-no active withdrawal appreciated -Continue CIWA protocol -Continue thiamine and folic acid.  4-tobacco abuse, cocaine abuse/marijuana use -Cessation counseling has been provided -Continue nicotine patch  5-right occipital stroke/concerns for myositis  -Patient presenting with left numbness affecting left side -Abnormal electrolytes and renal failure slightly contributed to left side weakness/numbness. -Follow results from dialysis trial -Continue electrolytes repletion as needed -Followin neurology service recommendations patient has been started on IV steroids. Outpatient and electromyography recommended. -Will continue aspirin for secondary prevention and also statins -LDL of 80 and HDL 31; triglycerides level 241. -A1c 5.3. -Normal B12 level and elevated TSH. -CK level trending down  6-elevated TSH/hypothyroidism -Started on Synthroid and will recommend repeat thyroid profile in 4-6 weeks.  7-mixed hyperlipidemia -Hold Lipitor for now given rhabdomyolysis/elevated LFTs -Rhabdomyolysis improving  8-elevated troponin -No chest pain -No acute ischemic changes appreciated on telemetry or EKG -Reassuring 2D echo with normal ejection fraction and no wall motion abnormalities. -Patient's elevated troponin most likely associated with ongoing rhabdomyolysis and renal failure. -Continue statins and aspirin. -no further acute inpatient cardiac work up.  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 -Alcohol cessation counseling has been provided. -Trend with repeatCMP in a.m.  10-rhabdomyolysis likely secondary to cocaine use/alcohol use versus necrotizing myopathy-downtrending -Repeat toxicology pending -Plans for tunnel catheter placement in a.m. -Continue oral prednisone 100 mg daily, and will clarify with neurology length of treatment and tapering protocol with follow-up outpatient.  11-polysubstance abuse -Repeat urine toxicology positive for cocaine and marijuana  12-worsening leukocytosis -This does not appear to be infectious and likely related to ongoing steroid use -We will monitor trend with repeat CBC   DVT prophylaxis:heparin  Code Status:Full code. Family Communication:Discussed with mother 9/24 Disposition Plan:Continue close monitoring.Repeat labs in a.m.plans for repeat hemodialysis in a.m. as needed by nephrology.  Urine output is increasing.  We will plan for disposition in the next 1 to 2 days.  Consultants:  Nephrology service  Neurology  Cardiology service has been curbside (Dr. Bronson Ing)  General Surgery  Procedures:  See below for x-ray reports.  Antimicrobials:   None  Subjective: Patient seen and evaluated today with no new acute complaints or concerns. No acute concerns or events noted overnight.  Her arm started to feel better and she is having increased urine output.   Objective: Vitals:   09/23/19 0507 09/23/19 1516 09/23/19 2130 09/24/19 0617  BP: (!) 144/92 133/90 (!) 161/89 (!) 153/84  Pulse: 90 87 92 84  Resp:  20 20   Temp: 98.7 F (37.1 C) 98.1 F (36.7 C) 98.9 F (37.2 C) 98.4 F (36.9 C)  TempSrc: Oral Oral Oral Oral  SpO2: 97% 100% 100% 100%  Weight: 79.4 kg   81 kg  Height:        Intake/Output Summary (Last 24 hours) at  09/24/2019 1244 Last data filed at 09/24/2019 0700 Gross per 24 hour  Intake 720 ml  Output 1350 ml  Net -630 ml   [employer]  09/22/19 1845 09/23/19 0507 09/24/19 0617  Weight: 81.5 kg 79.4 kg 81 kg    Examination:  General exam: Appears calm and comfortable  Respiratory system: Clear to auscultation. Respiratory effort normal. Cardiovascular system: S1 & S2 heard, RRR. No JVD, murmurs, rubs, gallops or clicks. No pedal edema. Gastrointestinal system: Abdomen is nondistended, soft and nontender. No organomegaly or masses felt. Normal bowel sounds heard. Central nervous system: Alert and oriented. No focal neurological deficits. Extremities: Symmetric 5 x 5 power.  Left arm with minimal swelling and tenderness to palpation. Skin: No rashes, lesions or ulcers Psychiatry: Judgement and insight appear normal. Mood & affect appropriate.     Data Reviewed: I have personally reviewed following labs and imaging studies  CBC: Recent Labs  Lab 09/20/19 0414 09/21/19 0456 09/22/19 0542 09/23/19 0724 09/24/19 0659  WBC 14.3* 18.2* 19.9* 20.2* 24.9*  HGB 10.6* 10.9* 10.6* 9.1* 8.6*  HCT 31.6* 31.3* 31.1* 26.5* 25.9*  MCV 90.0 88.4 88.9 89.2 90.9  PLT 160 180 164 171 [id number]   Basic Metabolic Panel: Recent Labs  Lab 09/18/19 0621 09/19/19 0502 09/20/19 0414 09/21/19 0456 09/22/19 0542 09/23/19 0724 09/24/19 0659  NA 132* 130* 131* 130* 129* 132* 133*  K 4.4 4.0 4.0 3.7 4.0 3.7 4.2  CL 97* 96* 95* 97* 97* 97* 96*  CO2 22 23 21* 21* 21* 26 27  GLUCOSE 121* 132* 136* 100* 89 123* 125*  BUN 79* 69* 94* 77* 93* 41* 64*  CREATININE 6.29* 5.79* 7.31* 6.86* 8.39* 4.54* 5.55*  CALCIUM 7.1* 7.0* 7.4* 7.6* 7.8* 7.9* 8.2*  PHOS 7.6* 5.6*  --   --   --   --   --    GFR: Estimated Creatinine Clearance: 12.6 mL/min (A) (by C-G formula based on SCr of 5.55 mg/dL (H)). Liver Function Tests: Recent Labs  Lab 09/20/19 0414 09/21/19 0456 09/22/19 0542 09/23/19 0724 09/24/19 0659   AST 147* 85* 53* 42* 40  ALT 266* 209* 135* 89* 75*  ALKPHOS 66 59 61 52 57  BILITOT 0.8 0.8 0.8 0.9 0.8  PROT 5.5* 5.3* 5.0* 4.9* 5.0*  ALBUMIN 2.8* 2.6* 2.6* 2.5* 2.6*   No results for input(s): LIPASE, AMYLASE in the last 168 hours. No results for input(s): AMMONIA in the last 168 hours. Coagulation Profile: Recent Labs  Lab 09/20/19 0954 09/21/19 0456  INR 1.1 1.2   Cardiac Enzymes: Recent Labs  Lab 09/20/19 0414 09/21/19 0456 09/22/19 0542 09/23/19 0724 09/24/19 0659  CKTOTAL 10,175* 5,115* 2,223* 1,075* 659*   BNP (last 3 results) No results for input(s): PROBNP in the last 8760 hours. HbA1C: No results for input(s): HGBA1C in the last 72 hours. CBG: Recent Labs  Lab 09/23/19 1123 09/23/19 1644 09/23/19 2132 09/24/19 0743 09/24/19 1118  GLUCAP 152* 129* 122* 114* 198*   Lipid Profile: No results for input(s): CHOL, HDL, LDLCALC, TRIG, CHOLHDL, LDLDIRECT in the last 72 hours. Thyroid Function Tests: No results for input(s): TSH, T4TOTAL, FREET4, T3FREE, THYROIDAB in the last 72 hours. Anemia Panel: No results for input(s): VITAMINB12, FOLATE, FERRITIN, TIBC, IRON, RETICCTPCT in the last 72 hours. Sepsis Labs: No results for input(s): PROCALCITON, LATICACIDVEN in the last 168 hours.  Recent Results (from the past 240 hour(s))  MRSA PCR Screening     Status: None   Collection Time: 09/15/19  8:45 AM   Specimen: Nasal Mucosa; Nasopharyngeal  Result Value Ref Range Status   MRSA by PCR NEGATIVE NEGATIVE Final    Comment:  The GeneXpert MRSA Assay (FDA approved for NASAL specimens only), is one component of a comprehensive MRSA colonization surveillance program. It is not intended to diagnose MRSA infection nor to guide or monitor treatment for MRSA infections. Performed at [facility], [address], [place], [place] [address]          Radiology Studies: No results found.      Scheduled Meds: . amLODipine  10 mg Oral Daily  .  aspirin EC  81 mg Oral Daily  . atorvastatin  20 mg Oral q1800  . Chlorhexidine Gluconate Cloth  6 each Topical Once   And  . Chlorhexidine Gluconate Cloth  6 each Topical Once  . Chlorhexidine Gluconate Cloth  6 each Topical Q0600  . folic acid  1 mg Oral Daily  . insulin aspart  0-5 Units Subcutaneous QHS  . insulin aspart  0-9 Units Subcutaneous TID WC  . levothyroxine  50 mcg Oral Q0600  . multivitamin with minerals  1 tablet Oral Daily  . nicotine  14 mg Transdermal Daily  . pantoprazole  40 mg Oral Daily  . predniSONE  100 mg Oral Q breakfast  . sodium chloride flush  3 mL Intravenous Q12H  . thiamine  100 mg Oral Daily   Or  . thiamine  100 mg Intravenous Daily   Continuous Infusions: . sodium chloride    . sodium chloride    . sodium chloride       LOS: 10 days    Time spent: 30 minutes     Darleen Crocker, DO [facility] Pager [phone]  If 7PM-7AM, please contact night-coverage [email or web address] Password [facility] 09/24/2019, 12:44 PM

## 2019-09-24 NOTE — Progress Notes (Signed)
[facility] Progress Note    Assessment/ Plan:   1. Anuric AKI- in setting of rhabdomyolysis presumably due to cocaine and alcohol use. No history of loss of consciousness but unclear. Renal US without obstruction. Acute GN workup w/o sig positive findings. ANA +, complements WNL, antiDNA antibody negative, ASO titer, ANCA, and anti GBM normal.  - UOP picking up -> let's hold HD tomorrow; she may be recovering renal function. 2. Rhabdomyolysis- presumably from cocaine/alcohol and possible LOC. Follow CK levels. Remain elevated but slowly improving; neuro treating as inflammatory myositis on pred 3. Right occipital infarct- neuro 4. CAP/sepsis- on antibiotics per primary svc 5. Hyperkalemia- resolved  6. Left arm weakness- possibly related to right cva vs rhabdo, neurofollowing 7. Polysubstance abuse- SW consulted. Will likely need withdrawal protocol as she admits to drinking daily 8. Abnormal EKG with new partial RBBB- per [facility] Likely cocaine related.  9. Progressive elevation of blood pressures, hypervolemia 10. AKI CLIP eval in process; may be able to hold off as she could be recovering renal function. - UOP markedly improved; holding  HD Monday as there are significant signs of recovery  Subjective:   Main complaint is pain from the left arm swelling. Denies f/c/n/v. UOP much improved.   Objective:   BP (!) 153/84   Pulse 84   Temp 98.4 F (36.9 C) (Oral)   Resp 20   Ht 5\' 3"  (1.6 m)   Wt 81 kg   LMP 09/07/2019   SpO2 100%   BMI 31.63 kg/m   Intake/Output Summary (Last 24 hours) at 09/24/2019 1335 Last data filed at 09/24/2019 0700 Gross per 24 hour  Intake 720 ml  Output 1350 ml  Net -630 ml   Weight change: -0.5 kg  Physical Exam: Gen: NAD, lying in bed HEENT: EOMI PERRL CVS: RRR no m/r/g Resp: cta with decreased BS at bases Abd: +BS, soft, NT Ext: +2 edema in LUE R IJ TDC C/D/I   Imaging: No results found.  Labs: BMET Recent  Labs  Lab 09/18/19 [id number] 09/19/19 0502 09/20/19 0414 09/21/19 0456 09/22/19 0542 09/23/19 0724 09/24/19 0659  NA 132* 130* 131* 130* 129* 132* 133*  K 4.4 4.0 4.0 3.7 4.0 3.7 4.2  CL 97* 96* 95* 97* 97* 97* 96*  CO2 22 23 21* 21* 21* 26 27  GLUCOSE 121* 132* 136* 100* 89 123* 125*  BUN 79* 69* 94* 77* 93* 41* 64*  CREATININE 6.29* 5.79* 7.31* 6.86* 8.39* 4.54* 5.55*  CALCIUM 7.1* 7.0* 7.4* 7.6* 7.8* 7.9* 8.2*  PHOS 7.6* 5.6*  --   --   --   --   --    CBC Recent Labs  Lab 09/21/19 0456 09/22/19 0542 09/23/19 0724 09/24/19 0659  WBC 18.2* 19.9* 20.2* 24.9*  HGB 10.9* 10.6* 9.1* 8.6*  HCT 31.3* 31.1* 26.5* 25.9*  MCV 88.4 88.9 89.2 90.9  PLT 180 164 171 217    Medications:    . amLODipine  10 mg Oral Daily  . aspirin EC  81 mg Oral Daily  . atorvastatin  20 mg Oral q1800  . Chlorhexidine Gluconate Cloth  6 each Topical Once   And  . Chlorhexidine Gluconate Cloth  6 each Topical Once  . Chlorhexidine Gluconate Cloth  6 each Topical Q0600  . folic acid  1 mg Oral Daily  . insulin aspart  0-5 Units Subcutaneous QHS  . insulin aspart  0-9 Units Subcutaneous TID WC  . levothyroxine  50 mcg Oral Q0600  .  multivitamin with minerals  1 tablet Oral Daily  . nicotine  14 mg Transdermal Daily  . pantoprazole  40 mg Oral Daily  . predniSONE  100 mg Oral Q breakfast  . sodium chloride flush  3 mL Intravenous Q12H  . thiamine  100 mg Oral Daily   Or  . thiamine  100 mg Intravenous Daily      Otelia Santee, MD 09/24/2019, 1:35 PM

## 2019-09-25 LAB — COMPREHENSIVE METABOLIC PANEL
ALT: 59 U/L — ABNORMAL HIGH (ref 0–44)
AST: 28 U/L (ref 15–41)
Albumin: 2.7 g/dL — ABNORMAL LOW (ref 3.5–5.0)
Alkaline Phosphatase: 57 U/L (ref 38–126)
Anion gap: 13 (ref 5–15)
BUN: 79 mg/dL — ABNORMAL HIGH (ref 6–20)
CO2: 22 mmol/L (ref 22–32)
Calcium: 8.3 mg/dL — ABNORMAL LOW (ref 8.9–10.3)
Chloride: 96 mmol/L — ABNORMAL LOW (ref 98–111)
Creatinine, Ser: 5.66 mg/dL — ABNORMAL HIGH (ref 0.44–1.00)
GFR calc Af Amer: 10 mL/min — ABNORMAL LOW (ref >60)
GFR calc non Af Amer: 8 mL/min — ABNORMAL LOW (ref >60)
Glucose, Bld: 120 mg/dL — ABNORMAL HIGH (ref 70–99)
Potassium: 3.7 mmol/L (ref 3.5–5.1)
Sodium: 131 mmol/L — ABNORMAL LOW (ref 135–145)
Total Bilirubin: 0.6 mg/dL (ref 0.3–1.2)
Total Protein: 5.1 g/dL — ABNORMAL LOW (ref 6.5–8.1)

## 2019-09-25 LAB — CBC
HCT: 26.5 % — ABNORMAL LOW (ref 36.0–46.0)
Hemoglobin: 8.9 g/dL — ABNORMAL LOW (ref 12.0–15.0)
MCH: 30.4 pg (ref 26.0–34.0)
MCHC: 33.6 g/dL (ref 30.0–36.0)
MCV: 90.4 fL (ref 80.0–100.0)
Platelets: 258 10*3/uL (ref 150–400)
RBC: 2.93 MIL/uL — ABNORMAL LOW (ref 3.87–5.11)
RDW: 15.9 % — ABNORMAL HIGH (ref 11.5–15.5)
WBC: 31.6 10*3/uL — ABNORMAL HIGH (ref 4.0–10.5)
nRBC: 0 % (ref 0.0–0.2)

## 2019-09-25 LAB — GLUCOSE, CAPILLARY
Glucose-Capillary: 120 mg/dL — ABNORMAL HIGH (ref 70–99)
Glucose-Capillary: 140 mg/dL — ABNORMAL HIGH (ref 70–99)
Glucose-Capillary: 156 mg/dL — ABNORMAL HIGH (ref 70–99)
Glucose-Capillary: 218 mg/dL — ABNORMAL HIGH (ref 70–99)

## 2019-09-25 LAB — CK: Total CK: 415 U/L — ABNORMAL HIGH (ref 38–234)

## 2019-09-25 NOTE — Progress Notes (Signed)
Patient ID: [name], female   DOB: 07/10/1971, 48 y.o.   MRN: [id number] S: No new complaints overnight.  Feeling a little better O:BP (!) 152/88   Pulse 82   Temp 98.3 F (36.8 C) (Oral)   Resp 18   Ht 5\' 3"  (1.6 m)   Wt 81 kg   LMP 09/07/2019   SpO2 99%   BMI 31.63 kg/m   Intake/Output Summary (Last 24 hours) at 09/25/2019 1000 Last data filed at 09/25/2019 0600 Gross per 24 hour  Intake 1160 ml  Output 2000 ml  Net -840 ml   Intake/Output: I/O last 3 completed shifts: In: 1160 [P.O.:1160] Out: 2650 [Urine:2650]  Intake/Output this shift:  No intake/output data recorded. Weight change:  Gen: NAD CVS: no rub Resp: CTA with decreased BS at bases Abd: +BS, soft, NT/ND Ext: + edema, greatest in LUE  Dialysis access:  RIJ tdc c/d/i, no erythema or drainage   Recent Labs  Lab 09/19/19 0502 09/20/19 0414 09/21/19 0456 09/22/19 0542 09/23/19 0724 09/24/19 0659 09/25/19 0624  NA 130* 131* 130* 129* 132* 133* 131*  K 4.0 4.0 3.7 4.0 3.7 4.2 3.7  CL 96* 95* 97* 97* 97* 96* 96*  CO2 23 21* 21* 21* 26 27 22   GLUCOSE 132* 136* 100* 89 123* 125* 120*  BUN 69* 94* 77* 93* 41* 64* 79*  CREATININE 5.79* 7.31* 6.86* 8.39* 4.54* 5.55* 5.66*  ALBUMIN 2.5* 2.8* 2.6* 2.6* 2.5* 2.6* 2.7*  CALCIUM 7.0* 7.4* 7.6* 7.8* 7.9* 8.2* 8.3*  PHOS 5.6*  --   --   --   --   --   --   AST  --  147* 85* 53* 42* 40 28  ALT  --  266* 209* 135* 89* 75* 59*   Liver Function Tests: Recent Labs  Lab 09/23/19 0724 09/24/19 0659 09/25/19 0624  AST 42* 40 28  ALT 89* 75* 59*  ALKPHOS 52 57 57  BILITOT 0.9 0.8 0.6  PROT 4.9* 5.0* 5.1*  ALBUMIN 2.5* 2.6* 2.7*   No results for input(s): LIPASE, AMYLASE in the last 168 hours. No results for input(s): AMMONIA in the last 168 hours. CBC: Recent Labs  Lab 09/21/19 0456 09/22/19 0542 09/23/19 0724 09/24/19 0659 09/25/19 0624  WBC 18.2* 19.9* 20.2* 24.9* 31.6*  HGB 10.9* 10.6* 9.1* 8.6* 8.9*  HCT 31.3* 31.1* 26.5* 25.9* 26.5*  MCV  88.4 88.9 89.2 90.9 90.4  PLT 180 164 171 217 258   Cardiac Enzymes: Recent Labs  Lab 09/21/19 0456 09/22/19 0542 09/23/19 0724 09/24/19 0659 09/25/19 0624  CKTOTAL 5,115* 2,223* 1,075* 659* 415*   CBG: Recent Labs  Lab 09/24/19 0743 09/24/19 1118 09/24/19 1611 09/24/19 2239 09/25/19 0835  GLUCAP 114* 198* 174* 135* 120*    Iron Studies: No results for input(s): IRON, TIBC, TRANSFERRIN, FERRITIN in the last 72 hours. Studies/Results: No results found. Marland Kitchen amLODipine  10 mg Oral Daily  . aspirin EC  81 mg Oral Daily  . atorvastatin  20 mg Oral q1800  . Chlorhexidine Gluconate Cloth  6 each Topical Once   And  . Chlorhexidine Gluconate Cloth  6 each Topical Once  . Chlorhexidine Gluconate Cloth  6 each Topical Q0600  . folic acid  1 mg Oral Daily  . insulin aspart  0-5 Units Subcutaneous QHS  . insulin aspart  0-9 Units Subcutaneous TID WC  . levothyroxine  50 mcg Oral Q0600  . multivitamin with minerals  1 tablet Oral Daily  .  nicotine  14 mg Transdermal Daily  . pantoprazole  40 mg Oral Daily  . predniSONE  100 mg Oral Q breakfast  . sodium chloride flush  3 mL Intravenous Q12H  . thiamine  100 mg Oral Daily   Or  . thiamine  100 mg Intravenous Daily    BMET    Component Value Date/Time   NA 131 (L) 09/25/2019 0624   K 3.7 09/25/2019 0624   CL 96 (L) 09/25/2019 0624   CO2 22 09/25/2019 0624   GLUCOSE 120 (H) 09/25/2019 0624   BUN 79 (H) 09/25/2019 0624   CREATININE 5.66 (H) 09/25/2019 0624   CALCIUM 8.3 (L) 09/25/2019 0624   GFRNONAA 8 (L) 09/25/2019 0624   GFRAA 10 (L) 09/25/2019 0624   CBC    Component Value Date/Time   WBC 31.6 (H) 09/25/2019 0624   RBC 2.93 (L) 09/25/2019 0624   HGB 8.9 (L) 09/25/2019 0624   HCT 26.5 (L) 09/25/2019 0624   PLT 258 09/25/2019 0624   MCV 90.4 09/25/2019 0624   MCH 30.4 09/25/2019 0624   MCHC 33.6 09/25/2019 0624   RDW 15.9 (H) 09/25/2019 0624   LYMPHSABS 0.6 (L) 09/17/2019 0042   MONOABS 0.4 09/17/2019 0042    EOSABS 0.0 09/17/2019 0042   BASOSABS 0.0 09/17/2019 0042     Assessment/Plan:  1. AKI due to rhabdomyolysis (related to cocaine and Etoh).  Had been on HD for the past week but has started to show increase in UOP.  Last HD was 09/22/19 with improvement of Scr from 8.39 to 4.45 but with gradual rise since and now 5.66.  No uremic symptoms and CPK levels down to 415.  Continue to hold off on HD as she is hopefully having renal recovery. 2. Rhabdomyolysis- presumably due to cocaine/alcohol and possible loc.   3. Right occipital infarct- per neuro 4. Left arm weakness- per neuro 5. Polysubstance abuse- SW consulted and would benefit from outpatient rehab 6. Abnormal EKC with partial RBBB felt to be due to cocaine 7. HTN - stable 8. Anemia of acute illness- cont to follow and transfuse prn 9. CAP/sepsis- improved with abx 10. Disposition- hold off on outpatient HD for now.  Donetta Potts, MD [employer] [phone]

## 2019-09-25 NOTE — Progress Notes (Signed)
PROGRESS NOTE    TUNJA IVINS  [id number] DOB: 05/31/1971 DOA: 09/14/2019 PCP: Patient, No Pcp Per   Brief Narrative:  Per HPI: 48 y.o.femalewith a medical history significant for tobacco abuse, alcohol abuse, cocaine abuse and marijuana use;who presented to the hospital secondary to worsening numbness in her left upper extremity and left leg. Patient reports his symptoms have been present for approximately 3 months but worsening in the last 2 to 3 days prior to admission. Patient also expressed feeling slightly more tired and having decrease oral intake.No chest pain, no nausea, no vomiting, no productive cough, no sick contacts. Patient reports to continue smoking about half pack per day and is drinking on daily basis. Last use of marijuana was the night prior to admission.  In the ED patient was found to be mildly hypoxic, with elevated WBCs, acute renal failure with a creatinine close to 5, hyperkalemic, hypophosphatemic with a normal radiologic studies suggesting community-acquired pneumonia. She also has a positive MRI demonstrating right occipital stroke. Cultures were taken, sodium bicarbonate, insulin and calcium gluconate provided for hyperkalemia.Patient is started on IV antibiotics and [facility] has been contacted to admit patient for further evaluation and management.  9/22: Patient continues to have left arm swelling and pain. And was due to have tunneled catheter placement for hemodialysis tomorrow. Unfortunately, blood toxicology results will take approximately 5 days to return and anesthesia unwilling to place patient under sedation for this procedure without appropriate toxicology. Urine drug screen is being attempted if patient will make enough urine to check. Appreciate general surgery who may have to place IJ in the interim for dialysis as femoral line cannot stay in place for longer.  9/23:Patient continues to have ongoing left arm swelling and pain, but  CK levels are downtrending. Plans are for hemodialysis today and blood pressures are elevated, therefore amlodipine added by nephrology. Plans to remove femoral line catheter after hemodialysis today with placement of tunneled catheter in a.m. by IR at [facility] due to ongoing cocaine positivity and urine drug screen.  9/24:Patient has undergone tunneled catheter placement with no complications noted. Plans for hemodialysis in a.m. She continues to have elevated blood pressure readings as well as left arm swelling and pain, but CK levels are still downtrending.  9/25: Patient doing well this morning, but still continues to have left arm pain and swelling. Blood pressure readings have improved and plans are to perform hemodialysis today. She is being set up as an acute dialysis outpatient at the moment and remains on prednisone.  9/26: Patient doing well this morning and continues to have left arm tenderness. She tolerated hemodialysis on 9/25 with 2 L removed. CK levels downtrending. She is hopeful that his urine output appears to be improving.  9/27: Patient appears to be doing well this morning and had 1.5 L of urine output yesterday.  Laboratory data stable but she is developing some leukocytosis likely related to steroid use.  Nephrology following.  9/28: Patient continues to remain on prednisone as recommended by neurology.  She is having good urine output and nephrology would like to follow and see if dialysis can be avoided.   Assessment & Plan:   Principal Problem:   CAP (community acquired pneumonia) Active Problems:   Acute renal failure (ARF) (HCC)   Hypoxia   Alcohol abuse   Cocaine abuse (HCC)   Hyperkalemia   Metabolic acidosis   Hyperphosphatemia   Elevated troponin   Oliguria and anuria   1-hypoxia/community-acquired pneumonia-resolved -Afebrile -WBCs  were slowly trending down; anticipated confusion information after initiation of steroids as recommended  by neurology service. -Reports no chest pain -Continue flutter valve, good oxygen saturation on room air currently.  -Completed 5-day course of antibiotics with Rocephin  2-acute renal failure-anuric in setting of rhabdomyolysis, now improving -Continue avoiding nephrotoxic agents -Patient has good urine output and nephrology following for now with no plans for hemodialysis currently as she may not need acute hemodialysis chair on discharge -Femoral catheterdiscontinuedon 9/24 -Tunneled catheter placed on 9/24 -Continue supportive care. -Follow renal function trend. -Renal ultrasound without obstructive uropathy.  3-history of alcohol abuse-no active withdrawal appreciated -Continue CIWA protocol -Continue thiamine and folic acid.  4-tobacco abuse, cocaine abuse/marijuana use -Cessation counseling has been provided -Continue nicotine patch  5-right occipital stroke/concerns for myositis  -Patient presenting with left numbness affecting left side -Abnormal electrolytes and renal failure slightly contributed to left side weakness/numbness. -Follow results from dialysis trial -Continue electrolytes repletion as needed -Followin neurology service recommendations patient has been started on IV steroids. Outpatient and electromyography recommended. -Will continue aspirin for secondary prevention and also statins -LDL of 80 and HDL 31; triglycerides level 241. -A1c 5.3. -Normal B12 level and elevated TSH. -CK level trending down  6-elevated TSH/hypothyroidism -Started on Synthroid and will recommend repeat thyroid profile in 4-6 weeks.  7-mixed hyperlipidemia -Hold Lipitor for now given rhabdomyolysis/elevated LFTs -Rhabdomyolysis improving  8-elevated troponin -No chest pain -No acute ischemic changes appreciated on telemetry or EKG -Reassuring 2D echo with normal ejection fraction and no wall motion abnormalities. -Patient's elevated troponin most likely associated  with ongoing rhabdomyolysis and renal failure. -Continue statins and aspirin. -no further acute inpatient cardiac work up.  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 -Alcohol cessation counseling has been provided. -Trend with repeatCMP in a.m.  10-rhabdomyolysis likely secondary to cocaine use/alcohol use versus necrotizing myopathy-downtrending -Repeat toxicology pending -Plans for tunnel catheter placement in a.m. -Continue oral prednisone 100 mg daily, and will clarify with neurology length of treatment and tapering protocol with follow-up outpatient.  11-polysubstance abuse -Repeat urine toxicology positive for cocaine and marijuana  12-worsening leukocytosis -This does not appear to be infectious and likely related to ongoing steroid use -We will monitor trend with repeat CBC   DVT prophylaxis:heparin  Code Status:Full code. Family Communication:Discussed with mother 9/24 Disposition Plan:Continue close monitoring.Repeat labs in a.m.with plans to hold hemodialysis for now.  Urine output is increasing.  We will plan for disposition in the next 1 to 2 days.  Consultants:  Nephrology service  Neurology  Cardiology service has been curbside (Dr. Bronson Ing)  General Surgery  Procedures:  See below for x-ray reports.  Antimicrobials:   None  Subjective: Patient seen and evaluated today with no new acute complaints or concerns. No acute concerns or events noted overnight.  She is having good urine output noted.  Objective: Vitals:   09/24/19 1358 09/24/19 2239 09/25/19 0658 09/25/19 0818  BP: (!) 141/82 (!) 165/88 (!) 152/88   Pulse: 81 82 82   Resp: 20  18   Temp: 98.2 F (36.8 C) 98.7 F (37.1 C) 98.3 F (36.8 C)   TempSrc: Oral Oral Oral    SpO2: 99% 100% 100% 99%  Weight:      Height:        Intake/Output Summary (Last 24 hours) at 09/25/2019 1059 Last data filed at  09/25/2019 0600 Gross per 24 hour  Intake 920 ml  Output 1800 ml  Net -880 ml   Filed Weights   09/22/19 1845 09/23/19 0507 09/24/19 0617  Weight: 81.5 kg 79.4 kg 81 kg    Examination:  General exam: Appears calm and comfortable  Respiratory system: Clear to auscultation. Respiratory effort normal. Cardiovascular system: S1 & S2 heard, RRR. No JVD, murmurs, rubs, gallops or clicks. No pedal edema. Gastrointestinal system: Abdomen is nondistended, soft and nontender. No organomegaly or masses felt. Normal bowel sounds heard. Central nervous system: Alert and oriented. No focal neurological deficits. Extremities: Symmetric 5 x 5 power. Skin: No rashes, lesions or ulcers Psychiatry: Judgement and insight appear normal. Mood & affect appropriate.     Data Reviewed: I have personally reviewed following labs and imaging studies  CBC: Recent Labs  Lab 09/21/19 0456 09/22/19 0542 09/23/19 0724 09/24/19 0659 09/25/19 0624  WBC 18.2* 19.9* 20.2* 24.9* 31.6*  HGB 10.9* 10.6* 9.1* 8.6* 8.9*  HCT 31.3* 31.1* 26.5* 25.9* 26.5*  MCV 88.4 88.9 89.2 90.9 90.4  PLT 180 164 171 217 0000000   Basic Metabolic Panel: Recent Labs  Lab 09/19/19 0502  09/21/19 0456 09/22/19 0542 09/23/19 0724 09/24/19 0659 09/25/19 0624  NA 130*   < > 130* 129* 132* 133* 131*  K 4.0   < > 3.7 4.0 3.7 4.2 3.7  CL 96*   < > 97* 97* 97* 96* 96*  CO2 23   < > 21* 21* 26 27 22   GLUCOSE 132*   < > 100* 89 123* 125* 120*  BUN 69*   < > 77* 93* 41* 64* 79*  CREATININE 5.79*   < > 6.86* 8.39* 4.54* 5.55* 5.66*  CALCIUM 7.0*   < > 7.6* 7.8* 7.9* 8.2* 8.3*  PHOS 5.6*  --   --   --   --   --   --    < > = values in this interval not displayed.   GFR: Estimated Creatinine Clearance: 12.4 mL/min (A) (by C-G formula based on SCr of 5.66 mg/dL (H)). Liver Function Tests: Recent Labs   Lab 09/21/19 0456 09/22/19 0542 09/23/19 0724 09/24/19 0659 09/25/19 0624  AST 85* 53* 42* 40 28  ALT 209* 135* 89* 75* 59*  ALKPHOS 59 61 52 57 57  BILITOT 0.8 0.8 0.9 0.8 0.6  PROT 5.3* 5.0* 4.9* 5.0* 5.1*  ALBUMIN 2.6* 2.6* 2.5* 2.6* 2.7*   No results for input(s): LIPASE, AMYLASE in the last 168 hours. No results for input(s): AMMONIA in the last 168 hours. Coagulation Profile: Recent Labs  Lab 09/20/19 0954 09/21/19 0456  INR 1.1 1.2   Cardiac Enzymes: Recent Labs  Lab 09/21/19 0456 09/22/19 0542 09/23/19 0724 09/24/19 0659 09/25/19 0624  CKTOTAL 5,115* 2,223* 1,075* 659* 415*   BNP (last 3 results) No results for input(s): PROBNP in the last 8760 hours. HbA1C: No results for input(s): HGBA1C in the last 72 hours. CBG: Recent Labs  Lab 09/24/19 0743 09/24/19 1118 09/24/19 1611 09/24/19 2239 09/25/19 0835  GLUCAP 114* 198* 174* 135* 120*   Lipid Profile: No results for input(s): CHOL, HDL, LDLCALC, TRIG, CHOLHDL, LDLDIRECT in the last 72 hours. Thyroid Function Tests: No results for input(s): TSH, T4TOTAL, FREET4, T3FREE, THYROIDAB in the last 72 hours. Anemia Panel: No results for input(s): VITAMINB12, FOLATE, FERRITIN, TIBC, IRON, RETICCTPCT in the last 72 hours. Sepsis Labs: No results for input(s): PROCALCITON, LATICACIDVEN in the last 168 hours.  No results found  for this or any previous visit (from the past 240 hour(s)).       Radiology Studies: No results found.      Scheduled Meds: . amLODipine  10 mg Oral Daily  . aspirin EC  81 mg Oral Daily  . atorvastatin  20 mg Oral q1800  . Chlorhexidine Gluconate Cloth  6 each Topical Once   And  . Chlorhexidine Gluconate Cloth  6 each Topical Once  . Chlorhexidine Gluconate Cloth  6 each Topical Q0600  . folic acid  1 mg Oral Daily  . insulin aspart  0-5 Units Subcutaneous QHS  . insulin aspart  0-9 Units Subcutaneous TID WC  . levothyroxine  50 mcg Oral Q0600  . multivitamin with  minerals  1 tablet Oral Daily  . nicotine  14 mg Transdermal Daily  . pantoprazole  40 mg Oral Daily  . predniSONE  100 mg Oral Q breakfast  . sodium chloride flush  3 mL Intravenous Q12H  . thiamine  100 mg Oral Daily   Or  . thiamine  100 mg Intravenous Daily   Continuous Infusions: . sodium chloride    . sodium chloride    . sodium chloride       LOS: 11 days    Time spent: 30 minutes    Soundra Lampley Darleen Crocker, DO [facility] Pager [phone]  If 7PM-7AM, please contact night-coverage [email or web address] Password [facility] 09/25/2019, 10:59 AM

## 2019-09-25 NOTE — TOC Progression Note (Signed)
Transition of Care [facility]) - Progression Note    Patient Details  Name: Alicia Flowers MRN: [id number] Date of Birth: [birth year]  [facility]) CM/SW Contact  Shade Flood, LCSW Phone Number: 09/25/2019, 10:00 AM  Clinical Narrative:     TOC following. Met with pt to inquire on whether she learned if she has medical insurance through her employer. Pt showed LCSW paperwork that she got from her employer which appears to be medical leave paperwork. Pt states her mother is still following up to try and see if she has the medical insurance or not. Pt asking if she might qualify for Medicaid. LCSW left [facility] for [facility] Counselor, Tito Dine, requesting that she follow up with pt to assist with this concern.  Per MD, pt is improving some and they are holding off on dialysis today.   TOC will follow and continue to assist with pt's dc planning needs.  Expected Discharge Plan: Home/Self Care Barriers to Discharge: Continued Medical Work up  Expected Discharge Plan and Services Expected Discharge Plan: Home/Self Care In-house Referral: Clinical Social Work     Living arrangements for the past 2 months: Single Family Home                                       Social Determinants of Health (SDOH) Interventions    Readmission Risk Interventions No flowsheet data found.

## 2019-09-25 NOTE — Progress Notes (Signed)
Physical Therapy Treatment Patient Details Name: RUBYE BIA MRN: [id number] DOB: March 06, 1971 Today's Date: 09/25/2019    History of Present Illness Patient is a 48 year old female admitted 09/14/2019 with diagnosis of CAP. PMH:  tobacco abuse, alcohol abuse, cocaine abuse and marijuana use. Numbness and weakness in L arm past couple of months. CVA (+).    PT Comments    Pt eager to work with PT today. No assistance with supine to sit and minimal with stand due to safety/cues for UE placement and use.  Pt took increased time to complete ambulation distance this session as she moves slowly/cautious.  ABle to ambulate 150 feet without oxygen or rest break. Returned to chair without any SOB or fatigue noted.   Follow Up Recommendations        Equipment Recommendations       Recommendations for Other Services       Precautions / Restrictions Precautions Precautions: Fall    Mobility  Bed Mobility Overal bed mobility: Modified Independent Bed Mobility: Supine to Sit     Supine to sit: Modified independent (Device/Increase time)     General bed mobility comments: increased time but no assistance from therapist required  Transfers Overall transfer level: Needs assistance Equipment used: Rolling walker (2 wheeled) Transfers: Sit to/from Stand Sit to Stand: Min guard         General transfer comment: requires safety cues  Ambulation/Gait Ambulation/Gait assistance: Min guard Gait Distance (Feet): 150 Feet Assistive device: Rolling walker (2 wheeled) Gait Pattern/deviations: Decreased step length - right;Decreased step length - left;Decreased stride length Gait velocity: decreased   General Gait Details: minimal signs of fatigue, however ambulates slowly.   Stairs             Wheelchair Mobility    Modified Rankin (Stroke Patients Only)          Cognition Arousal/Alertness: Awake/alert Behavior During Therapy: WFL for tasks  assessed/performed Overall Cognitive Status: Within Functional Limits for tasks assessed                                               Pertinent Vitals/Pain Pain Assessment: No/denies pain           PT Goals (current goals can now be found in the care plan section) Progress towards PT goals: Progressing toward goals       PT Plan Current plan remains appropriate          End of Session Equipment Utilized During Treatment: Gait belt Activity Tolerance: Patient tolerated treatment well Patient left: with call bell/phone within reach;in chair Nurse Communication: Mobility status       Time: [id number] PT Time Calculation (min) (ACUTE ONLY): 15 min  Charges:  $Gait Training: 8-22 mins                     Teena Irani, PTA/CLT [place], [place]ont Tant B 09/25/2019, 11:39 AM

## 2019-09-26 LAB — COMPREHENSIVE METABOLIC PANEL
ALT: 49 U/L — ABNORMAL HIGH (ref 0–44)
AST: 23 U/L (ref 15–41)
Albumin: 2.7 g/dL — ABNORMAL LOW (ref 3.5–5.0)
Alkaline Phosphatase: 53 U/L (ref 38–126)
Anion gap: 11 (ref 5–15)
BUN: 88 mg/dL — ABNORMAL HIGH (ref 6–20)
CO2: 25 mmol/L (ref 22–32)
Calcium: 8.5 mg/dL — ABNORMAL LOW (ref 8.9–10.3)
Chloride: 98 mmol/L (ref 98–111)
Creatinine, Ser: 5.1 mg/dL — ABNORMAL HIGH (ref 0.44–1.00)
GFR calc Af Amer: 11 mL/min — ABNORMAL LOW (ref >60)
GFR calc non Af Amer: 9 mL/min — ABNORMAL LOW (ref >60)
Glucose, Bld: 108 mg/dL — ABNORMAL HIGH (ref 70–99)
Potassium: 4 mmol/L (ref 3.5–5.1)
Sodium: 134 mmol/L — ABNORMAL LOW (ref 135–145)
Total Bilirubin: 0.6 mg/dL (ref 0.3–1.2)
Total Protein: 5.1 g/dL — ABNORMAL LOW (ref 6.5–8.1)

## 2019-09-26 LAB — CBC
HCT: 25.4 % — ABNORMAL LOW (ref 36.0–46.0)
Hemoglobin: 8.6 g/dL — ABNORMAL LOW (ref 12.0–15.0)
MCH: 30.8 pg (ref 26.0–34.0)
MCHC: 33.9 g/dL (ref 30.0–36.0)
MCV: 91 fL (ref 80.0–100.0)
Platelets: 271 10*3/uL (ref 150–400)
RBC: 2.79 MIL/uL — ABNORMAL LOW (ref 3.87–5.11)
RDW: 15.9 % — ABNORMAL HIGH (ref 11.5–15.5)
WBC: 30 10*3/uL — ABNORMAL HIGH (ref 4.0–10.5)
nRBC: 0 % (ref 0.0–0.2)

## 2019-09-26 LAB — GLUCOSE, CAPILLARY
Glucose-Capillary: 99 mg/dL (ref 70–99)
Glucose-Capillary: 139 mg/dL — ABNORMAL HIGH (ref 70–99)
Glucose-Capillary: 140 mg/dL — ABNORMAL HIGH (ref 70–99)
Glucose-Capillary: 190 mg/dL — ABNORMAL HIGH (ref 70–99)

## 2019-09-26 LAB — CK: Total CK: 261 U/L — ABNORMAL HIGH (ref 38–234)

## 2019-09-26 LAB — RENAL FUNCTION PANEL
Albumin: 2.7 g/dL — ABNORMAL LOW (ref 3.5–5.0)
Anion gap: 11 (ref 5–15)
BUN: 87 mg/dL — ABNORMAL HIGH (ref 6–20)
CO2: 24 mmol/L (ref 22–32)
Calcium: 8.6 mg/dL — ABNORMAL LOW (ref 8.9–10.3)
Chloride: 98 mmol/L (ref 98–111)
Creatinine, Ser: 5.08 mg/dL — ABNORMAL HIGH (ref 0.44–1.00)
GFR calc Af Amer: 11 mL/min — ABNORMAL LOW (ref >60)
GFR calc non Af Amer: 9 mL/min — ABNORMAL LOW (ref >60)
Glucose, Bld: 108 mg/dL — ABNORMAL HIGH (ref 70–99)
Phosphorus: 5.5 mg/dL — ABNORMAL HIGH (ref 2.5–4.6)
Potassium: 3.8 mmol/L (ref 3.5–5.1)
Sodium: 133 mmol/L — ABNORMAL LOW (ref 135–145)

## 2019-09-26 MED ORDER — NEPRO/CARBSTEADY PO LIQD
237.0000 mL | ORAL | Status: DC
  Administered 2019-09-26 – 2019-09-27 (×2): 237 mL via ORAL

## 2019-09-26 MED ORDER — PREDNISONE 20 MG PO TABS
80.0000 mg | ORAL_TABLET | Freq: Every day | ORAL | Status: DC
  Administered 2019-09-27 – 2019-09-28 (×2): 80 mg via ORAL
  Filled 2019-09-26 (×2): qty 4

## 2019-09-26 NOTE — Progress Notes (Signed)
Initial Nutrition Assessment  DOCUMENTATION CODES:   Obesity unspecified  INTERVENTION:  Nepro Shake po daily, each supplement provides 425 kcal and 19 grams protein    NUTRITION DIAGNOSIS:   Impaired nutrient utilization related to (Alcohol and drug abuse) as evidenced by (patient history: ETOH, tobacco, marijuana, cocaine use and rhadomyolysis related to drug use per MD).   GOAL:   Patient will meet greater than or equal to 90% of their needs   MONITOR:   PO intake, Supplement acceptance, Labs, Weight trends, I & O's REASON FOR ASSESSMENT:   LOS    ASSESSMENT:  Patient is a 48 yo female who presents with history of ETOH, tobacco, marijuana, cocaine abuse. AKI due to rhabdomyolysis. HD required for a week- last tx 9/25.    Patient intake: 0-50% 9/23-9/26 with a recent significant improvement 75-100%. Emphasized importance of nutrition intake and dietary compliance in association with optimizing renal function.   Medications reviewed and include: folic acid, SSI, Protonix, Thiamine, prednisone.  Labs: BMP Latest Ref Rng & Units 09/26/2019 09/26/2019 09/25/2019  Glucose 70 - 99 mg/dL 108(H) 108(H) 120(H)  BUN 6 - 20 mg/dL 87(H) 88(H) 79(H)  Creatinine 0.44 - 1.00 mg/dL 5.08(H) 5.10(H) 5.66(H)  Sodium 135 - 145 mmol/L 133(L) 134(L) 131(L)  Potassium 3.5 - 5.1 mmol/L 3.8 4.0 3.7  Chloride 98 - 111 mmol/L 98 98 96(L)  CO2 22 - 32 mmol/L 24 25 22   Calcium 8.9 - 10.3 mg/dL 8.6(L) 8.5(L) 8.3(L)     NUTRITION - FOCUSED PHYSICAL EXAM:  Unable to complete Nutrition-Focused physical exam at this time.     Diet Order:   Diet Order            Diet renal with fluid restriction Fluid restriction: 1200 mL Fluid; Room service appropriate? Yes; Fluid consistency: Thin  Diet effective now              EDUCATION NEEDS:   Education needs have been addressed Skin:  Skin Assessment: Reviewed RN Assessment(abrasion to knee and arm)  Last BM:  9/28  Height:   Ht Readings  from Last 1 Encounters:  09/14/19 5\' 3"  (1.6 m)    Weight:   Wt Readings from Last 1 Encounters:  09/24/19 81 kg  Admit wt 77-78 kg  Ideal Body Weight:  52 kg  BMI:  Body mass index is 31.63 kg/m.  Estimated Nutritional Needs:   Kcal:  [id number]  Protein:  78-88 gr  Fluid:  1200 ml daily   Colman Cater MS,RD,CSG,LDN Office: [phone] Pager: [phone]

## 2019-09-26 NOTE — Progress Notes (Signed)
Late entry for this morning. Some redness noted to dressing around dialysis access. Notified Rockwell Alexandria, RN with dialysis to change dressing today. Donavan Foil, RN

## 2019-09-26 NOTE — Progress Notes (Signed)
Patient ID: Alicia Flowers, female   DOB: [birth year], 48 y.o.   MRN: [id number] S:Feeling better today and noticed increased UOP. O:BP (!) 173/97 (BP Location: Right Arm)   Pulse 88   Temp 98.4 F (36.9 C) (Oral)   Resp 16   Ht 5\' 3"  (1.6 m)   Wt 81 kg   LMP 09/07/2019   SpO2 100%   BMI 31.63 kg/m   Intake/Output Summary (Last 24 hours) at 09/26/2019 0919 Last data filed at 09/26/2019 0600 Gross per 24 hour  Intake 777 ml  Output 2550 ml  Net -1773 ml   Intake/Output: I/O last 3 completed shifts: In: [id number] [P.O.:1437] Out: [id number] [Urine:3650]  Intake/Output this shift:  No intake/output data recorded. Weight change:  Gen: NAD CVS: no rub Resp: cta Abd: +BS, soft, NT/ND Ext: + edema lower ext and greatest in LUE  Dialysis access:  RIJ tdc c/d/i, no erythema or drainage.  Recent Labs  Lab 09/20/19 0414 09/21/19 0456 09/22/19 0542 09/23/19 0724 09/24/19 0659 09/25/19 0624 09/26/19 0651  NA 131* 130* 129* 132* 133* 131* 133*  134*  K 4.0 3.7 4.0 3.7 4.2 3.7 3.8  4.0  CL 95* 97* 97* 97* 96* 96* 98  98  CO2 21* 21* 21* 26 27 22 24  25   GLUCOSE 136* 100* 89 123* 125* 120* 108*  108*  BUN 94* 77* 93* 41* 64* 79* 87*  88*  CREATININE 7.31* 6.86* 8.39* 4.54* 5.55* 5.66* 5.08*  5.10*  ALBUMIN 2.8* 2.6* 2.6* 2.5* 2.6* 2.7* 2.7*  2.7*  CALCIUM 7.4* 7.6* 7.8* 7.9* 8.2* 8.3* 8.6*  8.5*  PHOS  --   --   --   --   --   --  5.5*  AST 147* 85* 53* 42* 40 28 23  ALT 266* 209* 135* 89* 75* 59* 49*   Liver Function Tests: Recent Labs  Lab 09/24/19 0659 09/25/19 0624 09/26/19 0651  AST 40 28 23  ALT 75* 59* 49*  ALKPHOS 57 57 53  BILITOT 0.8 0.6 0.6  PROT 5.0* 5.1* 5.1*  ALBUMIN 2.6* 2.7* 2.7*  2.7*   No results for input(s): LIPASE, AMYLASE in the last 168 hours. No results for input(s): AMMONIA in the last 168 hours. CBC: Recent Labs  Lab 09/22/19 0542 09/23/19 0724 09/24/19 0659 09/25/19 0624 09/26/19 0651  WBC 19.9* 20.2* 24.9* 31.6* 30.0*  HGB 10.6*  9.1* 8.6* 8.9* 8.6*  HCT 31.1* 26.5* 25.9* 26.5* 25.4*  MCV 88.9 89.2 90.9 90.4 91.0  PLT 164 171 217 258 271   Cardiac Enzymes: Recent Labs  Lab 09/22/19 0542 09/23/19 0724 09/24/19 0659 09/25/19 0624 09/26/19 0651  CKTOTAL 2,223* 1,075* 659* 415* 261*   CBG: Recent Labs  Lab 09/25/19 0835 09/25/19 1223 09/25/19 1719 09/25/19 2014 09/26/19 0737  GLUCAP 120* 140* 156* 218* 99    Iron Studies: No results for input(s): IRON, TIBC, TRANSFERRIN, FERRITIN in the last 72 hours. Studies/Results: No results found. Marland Kitchen amLODipine  10 mg Oral Daily  . aspirin EC  81 mg Oral Daily  . atorvastatin  20 mg Oral q1800  . Chlorhexidine Gluconate Cloth  6 each Topical Once   And  . Chlorhexidine Gluconate Cloth  6 each Topical Once  . Chlorhexidine Gluconate Cloth  6 each Topical Q0600  . folic acid  1 mg Oral Daily  . insulin aspart  0-5 Units Subcutaneous QHS  . insulin aspart  0-9 Units Subcutaneous TID WC  . levothyroxine  50 mcg  Oral [id number]  . multivitamin with minerals  1 tablet Oral Daily  . nicotine  14 mg Transdermal Daily  . pantoprazole  40 mg Oral Daily  . predniSONE  100 mg Oral Q breakfast  . sodium chloride flush  3 mL Intravenous Q12H  . thiamine  100 mg Oral Daily   Or  . thiamine  100 mg Intravenous Daily    BMET    Component Value Date/Time   NA 133 (L) 09/26/2019 0651   NA 134 (L) 09/26/2019 0651   K 3.8 09/26/2019 0651   K 4.0 09/26/2019 0651   CL 98 09/26/2019 0651   CL 98 09/26/2019 0651   CO2 24 09/26/2019 0651   CO2 25 09/26/2019 0651   GLUCOSE 108 (H) 09/26/2019 0651   GLUCOSE 108 (H) 09/26/2019 0651   BUN 87 (H) 09/26/2019 0651   BUN 88 (H) 09/26/2019 0651   CREATININE 5.08 (H) 09/26/2019 0651   CREATININE 5.10 (H) 09/26/2019 0651   CALCIUM 8.6 (L) 09/26/2019 0651   CALCIUM 8.5 (L) 09/26/2019 0651   GFRNONAA 9 (L) 09/26/2019 0651   GFRNONAA 9 (L) 09/26/2019 0651   GFRAA 11 (L) 09/26/2019 0651   GFRAA 11 (L) 09/26/2019 0651   CBC     Component Value Date/Time   WBC 30.0 (H) 09/26/2019 0651   RBC 2.79 (L) 09/26/2019 0651   HGB 8.6 (L) 09/26/2019 0651   HCT 25.4 (L) 09/26/2019 0651   PLT 271 09/26/2019 0651   MCV 91.0 09/26/2019 0651   MCH 30.8 09/26/2019 0651   MCHC 33.9 09/26/2019 0651   RDW 15.9 (H) 09/26/2019 0651   LYMPHSABS 0.6 (L) 09/17/2019 0042   MONOABS 0.4 09/17/2019 0042   EOSABS 0.0 09/17/2019 0042   BASOSABS 0.0 09/17/2019 0042     Assessment/Plan:  1. AKI due to rhabdomyolysis (related to cocaine and Etoh).  Had been on HD for the past week but has started to show increase in UOP.  Last HD was 09/22/19 with improvement of Scr from 8.39 to 4.45 but with gradual rise since and peaked at 5.66 09/25/19 but down to 5.1 without HD.  No uremic symptoms and CPK levels down to 415-->216.   1. Continue to hold off on HD as she is showing signs of continued renal recovery. 2. If creatinine drops again tomorrow will ask Dr. Constance Haw to remove [facility] 2. Rhabdomyolysis- presumably due to cocaine/alcohol and possible loc.   3. Right occipital infarct- per neuro 4. Left arm weakness- per neuro 5. Polysubstance abuse- SW consulted and would benefit from outpatient rehab 6. Abnormal EKC with partial RBBB felt to be due to cocaine 7. HTN - stable 8. Anemia of acute illness- cont to follow and transfuse prn 9. CAP/sepsis- improved with abx 10. Disposition- hold off on outpatient HD for now. Donetta Potts, MD [employer] [phone]

## 2019-09-26 NOTE — TOC Progression Note (Signed)
Transition of Care [facility]) - Progression Note    Patient Details  Name: Alicia Flowers MRN: [id number] Date of Birth: 02/22/1971  Transition of Care [facility]) CM/SW Contact  Ihor Gully, LCSW Phone Number: 09/26/2019, 1:48 PM  Clinical Narrative:    Patient indicates that she met with the financial counselor. Patient provided financial counselor with all of her information as it relates to Medicaid.  LCSW discussed referral for current SA. Patient states that she is aware of how to access SA treatment and is not interested in rehab at this time.    Expected Discharge Plan: Home/Self Care Barriers to Discharge: Continued Medical Work up  Expected Discharge Plan and Services Expected Discharge Plan: Home/Self Care In-house Referral: Clinical Social Work     Living arrangements for the past 2 months: Single Family Home                                       Social Determinants of Health (SDOH) Interventions    Readmission Risk Interventions No flowsheet data found.

## 2019-09-26 NOTE — Progress Notes (Signed)
Patient has no iv access.  Attempted to restart IV, patient refused IV restart at this time.  Will continue to monitor patient.

## 2019-09-26 NOTE — Progress Notes (Signed)
Patient no longer has IV access. Refused new IV access during night per report. Notified Dr. Manuella Ghazi this am. Stated okay to leave IV access out. Donavan Foil, RN

## 2019-09-26 NOTE — Progress Notes (Signed)
PROGRESS NOTE    Alicia Flowers  [id number] DOB: [birth year] DOA: 09/14/2019 PCP: Patient, No Pcp Per   Brief Narrative:  Per HPI: 48 y.o.femalewith a medical history significant for tobacco abuse, alcohol abuse, cocaine abuse and marijuana use;who presented to the hospital secondary to worsening numbness in her left upper extremity and left leg. Patient reports his symptoms have been present for approximately 3 months but worsening in the last 2 to 3 days prior to admission. Patient also expressed feeling slightly more tired and having decrease oral intake.No chest pain, no nausea, no vomiting, no productive cough, no sick contacts. Patient reports to continue smoking about half pack per day and is drinking on daily basis. Last use of marijuana was the night prior to admission.  In the ED patient was found to be mildly hypoxic, with elevated WBCs, acute renal failure with a creatinine close to 5, hyperkalemic, hypophosphatemic with a normal radiologic studies suggesting community-acquired pneumonia. She also has a positive MRI demonstrating right occipital stroke. Cultures were taken, sodium bicarbonate, insulin and calcium gluconate provided for hyperkalemia.Patient is started on IV antibiotics and [facility] has been contacted to admit patient for further evaluation and management.  9/22: Patient continues to have left arm swelling and pain. And was due to have tunneled catheter placement for hemodialysis tomorrow. Unfortunately, blood toxicology results will take approximately 5 days to return and anesthesia unwilling to place patient under sedation for this procedure without appropriate toxicology. Urine drug screen is being attempted if patient will make enough urine to check. Appreciate general surgery who may have to place IJ in the interim for dialysis as femoral line cannot stay in place for longer.  9/23:Patient continues to have ongoing left arm swelling and pain, but  CK levels are downtrending. Plans are for hemodialysis today and blood pressures are elevated, therefore amlodipine added by nephrology. Plans to remove femoral line catheter after hemodialysis today with placement of tunneled catheter in a.m. by IR at [facility] due to ongoing cocaine positivity and urine drug screen.  9/24:Patient has undergone tunneled catheter placement with no complications noted. Plans for hemodialysis in a.m. She continues to have elevated blood pressure readings as well as left arm swelling and pain, but CK levels are still downtrending.  9/25: Patient doing well this morning, but still continues to have left arm pain and swelling. Blood pressure readings have improved and plans are to perform hemodialysis today. She is being set up as an acute dialysis outpatient at the moment and remains on prednisone.  9/26: Patient doing well this morning and continues to have left arm tenderness. She tolerated hemodialysis on 9/25 with 2 L removed. CK levels downtrending. She is hopeful that his urine output appears to be improving.  9/27:Patient appears to be doing well this morning and had 1.5 L of urine output yesterday. Laboratory data stable but she is developing some leukocytosis likely related to steroid use. Nephrology following.  9/28: Patient continues to remain on prednisone as recommended by neurology.  She is having good urine output and nephrology would like to follow and see if dialysis can be avoided.  9/29: Patient continues to remain on prednisone as recommended by neurology, but will decrease to 80mg  today. She will need to taper over weeks to months.  She is having great urine output and nephrology is following with plans to remove temporary dialysis catheter since she is improving, but would like to monitor for now.  Assessment & Plan:   Principal Problem:  CAP (community acquired pneumonia) Active Problems:   Acute renal failure (ARF) (HCC)    Hypoxia   Alcohol abuse   Cocaine abuse (HCC)   Hyperkalemia   Metabolic acidosis   Hyperphosphatemia   Elevated troponin   Oliguria and anuria   1-hypoxia/community-acquired pneumonia-resolved -Afebrile -WBCs were slowly trending down; anticipated confusion information after initiation of steroids as recommended by neurology service. -Reports no chest pain -Continue flutter valve, good oxygen saturation on room air currently.  -Completed 5-day course of antibiotics with Rocephin  2-acute renal failure-anuric in setting of rhabdomyolysis, now improving -Continue avoiding nephrotoxic agents -Patient has good urine output and nephrology following for now with no plans for hemodialysis currently as she may not need acute hemodialysis chair on discharge -Femoral catheterdiscontinuedon 9/24 -Tunneled catheter placed on 9/24 -Continue supportive care. -Follow renal function trend which is improving -Renal ultrasound without obstructive uropathy -If she continues to improve, may plan to remove temporary hemodialysis catheter soon and anticipate discharge without need for acute dialysis chair  3-history of alcohol abuse-no active withdrawal appreciated -Continue CIWA protocol -Continue thiamine and folic acid.  4-tobacco abuse, cocaine abuse/marijuana use -Cessation counseling has been provided -Continue nicotine patch  5-right occipital stroke/concerns for myositis  -Patient presenting with left numbness affecting left side -Abnormal electrolytes and renal failure slightly contributed to left side weakness/numbness. -Follow results from dialysis trial -Continue electrolytes repletion as needed -Followin neurology service recommendations patient has been started on IV steroids. Outpatient and electromyography recommended. -Will continue aspirin for secondary prevention and also statins -LDL of 80 and HDL 31; triglycerides level 241. -A1c 5.3. -Normal B12 level and elevated  TSH. -CK level trending down  6-elevated TSH/hypothyroidism -Started on Synthroid and will recommend repeat thyroid profile in 4-6 weeks.  7-mixed hyperlipidemia -Hold Lipitor for now given rhabdomyolysis/elevated LFTs -Rhabdomyolysis improving  8-elevated troponin -No chest pain -No acute ischemic changes appreciated on telemetry or EKG -Reassuring 2D echo with normal ejection fraction and no wall motion abnormalities. -Patient's elevated troponin most likely associated with ongoing rhabdomyolysis and renal failure. -Continue statins and aspirin. -no further acute inpatient cardiac work up.  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 -Alcohol cessation counseling has been provided.  10-rhabdomyolysis likely secondary to cocaine use/alcohol use versus necrotizing myopathy-downtrending -Repeat toxicology pending -Plans for tunnel catheter placement in a.m. -Continue oral prednisone now on 80mg  daily starting tomorrow. Apparently will need tapering over weeks to months with close follow up to Neurology outpatient.  11-polysubstance abuse -Repeat urine toxicology positive for cocaine and marijuana  12-worsening leukocytosis -This does not appear to be infectious and likely related to ongoing steroid use -We will monitor trend with repeat CBC   DVT prophylaxis:heparin  Code Status:Full code. Family Communication:Discussed with mother 9/24 Disposition Plan:Continue close monitoring.Repeat labs in a.m.with plans to hold hemodialysis for now. Urine output is increasing. We will plan for disposition inthe next 1 to 2 days.  Consultants:  Nephrology service  Neurology  Cardiology service has been curbside (Dr. Bronson Ing)  General Surgery  Procedures:  See below  for x-ray reports.  Antimicrobials:   None   Subjective: Patient seen and evaluated today with no new acute complaints or concerns. No acute concerns or events noted overnight.  She is overall doing well with less arm tenderness and is having increased urine output.  She has not needed hemodialysis so far  this week.  Objective: Vitals:   09/25/19 0818 09/25/19 1600 09/25/19 2121 09/26/19 0546  BP:  (!) 151/87 (!) 158/91 (!) 173/97  Pulse:  87 86 88  Resp:  18 16 16   Temp:  98.2 F (36.8 C) 98.9 F (37.2 C) 98.4 F (36.9 C)  TempSrc:   Oral Oral  SpO2: 99% 100% 100% 100%  Weight:      Height:        Intake/Output Summary (Last 24 hours) at 09/26/2019 1255 Last data filed at 09/26/2019 0930 Gross per 24 hour  Intake 717 ml  Output 2450 ml  Net -1733 ml   Filed Weights   09/22/19 1845 09/23/19 0507 09/24/19 0617  Weight: 81.5 kg 79.4 kg 81 kg    Examination:  General exam: Appears calm and comfortable  Respiratory system: Clear to auscultation. Respiratory effort normal. Cardiovascular system: S1 & S2 heard, RRR. No JVD, murmurs, rubs, gallops or clicks. No pedal edema. Gastrointestinal system: Abdomen is nondistended, soft and nontender. No organomegaly or masses felt. Normal bowel sounds heard. Central nervous system: Alert and oriented. No focal neurological deficits. Extremities: Symmetric 5 x 5 power. Skin: No rashes, lesions or ulcers Psychiatry: Judgement and insight appear normal. Mood & affect appropriate.     Data Reviewed: I have personally reviewed following labs and imaging studies  CBC: Recent Labs  Lab 09/22/19 0542 09/23/19 0724 09/24/19 0659 09/25/19 0624 09/26/19 0651  WBC 19.9* 20.2* 24.9* 31.6* 30.0*  HGB 10.6* 9.1* 8.6* 8.9* 8.6*  HCT 31.1* 26.5* 25.9* 26.5* 25.4*  MCV 88.9 89.2 90.9 90.4 91.0  PLT 164 171 217 258 [id number]   Basic Metabolic Panel: Recent Labs  Lab 09/22/19 0542 09/23/19 0724 09/24/19 0659 09/25/19 0624 09/26/19  0651  NA 129* 132* 133* 131* 133*  134*  K 4.0 3.7 4.2 3.7 3.8  4.0  CL 97* 97* 96* 96* 98  98  CO2 21* 26 27 22 24  25   GLUCOSE 89 123* 125* 120* 108*  108*  BUN 93* 41* 64* 79* 87*  88*  CREATININE 8.39* 4.54* 5.55* 5.66* 5.08*  5.10*  CALCIUM 7.8* 7.9* 8.2* 8.3* 8.6*  8.5*  PHOS  --   --   --   --  5.5*   GFR: Estimated Creatinine Clearance: 13.8 mL/min (A) (by C-G formula based on SCr of 5.08 mg/dL (H)). Liver Function Tests: Recent Labs  Lab 09/22/19 0542 09/23/19 0724 09/24/19 0659 09/25/19 0624 09/26/19 0651  AST 53* 42* 40 28 23  ALT 135* 89* 75* 59* 49*  ALKPHOS 61 52 57 57 53  BILITOT 0.8 0.9 0.8 0.6 0.6  PROT 5.0* 4.9* 5.0* 5.1* 5.1*  ALBUMIN 2.6* 2.5* 2.6* 2.7* 2.7*  2.7*   No results for input(s): LIPASE, AMYLASE in the last 168 hours. No results for input(s): AMMONIA in the last 168 hours. Coagulation Profile: Recent Labs  Lab 09/20/19 0954 09/21/19 0456  INR 1.1 1.2   Cardiac Enzymes: Recent Labs  Lab 09/22/19 0542 09/23/19 0724 09/24/19 0659 09/25/19 0624 09/26/19 0651  CKTOTAL 2,223* 1,075* 659* 415* 261*   BNP (last 3 results) No results for input(s): PROBNP in the last 8760 hours. HbA1C: No results for input(s): HGBA1C in the last 72 hours. CBG: Recent Labs  Lab 09/25/19 1223 09/25/19 1719 09/25/19 2014 09/26/19 0737 09/26/19 1109  GLUCAP 140* 156* 218* 99 139*   Lipid Profile: No results for input(s): CHOL, HDL, LDLCALC, TRIG, CHOLHDL, LDLDIRECT in the last 72 hours. Thyroid Function Tests:  No results for input(s): TSH, T4TOTAL, FREET4, T3FREE, THYROIDAB in the last 72 hours. Anemia Panel: No results for input(s): VITAMINB12, FOLATE, FERRITIN, TIBC, IRON, RETICCTPCT in the last 72 hours. Sepsis Labs: No results for input(s): PROCALCITON, LATICACIDVEN in the last 168 hours.  No results found for this or any previous visit (from the past 240 hour(s)).       Radiology Studies: No results found.       Scheduled Meds: . amLODipine  10 mg Oral Daily  . aspirin EC  81 mg Oral Daily  . atorvastatin  20 mg Oral q1800  . Chlorhexidine Gluconate Cloth  6 each Topical Once   And  . Chlorhexidine Gluconate Cloth  6 each Topical Once  . Chlorhexidine Gluconate Cloth  6 each Topical Q0600  . folic acid  1 mg Oral Daily  . insulin aspart  0-5 Units Subcutaneous QHS  . insulin aspart  0-9 Units Subcutaneous TID WC  . levothyroxine  50 mcg Oral Q0600  . multivitamin with minerals  1 tablet Oral Daily  . nicotine  14 mg Transdermal Daily  . pantoprazole  40 mg Oral Daily  . predniSONE  100 mg Oral Q breakfast  . sodium chloride flush  3 mL Intravenous Q12H  . thiamine  100 mg Oral Daily   Or  . thiamine  100 mg Intravenous Daily   Continuous Infusions: . sodium chloride    . sodium chloride    . sodium chloride       LOS: 12 days    Time spent: 30 minutes    Javarious Elsayed Darleen Crocker, DO [facility] Pager [phone]  If 7PM-7AM, please contact night-coverage [email or web address] Password [facility] 09/26/2019, 12:55 PM

## 2019-09-27 ENCOUNTER — Ambulatory Visit (HOSPITAL_COMMUNITY): Payer: Self-pay | Attending: Nephrology

## 2019-09-27 LAB — CBC
HCT: 24.3 % — ABNORMAL LOW (ref 36.0–46.0)
Hemoglobin: 8.1 g/dL — ABNORMAL LOW (ref 12.0–15.0)
MCH: 30.6 pg (ref 26.0–34.0)
MCHC: 33.3 g/dL (ref 30.0–36.0)
MCV: 91.7 fL (ref 80.0–100.0)
Platelets: 279 10*3/uL (ref 150–400)
RBC: 2.65 MIL/uL — ABNORMAL LOW (ref 3.87–5.11)
RDW: 15.9 % — ABNORMAL HIGH (ref 11.5–15.5)
WBC: 27.5 10*3/uL — ABNORMAL HIGH (ref 4.0–10.5)
nRBC: 0 % (ref 0.0–0.2)

## 2019-09-27 LAB — RENAL FUNCTION PANEL
Albumin: 2.6 g/dL — ABNORMAL LOW (ref 3.5–5.0)
Anion gap: 12 (ref 5–15)
BUN: 93 mg/dL — ABNORMAL HIGH (ref 6–20)
CO2: 25 mmol/L (ref 22–32)
Calcium: 8.7 mg/dL — ABNORMAL LOW (ref 8.9–10.3)
Chloride: 99 mmol/L (ref 98–111)
Creatinine, Ser: 4.23 mg/dL — ABNORMAL HIGH (ref 0.44–1.00)
GFR calc Af Amer: 14 mL/min — ABNORMAL LOW (ref >60)
GFR calc non Af Amer: 12 mL/min — ABNORMAL LOW (ref >60)
Glucose, Bld: 128 mg/dL — ABNORMAL HIGH (ref 70–99)
Phosphorus: 5 mg/dL — ABNORMAL HIGH (ref 2.5–4.6)
Potassium: 4 mmol/L (ref 3.5–5.1)
Sodium: 136 mmol/L (ref 135–145)

## 2019-09-27 LAB — GLUCOSE, CAPILLARY
Glucose-Capillary: 123 mg/dL — ABNORMAL HIGH (ref 70–99)
Glucose-Capillary: 118 mg/dL — ABNORMAL HIGH (ref 70–99)
Glucose-Capillary: 157 mg/dL — ABNORMAL HIGH (ref 70–99)
Glucose-Capillary: 160 mg/dL — ABNORMAL HIGH (ref 70–99)

## 2019-09-27 LAB — CK: Total CK: 183 U/L (ref 38–234)

## 2019-09-27 MED ORDER — ALUM & MAG HYDROXIDE-SIMETH 200-200-20 MG/5ML PO SUSP
15.0000 mL | ORAL | Status: DC | PRN
  Administered 2019-09-27: 15 mL via ORAL
  Filled 2019-09-27: qty 30

## 2019-09-27 MED ORDER — HEPARIN SODIUM (PORCINE) 5000 UNIT/ML IJ SOLN
5000.0000 [IU] | Freq: Three times a day (TID) | INTRAMUSCULAR | Status: DC
  Administered 2019-09-27 (×2): 5000 [IU] via SUBCUTANEOUS
  Filled 2019-09-27 (×2): qty 1

## 2019-09-27 NOTE — Progress Notes (Signed)
Physical Therapy Treatment Patient Details Name: Alicia Flowers MRN: [id number] DOB: July 26, 1971 Today's Date: 09/27/2019    History of Present Illness Patient is a 48 year old female admitted 09/14/2019 with diagnosis of CAP. PMH:  tobacco abuse, alcohol abuse, cocaine abuse and marijuana use. Numbness and weakness in L arm past couple of months. CVA (+).    PT Comments    Pt was seen for mobility and to review ROM to ankles and UE's due to edema and stiffness.  Pt responds well to gait and ROM, and is expecting to go home tomorrow if no further issues are defined.  Follow acutely for these needs, focusing on safety with gait and reasonable limits of distance to avoid overstressing her system.     Follow Up Recommendations  Home health PT     Equipment Recommendations  Rolling walker with 5" wheels    Recommendations for Other Services       Precautions / Restrictions Precautions Precautions: Fall Precaution Comments: monitor for fatigue in standing Restrictions Weight Bearing Restrictions: No    Mobility  Bed Mobility Overal bed mobility: Modified Independent                Transfers Overall transfer level: Modified independent Equipment used: Rolling walker (2 wheeled) Transfers: Sit to/from Stand Sit to Stand: Supervision         General transfer comment: supervised for safety  Ambulation/Gait Ambulation/Gait assistance: Min guard Gait Distance (Feet): 150 Feet Assistive device: Rolling walker (2 wheeled) Gait Pattern/deviations: Decreased step length - right;Decreased step length - left;Decreased stride length Gait velocity: decreased Gait velocity interpretation: <1.31 ft/sec, indicative of household ambulator General Gait Details: fatigue but took her time   Marine scientist [place] (Stroke Patients Only)       Balance Overall balance assessment: Needs assistance Sitting-balance support: Feet  supported;No upper extremity supported Sitting balance-Leahy Scale: Good Sitting balance - Comments: seated at bedside   Standing balance support: Bilateral upper extremity supported;During functional activity Standing balance-Leahy Scale: Fair                              Cognition Arousal/Alertness: Awake/alert Behavior During Therapy: WFL for tasks assessed/performed Overall Cognitive Status: Within Functional Limits for tasks assessed                                        Exercises General Exercises - Upper Extremity Shoulder Flexion: AAROM;Both General Exercises - Lower Extremity Ankle Circles/Pumps: Seated;AROM;Both;10 reps;Strengthening    General Comments General comments (skin integrity, edema, etc.): pt is expecting to be discharged home tomorrow and reports no stairs need to be practiced      Pertinent Vitals/Pain Pain Assessment: No/denies pain    Home Living                      Prior Function            PT Goals (current goals can now be found in the care plan section) Acute Rehab PT Goals Patient Stated Goal: return home with family to assist PT Goal Formulation: With patient Progress towards PT goals: Progressing toward goals    Frequency    Min 3X/week      PT Plan Current  plan remains appropriate    Co-evaluation              AM-PAC PT "6 Clicks" Mobility   Outcome Measure  Help needed turning from your back to your side while in a flat bed without using bedrails?: None Help needed moving from lying on your back to sitting on the side of a flat bed without using bedrails?: None Help needed moving to and from a bed to a chair (including a wheelchair)?: A Little Help needed standing up from a chair using your arms (e.g., wheelchair or bedside chair)?: A Little Help needed to walk in hospital room?: A Little Help needed climbing 3-5 steps with a railing? : A Lot 6 Click Score: 19    End of  Session Equipment Utilized During Treatment: Gait belt Activity Tolerance: Patient tolerated treatment well Patient left: with call bell/phone within reach;in chair Nurse Communication: Mobility status PT Visit Diagnosis: Unsteadiness on feet (R26.81);Other abnormalities of gait and mobility (R26.89);Muscle weakness (generalized) (M62.81) Hemiplegia - Right/Left: Left Hemiplegia - dominant/non-dominant: Non-dominant Hemiplegia - caused by: Cerebral infarction     Time: [id number] PT Time Calculation (min) (ACUTE ONLY): 25 min  Charges:  $Gait Training: 8-22 mins $Therapeutic Exercise: 8-22 mins                    Ramond Dial 09/27/2019, 5:06 PM   Mee Hives, PT MS Acute Rehab Dept. Number: [place] [facility] [place]

## 2019-09-27 NOTE — Progress Notes (Signed)
PROGRESS NOTE    Alicia Flowers  [id number] DOB: April 16, 1971 DOA: 09/14/2019 PCP: Patient, No Pcp Per   Brief Narrative:  Per HPI: 48 y.o.femalewith a medical history significant for tobacco abuse, alcohol abuse, cocaine abuse and marijuana use;who presented to the hospital secondary to worsening numbness in her left upper extremity and left leg. Patient reports his symptoms have been present for approximately 3 months but worsening in the last 2 to 3 days prior to admission. Patient also expressed feeling slightly more tired and having decrease oral intake.No chest pain, no nausea, no vomiting, no productive cough, no sick contacts. Patient reports to continue smoking about half pack per day and is drinking on daily basis. Last use of marijuana was the night prior to admission.  In the ED patient was found to be mildly hypoxic, with elevated WBCs, acute renal failure with a creatinine close to 5, hyperkalemic, hypophosphatemic with a normal radiologic studies suggesting community-acquired pneumonia. She also has a positive MRI demonstrating right occipital stroke. Cultures were taken, sodium bicarbonate, insulin and calcium gluconate provided for hyperkalemia.Patient is started on IV antibiotics and [facility] has been contacted to admit patient for further evaluation and management.  9/22: Patient continues to have left arm swelling and pain. And was due to have tunneled catheter placement for hemodialysis tomorrow. Unfortunately, blood toxicology results will take approximately 5 days to return and anesthesia unwilling to place patient under sedation for this procedure without appropriate toxicology. Urine drug screen is being attempted if patient will make enough urine to check. Appreciate general surgery who may have to place IJ in the interim for dialysis as femoral line cannot stay in place for longer.  9/23:Patient continues to have ongoing left arm swelling and pain, but  CK levels are downtrending. Plans are for hemodialysis today and blood pressures are elevated, therefore amlodipine added by nephrology. Plans to remove femoral line catheter after hemodialysis today with placement of tunneled catheter in a.m. by IR at [facility] due to ongoing cocaine positivity and urine drug screen.  9/24:Patient has undergone tunneled catheter placement with no complications noted. Plans for hemodialysis in a.m. She continues to have elevated blood pressure readings as well as left arm swelling and pain, but CK levels are still downtrending.  9/25: Patient doing well this morning, but still continues to have left arm pain and swelling. Blood pressure readings have improved and plans are to perform hemodialysis today. She is being set up as an acute dialysis outpatient at the moment and remains on prednisone.  9/26: Patient doing well this morning and continues to have left arm tenderness. She tolerated hemodialysis on 9/25 with 2 L removed. CK levels downtrending. She is hopeful that his urine output appears to be improving.  9/27:Patient appears to be doing well this morning and had 1.5 L of urine output yesterday. Laboratory data stable but she is developing some leukocytosis likely related to steroid use. Nephrology following.  9/28: Patient continues to remain on prednisone as recommended by neurology.  She is having good urine output and nephrology would like to follow and see if dialysis can be avoided.  9/29: Patient continues to remain on prednisone as recommended by neurology, but will decrease to 80mg  today. She will need to taper over weeks to months.  She is having great urine output and nephrology is following with plans to remove temporary dialysis catheter since she is improving, but would like to monitor for now.  9/30: Renal function has continued to trend  down and patient is making more urine.  At this point will discontinue tunnel hemodialysis  catheter and repeat renal function panel in a.m.; if remains stable most likely able to go home 09/28/2019 with home health services.  Assessment & Plan:   Principal Problem:   CAP (community acquired pneumonia) Active Problems:   Acute renal failure (ARF) (HCC)   Hypoxia   Alcohol abuse   Cocaine abuse (HCC)   Hyperkalemia   Metabolic acidosis   Hyperphosphatemia   Elevated troponin   Oliguria and anuria   1-hypoxia/community-acquired pneumonia-resolved -Afebrile -WBCs were slowly trending down; anticipated confusion information after initiation of steroids as recommended by neurology service. -Reports no chest pain -Continue flutter valve, good oxygen saturation on room air currently.  -Completed 5-day course of antibiotics with Rocephin  2-acute renal failure-anuric in setting of rhabdomyolysis, now improving -Continue avoiding nephrotoxic agents -Patient has good urine output and nephrology following for now with no plans for hemodialysis currently as she may not need acute hemodialysis chair on discharge -Femoral catheterdiscontinuedon 9/24 -Tunneled catheter placed on 9/24 and discontinue on 9/30 -Continue supportive care. -Follow renal function trend which is improving; if remains stable discharge home tomorrow 09/28/19 -Renal ultrasound without obstructive uropathy  3-history of alcohol abuse -no active withdrawal appreciated -Continue thiamine and folic acid.  4-tobacco abuse, cocaine abuse/marijuana use -Cessation counseling has been provided -Continue nicotine patch  5-right occipital stroke/concerns for myositis  -Patient presenting with left numbness affecting left side -Abnormal electrolytes and renal failure slightly contributed to left side weakness/numbness. -Follow results from dialysis trial -Continue electrolytes repletion as needed -Followin neurology service recommendations patient has been started on IV steroids. Outpatient and  electromyography recommended. -Will continue aspirin for secondary prevention and also statins -LDL of 80 and HDL 31; triglycerides level 241. -A1c 5.3. -Normal B12 level and elevated TSH. -CK level trending down  6-elevated TSH/hypothyroidism -Started on Synthroid and will recommend repeat thyroid profile in 4-6 weeks.  7-mixed hyperlipidemia -Hold Lipitor for now given rhabdomyolysis/elevated LFTs -Rhabdomyolysis improving  8-elevated troponin -No chest pain -No acute ischemic changes appreciated on telemetry or EKG -Reassuring 2D echo with normal ejection fraction and no wall motion abnormalities. -Patient's elevated troponin most likely associated with ongoing rhabdomyolysis and renal failure. -Continue statins and aspirin. -no further acute inpatient cardiac work up.  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 -Alcohol cessation counseling has been provided.  10-rhabdomyolysis likely secondary to cocaine use/alcohol use versus necrotizing myopathy-downtrending -Continue oral prednisone now on 80mg  daily.  -Apparently will need tapering over weeks to months with close follow up to Neurology outpatient. -She may also end requiring nerve conduction study and electromyography as an outpatient.  11-polysubstance abuse -Repeat urine toxicology positive for cocaine and marijuana -Cessation counseling has been provided.  12-worsening leukocytosis -This does not appear to be infectious and likely related to ongoing steroid use -WBC's trending down now. -afebrile and no source of infection present.    DVT prophylaxis:heparin  Code Status:Full code. Family Communication:none at bedside Disposition Plan:Continue close monitoring of renal function.Continue holding hemodialysis  for now.  If numbers continue improving most likely home tomorrow.  Consultants:  Nephrology service  Neurology  Cardiology service has been curbside (Dr. Bronson Ing)  General Surgery  Procedures:  See below for x-ray reports.  Antimicrobials:   None   Subjective: Afebrile, no chest pain, no nausea,  no vomiting.  Patient reports feeling stronger and reports increase in her urine output.  Objective: Vitals:   09/26/19 1555 09/26/19 2120 09/27/19 0504 09/27/19 1326  BP: 137/84 (!) 158/93 (!) 157/98 (!) 149/91  Pulse: 88 87 84 87  Resp: 20 16 16 18   Temp: 97.9 F (36.6 C) 98.5 F (36.9 C) 98.4 F (36.9 C) 98.4 F (36.9 C)  TempSrc: Oral Oral Oral Oral  SpO2: 100% 100% 100% 100%  Weight:      Height:        Intake/Output Summary (Last 24 hours) at 09/27/2019 1441 Last data filed at 09/27/2019 1100 Gross per 24 hour  Intake 960 ml  Output 2050 ml  Net -1090 ml   Filed Weights   09/22/19 1845 09/23/19 0507 09/24/19 0617  Weight: 81.5 kg 79.4 kg 81 kg    Examination: General exam: Alert, awake, oriented x 3; no fever, no chest pain, no nausea, no vomiting.  Patient reports increasing urine output and overall feeling stronger. Respiratory system: Clear to auscultation. Respiratory effort normal. Cardiovascular system:RRR. No murmurs, rubs, gallops. Gastrointestinal system: Abdomen is nondistended, soft and nontender. No organomegaly or masses felt. Normal bowel sounds heard. Central nervous system: Alert and oriented. No new focal neurological deficits. Extremities: No cyanosis, clubbing or edema.  Skin: No rashes, lesions or ulcers Psychiatry: Judgement and insight appear normal. Mood & affect appropriate.    Data Reviewed: I have personally reviewed following labs and imaging studies  CBC: Recent Labs  Lab 09/23/19 0724 09/24/19 0659 09/25/19 0624 09/26/19 0651 09/27/19 0518  WBC 20.2* 24.9* 31.6* 30.0* 27.5*  HGB 9.1* 8.6* 8.9* 8.6* 8.1*  HCT  26.5* 25.9* 26.5* 25.4* 24.3*  MCV 89.2 90.9 90.4 91.0 91.7  PLT 171 217 258 271 [id number]   Basic Metabolic Panel: Recent Labs  Lab 09/23/19 0724 09/24/19 0659 09/25/19 0624 09/26/19 0651 09/27/19 0518  NA 132* 133* 131* 133*  134* 136  K 3.7 4.2 3.7 3.8  4.0 4.0  CL 97* 96* 96* 98  98 99  CO2 26 27 22 24  25 25   GLUCOSE 123* 125* 120* 108*  108* 128*  BUN 41* 64* 79* 87*  88* 93*  CREATININE 4.54* 5.55* 5.66* 5.08*  5.10* 4.23*  CALCIUM 7.9* 8.2* 8.3* 8.6*  8.5* 8.7*  PHOS  --   --   --  5.5* 5.0*   GFR: Estimated Creatinine Clearance: 16.6 mL/min (A) (by C-G formula based on SCr of 4.23 mg/dL (H)).   Liver Function Tests: Recent Labs  Lab 09/22/19 0542 09/23/19 0724 09/24/19 0659 09/25/19 0624 09/26/19 0651 09/27/19 0518  AST 53* 42* 40 28 23  --   ALT 135* 89* 75* 59* 49*  --   ALKPHOS 61 52 57 57 53  --   BILITOT 0.8 0.9 0.8 0.6 0.6  --   PROT 5.0* 4.9* 5.0* 5.1* 5.1*  --   ALBUMIN 2.6* 2.5* 2.6* 2.7* 2.7*  2.7* 2.6*   Coagulation Profile: Recent Labs  Lab 09/21/19 0456  INR 1.2   Cardiac Enzymes: Recent Labs  Lab 09/23/19 0724 09/24/19 0659 09/25/19 0624 09/26/19 0651 09/27/19 0518  CKTOTAL 1,075* 659* 415* 261* 183   CBG: Recent Labs  Lab 09/26/19 1109 09/26/19 1559 09/26/19 2128 09/27/19 0723 09/27/19 1119  GLUCAP 139* 140* 190* 123* 118*    Radiology Studies: [place] Procedure Unlisted-no Report  Result Date: 09/27/2019 There is no Radiologist interpretation  for this exam.   Scheduled Meds: . amLODipine  10  mg Oral Daily  . aspirin EC  81 mg Oral Daily  . atorvastatin  20 mg Oral q1800  . Chlorhexidine Gluconate Cloth  6 each Topical Once   And  . Chlorhexidine Gluconate Cloth  6 each Topical Once  . Chlorhexidine Gluconate Cloth  6 each Topical Q0600  . feeding supplement (NEPRO CARB STEADY)  237 mL Oral Q24H  . folic acid  1 mg Oral Daily  . heparin injection (subcutaneous)  5,000 Units Subcutaneous Q8H  . insulin aspart   0-5 Units Subcutaneous QHS  . insulin aspart  0-9 Units Subcutaneous TID WC  . levothyroxine  50 mcg Oral Q0600  . multivitamin with minerals  1 tablet Oral Daily  . nicotine  14 mg Transdermal Daily  . pantoprazole  40 mg Oral Daily  . predniSONE  80 mg Oral Q breakfast  . sodium chloride flush  3 mL Intravenous Q12H  . thiamine  100 mg Oral Daily   Or  . thiamine  100 mg Intravenous Daily   Continuous Infusions: . sodium chloride       LOS: 13 days    Time spent: 30 minutes   Barton Dubois, MD [facility] Pager [phone]  09/27/2019, 2:41 PM

## 2019-09-27 NOTE — Progress Notes (Addendum)
Patient ID: Alicia Flowers, female   DOB: [birth year], 48 y.o.   MRN: [id number]   Rt IJ tunneled Hemodialysis catheter removal request Placed in IR 9/24  Renal recovery May remove per Dr Servando Salina  Skin cleansed with Chlora prep 10 cc 1% lidocaine injected to skin site Removal using sterile technique without complication Sterile dressing applied  Pt tolerated well  EBL: none

## 2019-09-27 NOTE — Progress Notes (Signed)
Patient ID: [name], female   DOB: 1971-08-03, 48 y.o.   MRN: [id number] S: Feeling better O:BP (!) 157/98 (BP Location: Right Arm)   Pulse 84   Temp 98.4 F (36.9 C) (Oral)   Resp 16   Ht 5\' 3"  (1.6 m)   Wt 81 kg   LMP 09/07/2019   SpO2 100%   BMI 31.63 kg/m   Intake/Output Summary (Last 24 hours) at 09/27/2019 0928 Last data filed at 09/27/2019 0900 Gross per 24 hour  Intake 1200 ml  Output 2150 ml  Net -950 ml   Intake/Output: I/O last 3 completed shifts: In: 1200 [P.O.:1200] Out: 3300 [Urine:3300]  Intake/Output this shift:  Total I/O In: 240 [P.O.:240] Out: -  Weight change:  Gen: NAD CVS: no rub Resp: cta Abd: +BS, soft, NT Ext: +edema, greatest in LUE   Recent Labs  Lab 09/21/19 0456 09/22/19 0542 09/23/19 0724 09/24/19 0659 09/25/19 0624 09/26/19 0651 09/27/19 0518  NA 130* 129* 132* 133* 131* 133*  134* 136  K 3.7 4.0 3.7 4.2 3.7 3.8  4.0 4.0  CL 97* 97* 97* 96* 96* 98  98 99  CO2 21* 21* 26 27 22 24  25 25   GLUCOSE 100* 89 123* 125* 120* 108*  108* 128*  BUN 77* 93* 41* 64* 79* 87*  88* 93*  CREATININE 6.86* 8.39* 4.54* 5.55* 5.66* 5.08*  5.10* 4.23*  ALBUMIN 2.6* 2.6* 2.5* 2.6* 2.7* 2.7*  2.7* 2.6*  CALCIUM 7.6* 7.8* 7.9* 8.2* 8.3* 8.6*  8.5* 8.7*  PHOS  --   --   --   --   --  5.5* 5.0*  AST 85* 53* 42* 40 28 23  --   ALT 209* 135* 89* 75* 59* 49*  --    Liver Function Tests: Recent Labs  Lab 09/24/19 0659 09/25/19 0624 09/26/19 0651 09/27/19 0518  AST 40 28 23  --   ALT 75* 59* 49*  --   ALKPHOS 57 57 53  --   BILITOT 0.8 0.6 0.6  --   PROT 5.0* 5.1* 5.1*  --   ALBUMIN 2.6* 2.7* 2.7*  2.7* 2.6*   No results for input(s): LIPASE, AMYLASE in the last 168 hours. No results for input(s): AMMONIA in the last 168 hours. CBC: Recent Labs  Lab 09/23/19 0724 09/24/19 0659 09/25/19 0624 09/26/19 0651 09/27/19 0518  WBC 20.2* 24.9* 31.6* 30.0* 27.5*  HGB 9.1* 8.6* 8.9* 8.6* 8.1*  HCT 26.5* 25.9* 26.5* 25.4* 24.3*   MCV 89.2 90.9 90.4 91.0 91.7  PLT 171 217 258 271 279   Cardiac Enzymes: Recent Labs  Lab 09/23/19 0724 09/24/19 0659 09/25/19 0624 09/26/19 0651 09/27/19 0518  CKTOTAL 1,075* 659* 415* 261* 183   CBG: Recent Labs  Lab 09/26/19 0737 09/26/19 1109 09/26/19 1559 09/26/19 2128 09/27/19 0723  GLUCAP 99 139* 140* 190* 123*    Iron Studies: No results for input(s): IRON, TIBC, TRANSFERRIN, FERRITIN in the last 72 hours. Studies/Results: No results found. Marland Kitchen amLODipine  10 mg Oral Daily  . aspirin EC  81 mg Oral Daily  . atorvastatin  20 mg Oral q1800  . Chlorhexidine Gluconate Cloth  6 each Topical Once   And  . Chlorhexidine Gluconate Cloth  6 each Topical Once  . Chlorhexidine Gluconate Cloth  6 each Topical Q0600  . feeding supplement (NEPRO CARB STEADY)  237 mL Oral Q24H  . folic acid  1 mg Oral Daily  . insulin aspart  0-5 Units  Subcutaneous QHS  . insulin aspart  0-9 Units Subcutaneous TID WC  . levothyroxine  50 mcg Oral Q0600  . multivitamin with minerals  1 tablet Oral Daily  . nicotine  14 mg Transdermal Daily  . pantoprazole  40 mg Oral Daily  . predniSONE  80 mg Oral Q breakfast  . sodium chloride flush  3 mL Intravenous Q12H  . thiamine  100 mg Oral Daily   Or  . thiamine  100 mg Intravenous Daily    BMET    Component Value Date/Time   NA 136 09/27/2019 0518   K 4.0 09/27/2019 0518   CL 99 09/27/2019 0518   CO2 25 09/27/2019 0518   GLUCOSE 128 (H) 09/27/2019 0518   BUN 93 (H) 09/27/2019 0518   CREATININE 4.23 (H) 09/27/2019 0518   CALCIUM 8.7 (L) 09/27/2019 0518   GFRNONAA 12 (L) 09/27/2019 0518   GFRAA 14 (L) 09/27/2019 0518   CBC    Component Value Date/Time   WBC 27.5 (H) 09/27/2019 0518   RBC 2.65 (L) 09/27/2019 0518   HGB 8.1 (L) 09/27/2019 0518   HCT 24.3 (L) 09/27/2019 0518   PLT 279 09/27/2019 0518   MCV 91.7 09/27/2019 0518   MCH 30.6 09/27/2019 0518   MCHC 33.3 09/27/2019 0518   RDW 15.9 (H) 09/27/2019 0518   LYMPHSABS 0.6  (L) 09/17/2019 0042   MONOABS 0.4 09/17/2019 0042   EOSABS 0.0 09/17/2019 0042   BASOSABS 0.0 09/17/2019 0042    Assessment/Plan:  1. AKIdue to rhabdomyolysis (related to cocaine and Etoh). Had been on HD for the past week but has started to show increase in UOP. Last HD was 09/22/19 with improvement of Scr from 8.39 to 4.45 but with gradual rise since and peaked at 5.66 09/25/19 but down to 5.1 without HD. No uremic symptoms and CPK levels down to 415-->216.  1. Continue to hold off on HD as she is showing signs of continued renal recovery. 2. Will consult IR to remove TDC today 3. If creatinine continues to improve tomorrow, she will be stable for discharge with f/u with her PCP and also with our office in 2-3 weeks 2. Rhabdomyolysis- presumably due to cocaine/alcohol and possible loc.  3. Right occipital infarct- per neuro 4. Left arm weakness- per neuro 5. Polysubstance abuse- SW consulted and would benefit from outpatient rehab 6. Abnormal EKC with partial RBBB felt to be due to cocaine 7. HTN - stable 8. Anemia of acute illness- cont to follow and transfuse prn 9. CAP/sepsis- improved with abx 10. Disposition- if Scr continues to improve tomorrow she will be ok for discharge with outpatient follow up.   She will also need outpatient rehab for his polysubstance abuse  Donetta Potts, MD [employer] [phone]

## 2019-09-28 DIAGNOSIS — I639 Cerebral infarction, unspecified: Secondary | ICD-10-CM

## 2019-09-28 DIAGNOSIS — R778 Other specified abnormalities of plasma proteins: Secondary | ICD-10-CM

## 2019-09-28 LAB — RENAL FUNCTION PANEL
Albumin: 2.7 g/dL — ABNORMAL LOW (ref 3.5–5.0)
Anion gap: 13 (ref 5–15)
BUN: 90 mg/dL — ABNORMAL HIGH (ref 6–20)
CO2: 24 mmol/L (ref 22–32)
Calcium: 8.9 mg/dL (ref 8.9–10.3)
Chloride: 100 mmol/L (ref 98–111)
Creatinine, Ser: 3.41 mg/dL — ABNORMAL HIGH (ref 0.44–1.00)
GFR calc Af Amer: 18 mL/min — ABNORMAL LOW (ref >60)
GFR calc non Af Amer: 15 mL/min — ABNORMAL LOW (ref >60)
Glucose, Bld: 106 mg/dL — ABNORMAL HIGH (ref 70–99)
Phosphorus: 4.1 mg/dL (ref 2.5–4.6)
Potassium: 4 mmol/L (ref 3.5–5.1)
Sodium: 137 mmol/L (ref 135–145)

## 2019-09-28 LAB — GLUCOSE, CAPILLARY
Glucose-Capillary: 100 mg/dL — ABNORMAL HIGH (ref 70–99)
Glucose-Capillary: 120 mg/dL — ABNORMAL HIGH (ref 70–99)
Glucose-Capillary: 159 mg/dL — ABNORMAL HIGH (ref 70–99)

## 2019-09-28 MED ORDER — NICOTINE 14 MG/24HR TD PT24: 14.0000 mg | MEDICATED_PATCH | Freq: Every day | TRANSDERMAL | 2 refills | Status: DC

## 2019-09-28 MED ORDER — ATORVASTATIN CALCIUM 20 MG PO TABS: 20.0000 mg | ORAL_TABLET | Freq: Every day | ORAL | 3 refills | Status: DC

## 2019-09-28 MED ORDER — ASPIRIN 81 MG PO TBEC: 81.0000 mg | DELAYED_RELEASE_TABLET | Freq: Every day | ORAL | 3 refills | Status: DC

## 2019-09-28 MED ORDER — AMLODIPINE BESYLATE 10 MG PO TABS: 10.0000 mg | ORAL_TABLET | Freq: Every day | ORAL | 3 refills | Status: DC

## 2019-09-28 MED ORDER — PANTOPRAZOLE SODIUM 40 MG PO TBEC: 40.0000 mg | DELAYED_RELEASE_TABLET | Freq: Every day | ORAL | 2 refills | Status: DC

## 2019-09-28 MED ORDER — PREDNISONE 20 MG PO TABS: 80.0000 mg | ORAL_TABLET | Freq: Every day | ORAL | 2 refills | Status: DC

## 2019-09-28 MED ORDER — LEVOTHYROXINE SODIUM 50 MCG PO TABS: 50.0000 ug | ORAL_TABLET | Freq: Every day | ORAL | 2 refills | Status: DC

## 2019-09-28 MED ORDER — FOLIC ACID 1 MG PO TABS: 1.0000 mg | ORAL_TABLET | Freq: Every day | ORAL | 2 refills | Status: DC

## 2019-09-28 NOTE — Progress Notes (Signed)
Physical Therapy Treatment Patient Details Name: Alicia Flowers MRN: [id number] DOB: June 24, 1971 Today's Date: 09/28/2019    History of Present Illness Patient is a 48 year old female admitted 09/14/2019 with diagnosis of CAP. PMH:  tobacco abuse, alcohol abuse, cocaine abuse and marijuana use. Numbness and weakness in L arm past couple of months. CVA (+).    PT Comments     pt eager to get out to bed.  No assistance needed from therapist, however pt slow to complete tasks.  Pt insisted on using slip-on slippers.  Educated that these are not "safe" as they did not have closed heels and recommended getting additional shoes with full fit on foot.  Pt verbalized understanding.  PT able to ambulate full distance of 150 feet without any distress or gait deviations.  Pt did use a walker this session and would recommend trying on SPC or no AD at next visit.  Otherwise, pt safe and near baseline at this point.             Precautions / Restrictions Precautions Precautions: Fall    Mobility  Bed Mobility Overal bed mobility: Modified Independent       Supine to sit: Modified independent (Device/Increase time)     General bed mobility comments: increased time but no assistance from therapist required  Transfers Overall transfer level: Modified independent Equipment used: Rolling walker (2 wheeled) Transfers: Sit to/from Stand   Stand pivot transfers: Modified independent (Device/Increase time)       General transfer comment: supervised for safety  Ambulation/Gait Ambulation/Gait assistance: Supervision Gait Distance (Feet): 150 Feet Assistive device: Rolling walker (2 wheeled)                 Cognition Arousal/Alertness: Awake/alert Behavior During Therapy: WFL for tasks assessed/performed Overall Cognitive Status: Within Functional Limits for tasks assessed                                               Pertinent Vitals/Pain Pain Assessment:  No/denies pain           PT Goals (current goals can now be found in the care plan section) Acute Rehab PT Goals Patient Stated Goal: return home with family to assist       PT Plan  if not discharged today, attempt gait without AD next session       AM-PAC PT "6 Clicks" Mobility   Outcome Measure  Help needed turning from your back to your side while in a flat bed without using bedrails?: None Help needed moving from lying on your back to sitting on the side of a flat bed without using bedrails?: None Help needed moving to and from a bed to a chair (including a wheelchair)?: None Help needed standing up from a chair using your arms (e.g., wheelchair or bedside chair)?: A Little Help needed to walk in hospital room?: A Little Help needed climbing 3-5 steps with a railing? : A Little 6 Click Score: 21    End of Session Equipment Utilized During Treatment: Gait belt Activity Tolerance: Patient tolerated treatment well Patient left: with call bell/phone within reach;in chair   PT Visit Diagnosis: Unsteadiness on feet (R26.81);Other abnormalities of gait and mobility (R26.89);Muscle weakness (generalized) (M62.81)     Time: [id number] PT Time Calculation (min) (ACUTE ONLY): 13 min  Charges:  $Gait Training: 8-22 mins  Teena Irani, PTA/CLT [phone]    Roseanne Reno B 09/28/2019, 1:19 PM

## 2019-09-28 NOTE — Plan of Care (Signed)

## 2019-09-28 NOTE — Discharge Summary (Signed)
Physician Discharge Summary  Alicia Flowers OIN:[id number] DOB: January 21, 1971 DOA: 09/14/2019  PCP: Patient, No Pcp Per  Admit date: 09/14/2019 Discharge date: 09/28/2019  Time spent: 35 minutes  Recommendations for Outpatient Follow-up:  1. Repeat basic metabolic panel to follow electrolytes renal function 2. Repeat TSH in 4-6 weeks and further adjust Synthroid therapy as needed 3. Repeat CBC to follow WBCs trend   Discharge Diagnoses:  Principal Problem:   CAP (community acquired pneumonia) Active Problems:   Acute renal failure (ARF) (HCC)   Hypoxia   Alcohol abuse   Cocaine abuse (HCC)   Hyperkalemia   Metabolic acidosis   Hyperphosphatemia   Elevated troponin   Oliguria and anuria   Hypocalcemia   Ischemic stroke [facility])   Discharge Condition: Stable and improved.  Patient discharged home with instruction to follow-up with PCP, neurology and nephrology as an outpatient.  Home health services have been arranged to provide physical therapy at home.  Diet recommendation: Heart healthy diet  Filed Weights   09/22/19 1845 09/23/19 0507 09/24/19 0617  Weight: 81.5 kg 79.4 kg 81 kg    History of present illness:  48 y.o.femalewith a medical history significant for tobacco abuse, alcohol abuse, cocaine abuse and marijuana use;who presented to the hospital secondary to worsening numbness in her left upper extremity and left leg. Patient reports his symptoms have been present for approximately 3 months but worsening in the last 2 to 3 days prior to admission. Patient also expressed feeling slightly more tired and having decrease oral intake.No chest pain, no nausea, no vomiting, no productive cough, no sick contacts. Patient reports to continue smoking about half pack per day and is drinking on daily basis. Last use of marijuana was the night prior to admission.  In the ED patient was found to be mildly hypoxic, with elevated WBCs, acute renal failure with a creatinine  close to 5, hyperkalemic, hypophosphatemic with a normal radiologic studies suggesting community-acquired pneumonia. She also has a positive MRI demonstrating right occipital stroke. Cultures were taken, sodium bicarbonate, insulin and calcium gluconate provided for hyperkalemia.Patient is started on IV antibiotics and [facility] has been contacted to admit patient for further evaluation and management.  Hospital Course:  1-hypoxia/community-acquired pneumonia-resolved -Afebrile -WBCs were slowly trending down; anticipated confusion information after initiation of steroids as recommended by neurology service. -Reports no chest pain -Continue flutter valve, good oxygen saturation on room air currently.  -Completed 5-day course of antibiotics with Rocephin  2-acute renal failure-anuric in setting of rhabdomyolysis, now improving -Continue avoiding nephrotoxic agents -Patient has good urine output and nephrology following for now with no plans for hemodialysis currently as she may not need acute hemodialysis chair on discharge -Femoral catheterdiscontinuedon 9/24 -Tunneled catheter placed on 9/24 and discontinue on 9/30 -Continue adequate oral hydration -Creatinine at discharge 3.4; patient will follow-up with nephrology as an outpatient and will establish care with PCP to further assure renal function and stability. -Renal ultrasound without obstructive uropathy  3-history of alcohol abuse -no active withdrawal appreciated -Continue thiamine and folic acid.  4-tobacco abuse -Cessation counseling has been provided -Continue nicotine patch  5-right occipital stroke/concerns for myositis  -Patient presenting with left numbness affecting left side -Abnormal electrolytes and renal failure slightly contributed to left side weakness/numbness. -Follow results from dialysis trial -Continue electrolytes repletion as needed -Followin neurology service recommendations patient has been started  on IV steroids. Outpatient and electromyography recommended. -Will continue aspirin for secondary prevention and also statins -LDL of 80 and HDL 31; triglycerides  level 241. -A1c 5.3. -Normal B12 level and elevated TSH. -CK level trending down  6-elevated TSH/hypothyroidism -Started on Synthroid and will recommend repeat thyroid profile in 4-6 weeks.  7-mixed hyperlipidemia -discharge on lipitor  8-elevated troponin -No chest pain -No acute ischemic changes appreciated on telemetry or EKG -Reassuring 2D echo with normal ejection fraction and no wall motion abnormalities. -Patient's elevated troponin most likely associated with ongoing rhabdomyolysis and renal failure. -Continue statins and aspirin. -no further acute inpatient cardiac work up.  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 -Alcohol cessation counseling has been provided.  10-rhabdomyolysis likely secondary to cocaine use/alcohol use versus necrotizing myopathy-downtrending -Continue oral prednisone now on 32m daily, with anticipated a slow tapering.  -Apparently will need tapering over weeks to months with close follow up to Neurology outpatient (follow-up with Dr. DMerlene Laughterin 10 days). -She may also end requiring nerve conduction study and electromyography as an outpatient.  11-polysubstance abuse -Repeat urine toxicology positive for cocaine and marijuana -Cessation counseling has been provided.  12-worsening leukocytosis -This does not appear to be infectious and likely related to ongoing steroid use -WBC's trending down now. -afebrile and no source of infection present at this time.  -Repeat CBC at follow-up visit to assess WBCs trend.  13-physical deconditioning -Home health PT has been arranged at  discharge -DME rolling walker provided.  Procedures: See below for x-ray reports Inpatient intermittent hemodialysis treatment provided.  Consultations:  Nephrology service  Neurology  Cardiology service curbside (Dr. KBronson Ing.  Discharge Exam: Vitals:   09/27/19 2150 09/28/19 0542  BP: (!) 161/93 (!) 148/99  Pulse: 95 88  Resp: 20 16  Temp: 98.5 F (36.9 C) 98.8 F (37.1 C)  SpO2: 100% 100%   General exam: Alert, awake, oriented x 3; no fever, no chest pain, no nausea, no vomiting.  Patient reported to continue having good urine output and overall feeling better and ready to go home.   Respiratory system: Clear to auscultation. Respiratory effort normal. Cardiovascular system:RRR. No murmurs, rubs, gallops. Gastrointestinal system: Abdomen is nondistended, soft and nontender. No organomegaly or masses felt. Normal bowel sounds heard. Central nervous system: Alert and oriented. No new focal neurological deficits. Extremities: No cyanosis, clubbing or edema on exam.  Skin: No rashes, lesions or ulcers Psychiatry: Judgement and insight appear normal. Mood & affect appropriate.    Discharge Instructions   Discharge Instructions    Diet - low sodium heart healthy   Complete by: As directed    Discharge instructions   Complete by: As directed    Medications as prescribed Avoid the use of alcohol, tobacco abuse or recreational drugs. Maintain adequate hydration Follow heart healthy diet Follow-up with neurology service in 2 weeks Outpatient follow-up with PCP to establish care as instructed.     Allergies as of 09/28/2019   No Known Allergies     Medication List    STOP taking these medications   Aleve 220 MG tablet Generic drug: naproxen sodium   aspirin 325 MG tablet Replaced by: aspirin 81 MG EC tablet   diphenoxylate-atropine 2.5-0.025 MG tablet Commonly known as: Lomotil   naphazoline-glycerin 0.012-0.2 % Soln Commonly known as: CLEAR EYES  REDNESS     TAKE these medications   amLODipine 10 MG tablet Commonly known as: NORVASC Take 1 tablet (10 mg total) by mouth daily. Start taking on: September 29, 2019   aspirin 81 MG EC tablet Take 1 tablet (81 mg total) by mouth daily. Start taking on: September 29, 2019 Replaces: aspirin 325 MG tablet   atorvastatin 20 MG tablet Commonly known as: LIPITOR Take 1 tablet (20 mg total) by mouth daily at 6 PM.   folic acid 1 MG tablet Commonly known as: FOLVITE Take 1 tablet (1 mg total) by mouth daily. Start taking on: September 29, 2019   levothyroxine 50 MCG tablet Commonly known as: SYNTHROID Take 1 tablet (50 mcg total) by mouth daily at 6 (six) AM. Start taking on: September 29, 2019   nicotine 14 mg/24hr patch Commonly known as: NICODERM CQ - dosed in mg/24 hours Place 1 patch (14 mg total) onto the skin daily. Start taking on: September 29, 2019   pantoprazole 40 MG tablet Commonly known as: PROTONIX Take 1 tablet (40 mg total) by mouth daily. Start taking on: September 29, 2019   predniSONE 20 MG tablet Commonly known as: DELTASONE Take 4 tablets (80 mg total) by mouth daily with breakfast. Start taking on: September 29, 2019            Durable Medical Equipment  (From admission, onward)         Start     Ordered   09/28/19 1613  For home use only DME Walker rolling  Once    Question:  Patient needs a walker to treat with the following condition  Answer:  Physical deconditioning   09/28/19 1612         No Known Allergies Follow-up Information    Schedule an appointment as soon as possible for a visit  with [facility].   Specialty: Pediatrics Contact information: [place] [address] [phone]        Phillips Odor, MD. Schedule an appointment as soon as possible for a visit in 10 day(s).   Specialty: Neurology Contact information: [address] [place] [address] [phone]           The  results of significant diagnostics from this hospitalization (including imaging, microbiology, ancillary and laboratory) are listed below for reference.    Significant Diagnostic Studies: Ct Abdomen Pelvis Wo Contrast  Result Date: 09/14/2019 CLINICAL DATA:  Abdominal distension with elevated liver enzymes EXAM: CT ABDOMEN AND PELVIS WITHOUT CONTRAST TECHNIQUE: Multidetector CT imaging of the abdomen and pelvis was performed following the standard protocol without oral or IV contrast. COMPARISON:  None. FINDINGS: Lower chest: There is airspace consolidation in both lower lobes posteriorly. There is a degree of lower lobe bronchiectatic change is well. Hepatobiliary: No focal liver lesions are evident on this noncontrast enhanced study. The gallbladder wall is not appreciably thickened. There is no biliary duct dilatation. Pancreas: There is no pancreatic mass or inflammatory focus. Spleen: No splenic lesions are evident. Adrenals/Urinary Tract: Adrenals bilaterally appear unremarkable. Kidneys bilaterally show no evident mass or hydronephrosis on either side. There is no evident renal or ureteral calculus on either side. Urinary bladder is midline with wall thickness within normal limits for degree of distention. Stomach/Bowel: The stomach is diffusely distended with air. There is no appreciable bowel wall or mesenteric thickening. No obstructing focus evident. The terminal ileum appears unremarkable. Vascular/Lymphatic: There is no abdominal aortic aneurysm. There is aortic and iliac artery atherosclerotic calcification. There is no adenopathy evident in the abdomen or pelvis. Reproductive: Uterus is anteverted.  No pelvic mass is demonstrated. Other: Appendix absent. No periappendiceal region inflammation.  No abscess or ascites is evident in the abdomen or pelvis. There is postoperative change in the anterior abdominal wall with several clips present. Musculoskeletal: There are foci of degenerative change in  the lumbar spine. 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 No sacroiliitis evident. No blastic or lytic bone lesions. No intramuscular lesions evident. IMPRESSION: 1. Airspace consolidation consistent with pneumonia in each posterior lower lobe region. There is a degree of bilateral lower lobe bronchiectasis as well. 2. Stomach distended with air. Gastric wall does not appear appreciably thickened. No small or large bowel distention evident. Question a degree of ileus from the pneumonia causing the gastric distention. 3. No evident bowel obstruction. No abscess in the abdomen or pelvis. Appendix absent. No periappendiceal region inflammatory change. 4.  Aortic and iliac artery atherosclerosis noted. 5. Osteitis condensans ilia bilaterally, a potential source for abdominal pain. Electronically Signed   By: Lowella Grip III M.D.   On: 09/14/2019 11:53   Dg Elbow 2 Views Left  Result Date: 09/15/2019 CLINICAL DATA:  Pain and swelling, stroke, unknown injury EXAM: LEFT SHOULDER - 2+ VIEW; LEFT ELBOW - 2 VIEW COMPARISON:  None. FINDINGS: No fracture or dislocation of the left shoulder. The joint spaces are well preserved. The partially imaged left chest is unremarkable. No fracture or dislocation of the left elbow. Joint spaces are well preserved. No elbow joint effusion. Diffuse soft tissue edema about the elbow. IMPRESSION: 1. No fracture or dislocation of the left shoulder. The joint spaces are well preserved. 2. No fracture or dislocation of the left elbow. Joint spaces are well preserved. No elbow joint effusion. Diffuse soft tissue edema about the elbow. Electronically Signed   By: Eddie Candle M.D.   On: 09/15/2019 21:50   Mr Brain Wo Contrast  Result Date: 09/14/2019 CLINICAL DATA:  Numbness of the left arm over the last 3 months. EXAM: MRI HEAD WITHOUT CONTRAST TECHNIQUE: Multiplanar, multiecho pulse sequences of the brain and surrounding structures were obtained without intravenous contrast.  COMPARISON:  None. 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 Otherwise, the brain does not show any old or acute small or large vessel insult. No mass lesion, hemorrhage, hydrocephalus or extra-axial collection. Vascular: Major vessels at the base of the brain show flow. This includes the posterior circulation vessels. I do not see convincing evidence venous thrombosis. There is an arachnoid granulation at the right transverse sinus sigmoid sinus junction. 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 Otherwise negative study. Etiology indeterminate. There appears to be flow in the posterior circulation main vessels. I do not see clear evidence of venous thrombosis on this noncontrast study. One could consider CT or MR venography. Electronically Signed   By: Nelson Chimes M.D.   On: 09/14/2019 10:11   Mr Cervical Spine Wo Contrast  Result Date: 09/14/2019 CLINICAL DATA:  Left arm weakness for 3 months EXAM: MRI CERVICAL SPINE WITHOUT CONTRAST TECHNIQUE: Multiplanar, multisequence MR imaging of the cervical spine was performed. No intravenous contrast was administered. COMPARISON:  None. FINDINGS: The study is limited due to patient motion. Alignment: There is straightening of the normal cervical lordosis. Vertebrae: The vertebral body heights are well maintained. No fracture, marrow edema,or pathologic marrow infiltration. Cord: Appears to be normal in signal intensity, however limited due to patient motion. Posterior Fossa, vertebral arteries, paraspinal tissues: The visualized portion of the posterior fossa is unremarkable. Normal flow  voids seen within the vertebral arteries. The paraspinal soft tissues are unremarkable. Disc levels: C1-C2: Atlanto-axial junction is normal, without canal narrowing C2-C3: No significant spinal canal or neural foraminal  narrowing C3-C4: There is a minimal disc osteophyte complex, however no significant canal or neural foraminal narrowing. C4-C5: There is a disc osteophyte complex and uncovertebral osteophytes which causes mild left and mild-to-moderate right neural foraminal narrowing. C5-C6: There is a disc osteophyte complex which causes mild bilateral neural foraminal narrowing. C6-C7: Disc osteophyte complex which causes mild right neural foraminal narrowing. C7-T1: No significant spinal canal or neural foraminal narrowing IMPRESSION: The study is limited due to patient motion. Mild lumbar spine spondylosis as described above this is most notable C4-C5 with mild-to-moderate right neural foraminal narrowing. Electronically Signed   By: Prudencio Pair M.D.   On: 09/14/2019 10:02   US Renal  Result Date: 09/14/2019 CLINICAL DATA:  Acute kidney injury. EXAM: RENAL / URINARY TRACT ULTRASOUND COMPLETE COMPARISON:  None. FINDINGS: Right Kidney: Renal measurements: 11.9 x 4.8 x 4.8 cm = volume: 149 mL. Minimally increased echogenicity of renal parenchyma is noted. No mass or hydronephrosis visualized. Left Kidney: Renal measurements: 12.4 x 5.4 x 4.8 cm = volume: 171 mL. Minimally increased echogenicity of renal parenchyma is noted. No mass or hydronephrosis visualized. Bladder: Not visualized due to body habitus. IMPRESSION: Minimally increased echogenicity of renal parenchyma is noted bilaterally suggesting medical renal disease. No hydronephrosis or renal obstruction is noted. Bladder is not visualized. Electronically Signed   By: Marijo Conception M.D.   On: 09/14/2019 13:28   US Carotid Bilateral  Result Date: 09/14/2019 CLINICAL DATA:  48 year old female with a history of TIA EXAM: BILATERAL CAROTID DUPLEX ULTRASOUND TECHNIQUE: Pearline Cables scale imaging, color Doppler and duplex ultrasound were performed of bilateral carotid and vertebral arteries in the neck. COMPARISON:  None. FINDINGS: Criteria: Quantification of carotid stenosis  is based on velocity parameters that correlate the residual internal carotid diameter with NASCET-based stenosis levels, using the diameter of the distal internal carotid lumen as the denominator for stenosis measurement. The following velocity measurements were obtained: RIGHT ICA:  Systolic 161 cm/sec, Diastolic 51 cm/sec CCA:  096 cm/sec SYSTOLIC ICA/CCA RATIO:  1.8 ECA:  99 cm/sec LEFT ICA:  Systolic 99 cm/sec, Diastolic 52 cm/sec CCA:  045 cm/sec SYSTOLIC ICA/CCA RATIO:  1.5 ECA: NA Right Brachial SBP: Not acquired Left Brachial SBP: Not acquired RIGHT CAROTID ARTERY: No significant calcified disease of the right common carotid artery. Intermediate waveform maintained. Heterogeneous plaque without significant calcifications at the right carotid bifurcation. Low resistance waveform of the right ICA. No significant tortuosity. RIGHT VERTEBRAL ARTERY: Antegrade flow with low resistance waveform. LEFT CAROTID ARTERY: No significant calcified disease of the left common carotid artery. Intermediate waveform maintained. Heterogeneous plaque at the left carotid bifurcation without significant calcifications. Low resistance waveform of the left ICA. LEFT VERTEBRAL ARTERY:  Antegrade flow with low resistance waveform. IMPRESSION: Color duplex indicates minimal heterogeneous plaque, with no hemodynamically significant stenosis by duplex criteria in the extracranial cerebrovascular circulation. Signed, Dulcy Fanny. Dellia Nims, RPVI [facility] Electronically Signed   By: Corrie Mckusick D.O.   On: 09/14/2019 15:28   US Venous Img Upper Uni Left  Result Date: 09/17/2019 CLINICAL DATA:  Edema, pain EXAM: LEFT UPPER EXTREMITY VENOUS DOPPLER ULTRASOUND TECHNIQUE: Gray-scale sonography with graded compression, as well as color Doppler and duplex ultrasound were performed to evaluate the upper extremity deep venous system from the level of the  subclavian vein and including  the jugular, axillary, basilic, radial, ulnar and upper cephalic vein. Spectral Doppler was utilized to evaluate flow at rest and with distal augmentation maneuvers. COMPARISON:  None. FINDINGS: Contralateral Subclavian Vein: Respiratory phasicity is normal and symmetric with the symptomatic side. No evidence of thrombus. Normal compressibility. Internal Jugular Vein: No evidence of thrombus. Normal compressibility, respiratory phasicity and response to augmentation. Subclavian Vein: No evidence of thrombus. Normal compressibility, respiratory phasicity and response to augmentation. Axillary Vein: No evidence of thrombus. Normal compressibility, respiratory phasicity and response to augmentation. Cephalic Vein: No evidence of thrombus. Normal compressibility, respiratory phasicity and response to augmentation. Basilic Vein: No evidence of thrombus. Normal compressibility, respiratory phasicity and response to augmentation. Brachial Veins: No evidence of thrombus. Normal compressibility, respiratory phasicity and response to augmentation. Radial Veins: No evidence of thrombus. Normal compressibility, respiratory phasicity and response to augmentation. Ulnar Veins: No evidence of thrombus. Normal compressibility, respiratory phasicity and response to augmentation. Venous Reflux:  None visualized. Other Findings:  Subcutaneous edema noted in the forearm. IMPRESSION: No evidence of DVT within the left upper extremity. Electronically Signed   By: Lucrezia Europe M.D.   On: 09/17/2019 11:27   Ir Fluoro Guide Cv Line Right  Result Date: 09/21/2019 INDICATION: End-stage renal disease. In need of durable intravenous access for the initiation of dialysis. EXAM: TUNNELED CENTRAL VENOUS HEMODIALYSIS CATHETER PLACEMENT WITH ULTRASOUND AND FLUOROSCOPIC GUIDANCE MEDICATIONS: Ancef 2 gm IV . The antibiotic was given in an appropriate time interval prior to skin puncture. ANESTHESIA/SEDATION: Versed 0.5 mg IV; Fentanyl 25 mcg IV;  Moderate Sedation Time:  17 minutes The patient was continuously monitored during the procedure by the interventional radiology nurse under my direct supervision. FLUOROSCOPY TIME:  54 seconds (6 mGy) COMPLICATIONS: None immediate. PROCEDURE: Informed written consent was obtained from the patient after a discussion of the risks, benefits, and alternatives to treatment. Questions regarding the procedure were encouraged and answered. The right neck and chest were prepped with chlorhexidine in a sterile fashion, and a sterile drape was applied covering the operative field. Maximum barrier sterile technique with sterile gowns and gloves were used for the procedure. A timeout was performed prior to the initiation of the procedure. After creating a small venotomy incision, a micropuncture kit was utilized to access the internal jugular vein. Real-time ultrasound guidance was utilized for vascular access including the acquisition of a permanent ultrasound image documenting patency of the accessed vessel. The microwire was utilized to measure appropriate catheter length. A stiff Glidewire was advanced to the level of the IVC and the micropuncture sheath was exchanged for a peel-away sheath. A palindrome tunneled hemodialysis catheter measuring 19 cm from tip to cuff was tunneled in a retrograde fashion from the anterior chest wall to the venotomy incision. The catheter was then placed through the peel-away sheath with tips ultimately positioned within the superior aspect of the right atrium. Final catheter positioning was confirmed and documented with a spot radiographic image. The catheter aspirates and flushes normally. The catheter was flushed with appropriate volume heparin dwells. The catheter exit site was secured with a 0-Prolene retention suture. The venotomy incision was closed with an interrupted 4-0 Vicryl, Dermabond and Steri-strips. Dressings were applied. The patient tolerated the procedure well without  immediate post procedural complication. IMPRESSION: Successful placement of 19 cm tip to cuff tunneled hemodialysis catheter via the right internal jugular vein with tips terminating within the superior aspect of the right atrium. The catheter is ready for immediate use. Electronically  Signed   By: Sandi Mariscal M.D.   On: 09/21/2019 10:53   Ir US Guide Vasc Access Right  Result Date: 09/21/2019 INDICATION: End-stage renal disease. In need of durable intravenous access for the initiation of dialysis. EXAM: TUNNELED CENTRAL VENOUS HEMODIALYSIS CATHETER PLACEMENT WITH ULTRASOUND AND FLUOROSCOPIC GUIDANCE MEDICATIONS: Ancef 2 gm IV . The antibiotic was given in an appropriate time interval prior to skin puncture. ANESTHESIA/SEDATION: Versed 0.5 mg IV; Fentanyl 25 mcg IV; Moderate Sedation Time:  17 minutes The patient was continuously monitored during the procedure by the interventional radiology nurse under my direct supervision. FLUOROSCOPY TIME:  54 seconds (6 mGy) COMPLICATIONS: None immediate. PROCEDURE: Informed written consent was obtained from the patient after a discussion of the risks, benefits, and alternatives to treatment. Questions regarding the procedure were encouraged and answered. The right neck and chest were prepped with chlorhexidine in a sterile fashion, and a sterile drape was applied covering the operative field. Maximum barrier sterile technique with sterile gowns and gloves were used for the procedure. A timeout was performed prior to the initiation of the procedure. After creating a small venotomy incision, a micropuncture kit was utilized to access the internal jugular vein. Real-time ultrasound guidance was utilized for vascular access including the acquisition of a permanent ultrasound image documenting patency of the accessed vessel. The microwire was utilized to measure appropriate catheter length. A stiff Glidewire was advanced to the level of the IVC and the micropuncture sheath was  exchanged for a peel-away sheath. A palindrome tunneled hemodialysis catheter measuring 19 cm from tip to cuff was tunneled in a retrograde fashion from the anterior chest wall to the venotomy incision. The catheter was then placed through the peel-away sheath with tips ultimately positioned within the superior aspect of the right atrium. Final catheter positioning was confirmed and documented with a spot radiographic image. The catheter aspirates and flushes normally. The catheter was flushed with appropriate volume heparin dwells. The catheter exit site was secured with a 0-Prolene retention suture. The venotomy incision was closed with an interrupted 4-0 Vicryl, Dermabond and Steri-strips. Dressings were applied. The patient tolerated the procedure well without immediate post procedural complication. IMPRESSION: Successful placement of 19 cm tip to cuff tunneled hemodialysis catheter via the right internal jugular vein with tips terminating within the superior aspect of the right atrium. The catheter is ready for immediate use. Electronically Signed   By: Sandi Mariscal M.D.   On: 09/21/2019 10:53   Dg Chest Port 1 View  Result Date: 09/14/2019 CLINICAL DATA:  Hypoxia EXAM: PORTABLE CHEST 1 VIEW COMPARISON:  06/25/2013 FINDINGS: Cardiomegaly accentuated by low volumes. Diffuse interstitial prominence. No Kerley lines, air bronchogram, effusion or pneumothorax. Gaseous distention of the stomach. IMPRESSION: 1. Cardiomegaly accentuated by low volumes. 2. Interstitial coarsening, favor vascular congestion. 3. Prominent gaseous distension of the stomach. Electronically Signed   By: Monte Fantasia M.D.   On: 09/14/2019 10:00   Dg Shoulder Left  Result Date: 09/15/2019 CLINICAL DATA:  Pain and swelling, stroke, unknown injury EXAM: LEFT SHOULDER - 2+ VIEW; LEFT ELBOW - 2 VIEW COMPARISON:  None. FINDINGS: No fracture or dislocation of the left shoulder. The joint spaces are well preserved. The partially imaged  left chest is unremarkable. No fracture or dislocation of the left elbow. Joint spaces are well preserved. No elbow joint effusion. Diffuse soft tissue edema about the elbow. IMPRESSION: 1. No fracture or dislocation of the left shoulder. The joint spaces are well preserved. 2. No fracture or  dislocation of the left elbow. Joint spaces are well preserved. No elbow joint effusion. Diffuse soft tissue edema about the elbow. Electronically Signed   By: Eddie Candle M.D.   On: 09/15/2019 21:50   [place] Procedure Unlisted-no Report  Result Date: 09/28/2019 INDICATION: Renal recovery Request for removal per Dr Marval Regal EXAM: REMOVAL OF TUNNELED HEMODIALYSIS CATHETER MEDICATIONS: 10 cc 1% lidocaine COMPLICATIONS: None immediate. PROCEDURE: Procedure performed at bedside Maximal barrier sterile technique was utilized including caps, mask, sterile gloves, hand hygiene, and Hibiclens 1% lidocaine with epinephrine was injected under sterile conditions along the subcutaneous tunnel. Utilizing gentle traction, the catheter was removed intact. Hemostasis was obtained with manual compression. A dressing was placed. The patient tolerated the procedure well without immediate post procedural complication. IMPRESSION: Successful removal of tunneled dialysis catheter. Read by [facility] Electronically Signed   By: Lavonia Dana M.D.   On: 09/28/2019 07:47    Microbiology: No results found for this or any previous visit (from the past 240 hour(s)).   Labs: Basic Metabolic Panel: Recent Labs  Lab 09/24/19 0659 09/25/19 0624 09/26/19 0651 09/27/19 0518 09/28/19 0441  NA 133* 131* 133*  134* 136 137  K 4.2 3.7 3.8  4.0 4.0 4.0  CL 96* 96* 98  98 99 100  CO2 _0 GLUCOSE 125* 120* 108*  108* 128* 106*  BUN 64* 79* 87*  88* 93* 90*  CREATININE 5.55* 5.66* 5.08*  5.10* 4.23* 3.41*  CALCIUM 8.2* 8.3* 8.6*  8.5* 8.7* 8.9  PHOS  --   --  5.5* 5.0* 4.1   Liver Function Tests: Recent Labs   Lab 09/22/19 0542 09/23/19 0724 09/24/19 0659 09/25/19 0624 09/26/19 0651 09/27/19 0518 09/28/19 0441  AST 53* 42* 40 28 23  --   --   ALT 135* 89* 75* 59* 49*  --   --   ALKPHOS 61 52 57 57 53  --   --   BILITOT 0.8 0.9 0.8 0.6 0.6  --   --   PROT 5.0* 4.9* 5.0* 5.1* 5.1*  --   --   ALBUMIN 2.6* 2.5* 2.6* 2.7* 2.7*  2.7* 2.6* 2.7*   CBC: Recent Labs  Lab 09/23/19 0724 09/24/19 0659 09/25/19 0624 09/26/19 0651 09/27/19 0518  WBC 20.2* 24.9* 31.6* 30.0* 27.5*  HGB 9.1* 8.6* 8.9* 8.6* 8.1*  HCT 26.5* 25.9* 26.5* 25.4* 24.3*  MCV 89.2 90.9 90.4 91.0 91.7  PLT 171 217 258 271 279   Cardiac Enzymes: Recent Labs  Lab 09/23/19 0724 09/24/19 0659 09/25/19 0624 09/26/19 0651 09/27/19 0518  CKTOTAL 1,075* 659* 415* 261* 183   BNP: BNP (last 3 results) Recent Labs    09/14/19 1153  BNP 427.0*   CBG: Recent Labs  Lab 09/27/19 1614 09/27/19 2052 09/28/19 0732 09/28/19 1059 09/28/19 1606  GLUCAP 157* 160* 100* 120* 159*   Signed:  Barton Dubois MD.  [facility] 09/28/2019, 5:11 PM

## 2019-09-28 NOTE — TOC Transition Note (Signed)
Transition of Care [facility]) - CM/SW Discharge Note   Patient Details  Name: Alicia Flowers MRN: [id number] Date of Birth: Dec 09, 1971  Transition of Care [facility]) CM/SW Contact:  Ihor Gully, LCSW Phone Number: 09/28/2019, 2:27 PM   Clinical Narrative:    [facility] contacted and they will contact patient regarding scheduling an appointment. Patient made aware of this. HH (RN,PT) has been arranged through [facility]. DME (rolling walker) ordered through [employer] will be delivered to home.  Patient declined SA resources. LCSW signing off.      Barriers to Discharge: Continued Medical Work up   Patient Goals and CMS Choice        Discharge Placement                       Discharge Plan and Services In-house Referral: Clinical Social Work              DME Arranged: Gilford Rile rolling DME Agency: [employer] Date DME Agency Contacted: 09/28/19 Time DME Agency Contacted: 1100 Representative spoke with at DME Agency: [place]: PT, Social Work [place]: Microbiologist ([place]) Date [place]: 09/28/19 Time [place]: 1100 Representative spoke with at [place]: [place] ([place]) Interventions     Readmission Risk Interventions No flowsheet data found.

## 2019-09-28 NOTE — Progress Notes (Signed)
Patient ID: Alicia Flowers, female   DOB: 09/16/1971, 48 y.o.   MRN: [id number] S: Continues to feel better and plans on moving in with her mother and daughter. O:BP (!) 148/99 (BP Location: Right Arm)   Pulse 88   Temp 98.8 F (37.1 C) (Oral)   Resp 16   Ht 5\' 3"  (1.6 m)   Wt 81 kg   LMP 09/07/2019   SpO2 100%   BMI 31.63 kg/m   Intake/Output Summary (Last 24 hours) at 09/28/2019 0911 Last data filed at 09/28/2019 0600 Gross per 24 hour  Intake 480 ml  Output 2600 ml  Net -2120 ml   Intake/Output: I/O last 3 completed shifts: In: 960 [P.O.:960] Out: 3650 [Urine:3650]  Intake/Output this shift:  No intake/output data recorded. Weight change:  Gen: NAD CVS: no rub Resp: cta Abd: +BS, soft, NT Ext: + edema  Recent Labs  Lab 09/22/19 0542 09/23/19 0724 09/24/19 0659 09/25/19 0624 09/26/19 0651 09/27/19 0518 09/28/19 0441  NA 129* 132* 133* 131* 133*  134* 136 137  K 4.0 3.7 4.2 3.7 3.8  4.0 4.0 4.0  CL 97* 97* 96* 96* 98  98 99 100  CO2 21* 26 27 22 24  25 25 24   GLUCOSE 89 123* 125* 120* 108*  108* 128* 106*  BUN 93* 41* 64* 79* 87*  88* 93* 90*  CREATININE 8.39* 4.54* 5.55* 5.66* 5.08*  5.10* 4.23* 3.41*  ALBUMIN 2.6* 2.5* 2.6* 2.7* 2.7*  2.7* 2.6* 2.7*  CALCIUM 7.8* 7.9* 8.2* 8.3* 8.6*  8.5* 8.7* 8.9  PHOS  --   --   --   --  5.5* 5.0* 4.1  AST 53* 42* 40 28 23  --   --   ALT 135* 89* 75* 59* 49*  --   --    Liver Function Tests: Recent Labs  Lab 09/24/19 0659 09/25/19 0624 09/26/19 0651 09/27/19 0518 09/28/19 0441  AST 40 28 23  --   --   ALT 75* 59* 49*  --   --   ALKPHOS 57 57 53  --   --   BILITOT 0.8 0.6 0.6  --   --   PROT 5.0* 5.1* 5.1*  --   --   ALBUMIN 2.6* 2.7* 2.7*  2.7* 2.6* 2.7*   No results for input(s): LIPASE, AMYLASE in the last 168 hours. No results for input(s): AMMONIA in the last 168 hours. CBC: Recent Labs  Lab 09/23/19 0724 09/24/19 0659 09/25/19 0624 09/26/19 0651 09/27/19 0518  WBC 20.2* 24.9* 31.6*  30.0* 27.5*  HGB 9.1* 8.6* 8.9* 8.6* 8.1*  HCT 26.5* 25.9* 26.5* 25.4* 24.3*  MCV 89.2 90.9 90.4 91.0 91.7  PLT 171 217 258 271 279   Cardiac Enzymes: Recent Labs  Lab 09/23/19 0724 09/24/19 0659 09/25/19 0624 09/26/19 0651 09/27/19 0518  CKTOTAL 1,075* 659* 415* 261* 183   CBG: Recent Labs  Lab 09/27/19 0723 09/27/19 1119 09/27/19 1614 09/27/19 2052 09/28/19 0732  GLUCAP 123* 118* 157* 160* 100*    Iron Studies: No results for input(s): IRON, TIBC, TRANSFERRIN, FERRITIN in the last 72 hours. Studies/Results: [place] Procedure Unlisted-no Report  Result Date: 09/27/2019 There is no Radiologist interpretation  for this exam.  . amLODipine  10 mg Oral Daily  . aspirin EC  81 mg Oral Daily  . atorvastatin  20 mg Oral q1800  . Chlorhexidine Gluconate Cloth  6 each Topical Once   And  . Chlorhexidine Gluconate Cloth  6 each  Topical Once  . Chlorhexidine Gluconate Cloth  6 each Topical Q0600  . feeding supplement (NEPRO CARB STEADY)  237 mL Oral Q24H  . folic acid  1 mg Oral Daily  . heparin injection (subcutaneous)  5,000 Units Subcutaneous Q8H  . insulin aspart  0-5 Units Subcutaneous QHS  . insulin aspart  0-9 Units Subcutaneous TID WC  . levothyroxine  50 mcg Oral Q0600  . multivitamin with minerals  1 tablet Oral Daily  . nicotine  14 mg Transdermal Daily  . pantoprazole  40 mg Oral Daily  . predniSONE  80 mg Oral Q breakfast  . sodium chloride flush  3 mL Intravenous Q12H  . thiamine  100 mg Oral Daily   Or  . thiamine  100 mg Intravenous Daily    BMET    Component Value Date/Time   NA 137 09/28/2019 0441   K 4.0 09/28/2019 0441   CL 100 09/28/2019 0441   CO2 24 09/28/2019 0441   GLUCOSE 106 (H) 09/28/2019 0441   BUN 90 (H) 09/28/2019 0441   CREATININE 3.41 (H) 09/28/2019 0441   CALCIUM 8.9 09/28/2019 0441   GFRNONAA 15 (L) 09/28/2019 0441   GFRAA 18 (L) 09/28/2019 0441   CBC    Component Value Date/Time   WBC 27.5 (H) 09/27/2019 0518   RBC 2.65  (L) 09/27/2019 0518   HGB 8.1 (L) 09/27/2019 0518   HCT 24.3 (L) 09/27/2019 0518   PLT 279 09/27/2019 0518   MCV 91.7 09/27/2019 0518   MCH 30.6 09/27/2019 0518   MCHC 33.3 09/27/2019 0518   RDW 15.9 (H) 09/27/2019 0518   LYMPHSABS 0.6 (L) 09/17/2019 0042   MONOABS 0.4 09/17/2019 0042   EOSABS 0.0 09/17/2019 0042   BASOSABS 0.0 09/17/2019 0042    Assessment/Plan:  1. AKIdue to rhabdomyolysis (related to cocaine and Etoh). Had been on HD for the past week but has started to show increase in UOP. Last HD was 09/22/19 with improvement of Scr from 8.39 to 4.45 but with gradual rise since andpeaked at5.669/28/20 but down to 5.1 without HD. No uremic symptoms and CPK levels down to 415-->216.  1. Continue to hold off on HD as she isshowing signs of continuedrenal recovery. 2. Will consult IR to remove TDC today 3. Stable for discharge from a renal standpoint.   4. Will need f/u with her PCP and also with our office in 2-3 weeks. 2. Rhabdomyolysis- presumably due to cocaine/alcohol and possible loc.  3. Right occipital infarct- per neuro 4. Left arm weakness- per neuro 5. Polysubstance abuse- SW consulted and would benefit from outpatient rehab 6. Abnormal EKC with partial RBBB felt to be due to cocaine 7. HTN - stable 8. Anemia of acute illness- cont to follow and transfuse prn 9. CAP/sepsis- improved with abx 10. Disposition- stable for discharge as above  Donetta Potts, MD [employer] [phone]

## 2019-09-28 NOTE — Progress Notes (Signed)
Nsg Discharge Note  Admit Date:  09/14/2019 Discharge date: 09/28/2019   Alicia Flowers to be D/C'd Home per MD order.  AVS completed.  Copy for chart, and copy for patient signed, and dated. SWOT nurse reviewed d/c paperwork with patient and gave her the preprinted prescriptions. Answered all questions. NT wheeled stable patient and belongings to main entrance where she was picked up by her friend. Patient/caregiver able to verbalize understanding.  Discharge Medication: Allergies as of 09/28/2019   No Known Allergies     Medication List    STOP taking these medications   Aleve 220 MG tablet Generic drug: naproxen sodium   aspirin 325 MG tablet Replaced by: aspirin 81 MG EC tablet   diphenoxylate-atropine 2.5-0.025 MG tablet Commonly known as: Lomotil   naphazoline-glycerin 0.012-0.2 % Soln Commonly known as: CLEAR EYES REDNESS     TAKE these medications   amLODipine 10 MG tablet Commonly known as: NORVASC Take 1 tablet (10 mg total) by mouth daily. Start taking on: September 29, 2019   aspirin 81 MG EC tablet Take 1 tablet (81 mg total) by mouth daily. Start taking on: September 29, 2019 Replaces: aspirin 325 MG tablet   atorvastatin 20 MG tablet Commonly known as: LIPITOR Take 1 tablet (20 mg total) by mouth daily at 6 PM.   folic acid 1 MG tablet Commonly known as: FOLVITE Take 1 tablet (1 mg total) by mouth daily. Start taking on: September 29, 2019   levothyroxine 50 MCG tablet Commonly known as: SYNTHROID Take 1 tablet (50 mcg total) by mouth daily at 6 (six) AM. Start taking on: September 29, 2019   nicotine 14 mg/24hr patch Commonly known as: NICODERM CQ - dosed in mg/24 hours Place 1 patch (14 mg total) onto the skin daily. Start taking on: September 29, 2019   pantoprazole 40 MG tablet Commonly known as: PROTONIX Take 1 tablet (40 mg total) by mouth daily. Start taking on: September 29, 2019   predniSONE 20 MG tablet Commonly known as: DELTASONE Take 4  tablets (80 mg total) by mouth daily with breakfast. Start taking on: September 29, 2019            Durable Medical Equipment  (From admission, onward)         Start     Ordered   09/28/19 1613  For home use only DME Walker rolling  Once    Question:  Patient needs a walker to treat with the following condition  Answer:  Physical deconditioning   09/28/19 1612          Discharge Assessment: Vitals:   09/27/19 2150 09/28/19 0542  BP: (!) 161/93 (!) 148/99  Pulse: 95 88  Resp: 20 16  Temp: 98.5 F (36.9 C) 98.8 F (37.1 C)  SpO2: 100% 100%   Skin clean, dry and intact without evidence of skin break down, no evidence of skin tears noted. IV catheter discontinued intact. Site without signs and symptoms of complications - no redness or edema noted at insertion site, patient denies c/o pain - only slight tenderness at site.  Dressing with slight pressure applied.  D/c Instructions-Education: Discharge instructions given to patient/family with verbalized understanding. D/c education completed with patient/family including follow up instructions, medication list, d/c activities limitations if indicated, with other d/c instructions as indicated by MD - patient able to verbalize understanding, all questions fully answered. Patient instructed to return to ED, call 911, or call MD for any changes in condition.  Patient  escorted via [place], and D/C home via private auto.  Santa Lighter, RN 09/28/2019 6:43 PM

## 2019-09-29 LAB — COCAINE,MS,WB/SP RFX
Benzoylecgonine: 222 ng/mL
Cocaine Confirmation: POSITIVE
Cocaine: NEGATIVE ng/mL

## 2019-10-02 LAB — BENZODIAZEPINES,MS,WB/SP RFX
7-Aminoclonazepam: NEGATIVE ng/mL
Alprazolam: NEGATIVE ng/mL
Benzodiazepines Confirm: POSITIVE
Chlordiazepoxide: NEGATIVE ng/mL
Clonazepam: NEGATIVE ng/mL
Desalkylflurazepam: NEGATIVE ng/mL
Desmethylchlordiazepoxide: NEGATIVE ng/mL
Desmethyldiazepam: NEGATIVE ng/mL
Diazepam: NEGATIVE ng/mL
Flurazepam: NEGATIVE ng/mL
Lorazepam: 27.6 ng/mL
Midazolam: NEGATIVE ng/mL
Oxazepam: NEGATIVE ng/mL
Temazepam: NEGATIVE ng/mL
Triazolam: NEGATIVE ng/mL

## 2019-10-04 LAB — DRUG SCREEN 10 W/CONF, SERUM
Amphetamines, IA: NEGATIVE ng/mL
Barbiturates, IA: NEGATIVE ug/mL
Benzodiazepines, IA: POSITIVE ng/mL — AB
Cocaine & Metabolite, IA: POSITIVE ng/mL — AB
Methadone, IA: NEGATIVE ng/mL
Opiates, IA: NEGATIVE ng/mL
Oxycodones, IA: NEGATIVE ng/mL
Phencyclidine, IA: NEGATIVE ng/mL
Propoxyphene, IA: NEGATIVE ng/mL
THC(Marijuana) Metabolite, IA: POSITIVE ng/mL — AB

## 2019-10-04 LAB — THC,MS,WB/SP RFX
Cannabidiol: NEGATIVE ng/mL
Cannabinoid Confirmation: POSITIVE
Cannabinol: NEGATIVE ng/mL
Carboxy-THC: 207.5 ng/mL
Hydroxy-THC: 5.4 ng/mL
Tetrahydrocannabinol(THC): 8.4 ng/mL

## 2019-10-10 ENCOUNTER — Telehealth: Payer: Self-pay

## 2019-10-10 NOTE — Telephone Encounter (Signed)
Pt. Eligibility is 10/10/2019 till 10/09/2020 with [facility]. Faxed over pt. medassist application and other docs.  Alicia Flowers

## 2019-10-24 ENCOUNTER — Ambulatory Visit: Payer: Self-pay | Admitting: Physician Assistant

## 2019-10-24 ENCOUNTER — Encounter: Payer: Self-pay | Admitting: Physician Assistant

## 2019-10-24 ENCOUNTER — Other Ambulatory Visit (HOSPITAL_COMMUNITY)
Admission: RE | Admit: 2019-10-24 | Discharge: 2019-10-24 | Disposition: A | Discharge status: Home / Self Care | Payer: Medicaid Other | Source: Ambulatory Visit | Attending: Physician Assistant | Admitting: Physician Assistant

## 2019-10-24 ENCOUNTER — Other Ambulatory Visit: Payer: Self-pay

## 2019-10-24 VITALS — BP 146/76 | HR 89 | Temp 98.1°F | Wt 167.6 lb

## 2019-10-24 DIAGNOSIS — E039 Hypothyroidism, unspecified: Secondary | ICD-10-CM | POA: Diagnosis present

## 2019-10-24 DIAGNOSIS — M6282 Rhabdomyolysis: Secondary | ICD-10-CM

## 2019-10-24 DIAGNOSIS — I10 Essential (primary) hypertension: Secondary | ICD-10-CM

## 2019-10-24 DIAGNOSIS — I639 Cerebral infarction, unspecified: Secondary | ICD-10-CM | POA: Diagnosis present

## 2019-10-24 DIAGNOSIS — N179 Acute kidney failure, unspecified: Secondary | ICD-10-CM | POA: Diagnosis present

## 2019-10-24 DIAGNOSIS — R69 Illness, unspecified: Secondary | ICD-10-CM | POA: Diagnosis present

## 2019-10-24 DIAGNOSIS — Z1239 Encounter for other screening for malignant neoplasm of breast: Secondary | ICD-10-CM

## 2019-10-24 DIAGNOSIS — Z7689 Persons encountering health services in other specified circumstances: Secondary | ICD-10-CM

## 2019-10-24 DIAGNOSIS — D649 Anemia, unspecified: Secondary | ICD-10-CM

## 2019-10-24 LAB — CBC
HCT: 34.7 % — ABNORMAL LOW (ref 36.0–46.0)
Hemoglobin: 11.1 g/dL — ABNORMAL LOW (ref 12.0–15.0)
MCH: 31.4 pg (ref 26.0–34.0)
MCHC: 32 g/dL (ref 30.0–36.0)
MCV: 98 fL (ref 80.0–100.0)
Platelets: 319 10*3/uL (ref 150–400)
RBC: 3.54 MIL/uL — ABNORMAL LOW (ref 3.87–5.11)
RDW: 17.4 % — ABNORMAL HIGH (ref 11.5–15.5)
WBC: 18.5 10*3/uL — ABNORMAL HIGH (ref 4.0–10.5)
nRBC: 0.1 % (ref 0.0–0.2)

## 2019-10-24 LAB — TSH: TSH: 0.951 u[IU]/mL (ref 0.350–4.500)

## 2019-10-24 LAB — RENAL FUNCTION PANEL
Albumin: 3.6 g/dL (ref 3.5–5.0)
Anion gap: 11 (ref 5–15)
BUN: 28 mg/dL — ABNORMAL HIGH (ref 6–20)
CO2: 19 mmol/L — ABNORMAL LOW (ref 22–32)
Calcium: 9.2 mg/dL (ref 8.9–10.3)
Chloride: 107 mmol/L (ref 98–111)
Creatinine, Ser: 0.73 mg/dL (ref 0.44–1.00)
GFR calc Af Amer: >60 mL/min (ref >60)
GFR calc non Af Amer: >60 mL/min (ref >60)
Glucose, Bld: 167 mg/dL — ABNORMAL HIGH (ref 70–99)
Phosphorus: 2.4 mg/dL — ABNORMAL LOW (ref 2.5–4.6)
Potassium: 3.5 mmol/L (ref 3.5–5.1)
Sodium: 137 mmol/L (ref 135–145)

## 2019-10-24 LAB — CK: Total CK: 41 U/L (ref 38–234)

## 2019-10-24 MED ORDER — ATENOLOL 25 MG PO TABS: 25.0000 mg | ORAL_TABLET | Freq: Every day | ORAL | 3 refills | Status: DC

## 2019-10-24 NOTE — Progress Notes (Signed)
BP (!) 146/76   Pulse 89   Temp 98.1 F (36.7 C)   Wt 167 lb 9.6 oz (76 kg)   SpO2 99%   BMI 29.69 kg/m    Subjective:    Patient ID: Alicia Flowers, female    DOB: [birth year], 48 y.o.   MRN: [id number]  HPI: Alicia Flowers is a 48 y.o. female presenting on 10/24/2019 for New Patient (Initial Visit)   HPI   Patient had a negative COVID-19 screening questionnaire.    Patient is a 48 year old female who is here today to establish care with PCP.  Patient was discharged from hospital on 09/28/2019 with diagnosis of community-acquired pneumonia, acute renal failure, polysubstance abuse, ischemic stroke.    Pt states she has had no drugs or alcohol since her discharge from the hospital.  She also states that she has stopped smoking since her discharge. She is doing good without counseling for the substance abuse-she says she gets lots of support at home.    Pt hasn't been back to work since she was discharged  from the hospital.  She works at [employer] in [place].   She hasn't seen any specilsts since her discharge from hosptial    Pt continues on large does of prednisone (80mg ) due to rhabdomyolysis-notes from the hospital state that these will need to be tapered by neurology    Pt states her breathing is good.  She says she feels well today.  Pt did not get flu shot  In hosptial    Pt thinks her last pap was maybe in 1994?  Or maybe 2006?    She says that she had a mammogram once at maybe age 52  Patient says she is feeling very well today.  She is very happy with the lifestyle improvements that she has made since her discharge from the hospital.     Relevant past medical, surgical, family and social history reviewed and updated as indicated. Interim medical history since our last visit reviewed. Allergies and medications reviewed and updated.    Current Outpatient Medications:  .  amLODipine (NORVASC) 10 MG tablet, Take 1 tablet (10 mg total) by mouth  daily., Disp: 30 tablet, Rfl: 3 .  aspirin EC 81 MG EC tablet, Take 1 tablet (81 mg total) by mouth daily., Disp: 30 tablet, Rfl: 3 .  atorvastatin (LIPITOR) 20 MG tablet, Take 1 tablet (20 mg total) by mouth daily at 6 PM., Disp: 30 tablet, Rfl: 3 .  docusate sodium (COLACE) 100 MG capsule, Take 100 mg by mouth daily as needed for mild constipation., Disp: , Rfl:  .  folic acid (FOLVITE) 1 MG tablet, Take 1 tablet (1 mg total) by mouth daily., Disp: 30 tablet, Rfl: 2 .  levothyroxine (SYNTHROID) 50 MCG tablet, Take 1 tablet (50 mcg total) by mouth daily at 6 (six) AM., Disp: 30 tablet, Rfl: 2 .  pantoprazole (PROTONIX) 40 MG tablet, Take 1 tablet (40 mg total) by mouth daily., Disp: 30 tablet, Rfl: 2 .  predniSONE (DELTASONE) 20 MG tablet, Take 4 tablets (80 mg total) by mouth daily with breakfast., Disp: 60 tablet, Rfl: 2   Review of Systems  Per HPI unless specifically indicated above     Objective:    BP (!) 146/76   Pulse 89   Temp 98.1 F (36.7 C)   Wt 167 lb 9.6 oz (76 kg)   SpO2 99%   BMI 29.69 kg/m   Wt Readings from Last 3 Encounters:  10/24/19 167 lb 9.6 oz (76 kg)  09/24/19 178 lb 9.2 oz (81 kg)  07/26/19 160 lb (72.6 kg)    Physical Exam Vitals signs reviewed.  Constitutional:      General: She is not in acute distress.    Appearance: Normal appearance. She is well-developed. She is not ill-appearing.  HENT:     Head: Normocephalic and atraumatic.  Eyes:     Conjunctiva/sclera: Conjunctivae normal.     Pupils: Pupils are equal, round, and reactive to light.  Neck:     Musculoskeletal: Neck supple.     Thyroid: No thyromegaly.  Cardiovascular:     Rate and Rhythm: Normal rate and regular rhythm.  Pulmonary:     Effort: Pulmonary effort is normal.     Breath sounds: Normal breath sounds.  Abdominal:     General: Bowel sounds are normal.     Palpations: Abdomen is soft. There is no mass.     Tenderness: There is no abdominal tenderness.  Musculoskeletal:      Right lower leg: No edema.     Left lower leg: No edema.  Lymphadenopathy:     Cervical: No cervical adenopathy.  Skin:    General: Skin is warm and dry.  Neurological:     Mental Status: She is alert and oriented to person, place, and time.     Gait: Gait normal.  Psychiatric:        Attention and Perception: Attention normal.        Mood and Affect: Mood normal.        Speech: Speech normal.        Behavior: Behavior normal. Behavior is cooperative.      Labs done in hospital on 09/14/19: covid - a1c 5.3 tsh 7.473 Hep c - UDS + coc, MJ LFTs  High (AST >2000) K 6.9 Cr 4.7 CK > 50K Cbc elevated wbc o/w unremarkable Lipids panel    10/1:    K+ 4.0, Cr 3.41 9/300-  Ck 183      Assessment & Plan:    Encounter Diagnoses  Name Primary?  . Encounter to establish care Yes  . Non-traumatic rhabdomyolysis   . Acute renal failure, unspecified acute renal failure type ([place])   . Essential hypertension   . Ischemic stroke ([place])   . Hypothyroidism, unspecified type   . Encounter for screening for malignant neoplasm of breast, unspecified screening modality       Patient is congratulated on the many lifestyle improvements she has made in order to help her medical conditions.   Refer to [facility] in [place] where [facility] financial assistance is accepted.  Will defer tapering of prednisone to them as directed from hospital records.   Will consider referral to [facility]-  Consider depending upon renal panel results  Will refer for screening mammogram   Repeat labs today   Gave flu shot voucher for her to get free influenza immunization today  Add atenolol 25mg  to improve blood pressure  Pt to follow up 1 month.  She is to contact office sooner prn

## 2019-11-15 ENCOUNTER — Ambulatory Visit: Payer: Self-pay | Admitting: Family Medicine

## 2019-11-20 ENCOUNTER — Other Ambulatory Visit: Payer: Self-pay | Admitting: Physician Assistant

## 2019-11-28 ENCOUNTER — Ambulatory Visit: Payer: Self-pay | Admitting: Physician Assistant

## 2019-12-07 ENCOUNTER — Encounter: Payer: Self-pay | Admitting: Family Medicine

## 2019-12-07 ENCOUNTER — Other Ambulatory Visit: Payer: Self-pay

## 2019-12-07 ENCOUNTER — Ambulatory Visit (INDEPENDENT_AMBULATORY_CARE_PROVIDER_SITE_OTHER): Payer: Medicaid Other | Admitting: Family Medicine

## 2019-12-07 ENCOUNTER — Telehealth: Payer: Self-pay

## 2019-12-07 VITALS — BP 140/85 | HR 65 | Temp 98.3°F | Ht 63.0 in | Wt 184.2 lb

## 2019-12-07 DIAGNOSIS — I10 Essential (primary) hypertension: Secondary | ICD-10-CM

## 2019-12-07 DIAGNOSIS — R7309 Other abnormal glucose: Secondary | ICD-10-CM

## 2019-12-07 DIAGNOSIS — I639 Cerebral infarction, unspecified: Secondary | ICD-10-CM

## 2019-12-07 DIAGNOSIS — E7849 Other hyperlipidemia: Secondary | ICD-10-CM | POA: Diagnosis not present

## 2019-12-07 DIAGNOSIS — K219 Gastro-esophageal reflux disease without esophagitis: Secondary | ICD-10-CM | POA: Insufficient documentation

## 2019-12-07 DIAGNOSIS — E785 Hyperlipidemia, unspecified: Secondary | ICD-10-CM | POA: Insufficient documentation

## 2019-12-07 DIAGNOSIS — E039 Hypothyroidism, unspecified: Secondary | ICD-10-CM | POA: Diagnosis not present

## 2019-12-07 MED ORDER — PREDNISONE 20 MG PO TABS: ORAL_TABLET | ORAL | 0 refills | Status: DC

## 2019-12-07 NOTE — Progress Notes (Signed)
New Patient Office Visit  Subjective:  Patient ID: Alicia Flowers, female    DOB: 03-Apr-1971  Age: 48 y.o. MRN: [id number]  CC:  Chief Complaint  Patient presents with  . Establish Care  . Medication Management  HTN/Hyperlipidemia/GERD  HPI Alicia Flowers presents for:  hyperlipidemia-atorvastatin 20mg  daily-needs labwork-fsating Hypothyroid-TSH normalized on levothyroxine 27mcg-stable GERD-pantoprazole 40mg  -stable HTN-amlodipine daily, atenolol daily-stable  Hospital Course:  1-hypoxia/community-acquired pneumonia-resolved -Afebrile -WBCs were slowly trending down; anticipated confusion information after initiation of steroids as recommended by neurology service. -Reports no chest pain -Continue flutter valve, good oxygen saturation on room air currently.  -Completed 5-day course of antibiotics with Rocephin  2-acute renal failure-anuric in setting of rhabdomyolysis, now improving -Continue avoiding nephrotoxic agents -Patient has good urine output and nephrology following for now with no plans for hemodialysis currently as she may not need acute hemodialysis chair on discharge -Femoral catheterdiscontinuedon 9/24 -Tunneled catheter placed on 9/24and discontinue on 9/30 -Continue adequate oral hydration -Creatinine at discharge 3.4; patient will follow-up with nephrology as an outpatient and will establish care with PCP to further assure renal function and stability. -Renal ultrasound without obstructive uropathy  3-history of alcohol abuse -no active withdrawal appreciated -Continue thiamine and folic acid.  4-tobacco abuse -Cessation counseling has been provided -Continue nicotine patch  5-right occipital stroke/concerns for myositis  -Patient presenting with left numbness affecting left side -Abnormal electrolytes and renal failure slightly contributed to left side weakness/numbness. -Follow results from dialysis trial -Continue electrolytes  repletion as needed -Followin neurology service recommendations patient has been started on IV steroids. Outpatient and electromyography recommended. -Will continue aspirin for secondary prevention and also statins -LDL of 80 and HDL 31; triglycerides level 241. -A1c 5.3. -Normal B12 level and elevated TSH. -CK level trending down  6-elevated TSH/hypothyroidism -Started on Synthroid and will recommend repeat thyroid profile in 4-6 weeks.  7-mixed hyperlipidemia -discharge on lipitor  8-elevated troponin -No chest pain -No acute ischemic changes appreciated on telemetry or EKG -Reassuring 2D echo with normal ejection fraction and no wall motion abnormalities. -Patient's elevated troponin most likely associated with ongoing rhabdomyolysis and renal failure. -Continue statins and aspirin. -no further acute inpatient cardiac work up.  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 -Alcohol cessation counseling has been provided.  10-rhabdomyolysis likely secondary to cocaine use/alcohol use versus necrotizing myopathy-downtrending -Continue oral prednisone now on 80mg  daily, with anticipated a slow tapering. -Apparently will need tapering over weeks to months with close follow up to Neurology outpatient (follow-up with Dr. Merlene Laughter in 10 days). -She may also end requiring nerve conduction study and electromyography as an outpatient.  11-polysubstance abuse -Repeat urine toxicology positive for cocaine and marijuana -Cessation counseling has been provided.  12-worsening leukocytosis -This does not appear to be infectious and likely related to ongoing steroid use -WBC's trending down now. -afebrile and no source of infection present at this time. -Repeat CBC at follow-up visit to assess WBCs  trend.  13-physical deconditioning -Home health PT has been arranged at discharge -DME rolling walker provided. Consultations:  Nephrology service  Neurology  Cardiology service curbside (Dr. Bronson Ing).  Discharge Diagnoses: 10/20 Principal Problem:   CAP (community acquired pneumonia) Active Problems:   Acute renal failure (ARF) (HCC)   Hypoxia   Alcohol abuse   Cocaine abuse (HCC)   Hyperkalemia   Metabolic acidosis   Hyperphosphatemia  Elevated troponin   Oliguria and anuria   Hypocalcemia   Ischemic stroke [facility])   Past Medical History:  Diagnosis Date  . Anxiety   . Depression   . GERD (gastroesophageal reflux disease)   . Hyperlipidemia   . Hypertension 09/04/2019  . Stroke ([place]) 09/04/2019  . Substance abuse ([place])   . Tennis elbow   . Thyroid disease     Past Surgical History:  Procedure Laterality Date  . APPENDECTOMY    . CENTRAL VENOUS CATHETER INSERTION N/A 09/15/2019   Procedure: MINOR INSERTION CENTRAL LINE ADULT;  Surgeon: Aviva Signs, MD;  Location: [facility];  Service: General;  Laterality: N/A;  . CESAREAN SECTION    . IR FLUORO GUIDE CV LINE RIGHT  09/21/2019  . IR US GUIDE VASC ACCESS RIGHT  09/21/2019  . TUBAL LIGATION      Family History  Problem Relation Age of Onset  . Hypertension Mother   . Cancer Mother        breast cancer  . Healthy Mother   . Hypertension Father   . Hypertension Maternal Grandmother   . Hypertension Maternal Grandfather   . Healthy Sister   . Healthy Brother   . Hyperlipidemia Brother   . Hypertension Brother     Social History   Socioeconomic History  . Marital status: Single    Spouse name: Not on file  . Number of children: Not on file  . Years of education: Not on file  . Highest education level: Not on file  Occupational History  . Occupation: [employer]  Tobacco Use  . Smoking status: Current Every Day Smoker    Packs/day: 0.50    Years: 27.00    Pack years: 13.50    Types:  Cigarettes  . Smokeless tobacco: Never Used  Substance and Sexual Activity  . Alcohol use: Yes    Comment: whiskey daily  . Drug use: Yes    Types: Marijuana    Comment: daily  . Sexual activity: Yes    Birth control/protection: Surgical  Other Topics Concern  . Not on file  Social History Narrative  . Not on file   Social Determinants of Health   Financial Resource Strain:   . Difficulty of Paying Living Expenses: Not on file  Food Insecurity:   . Worried About Charity fundraiser in the Last Year: Not on file  . Ran Out of Food in the Last Year: Not on file  Transportation Needs:   . Lack of Transportation (Medical): Not on file  . Lack of Transportation (Non-Medical): Not on file  Physical Activity:   . Days of Exercise per Week: Not on file  . Minutes of Exercise per Session: Not on file  Stress:   . Feeling of Stress : Not on file  Social Connections:   . Frequency of Communication with Friends and Family: Not on file  . Frequency of Social Gatherings with Friends and Family: Not on file  . Attends Religious Services: Not on file  . Active Member of Clubs or Organizations: Not on file  . Attends Archivist Meetings: Not on file  . Marital Status: Not on file  Intimate Partner Violence:   . Fear of Current or Ex-Partner: Not on file  . Emotionally Abused: Not on file  . Physically Abused: Not on file  . Sexually Abused: Not on file    ROS Review of Systems  Constitutional: Positive for appetite change and fatigue.  HENT: Positive for  facial swelling and voice change.   Cardiovascular: Positive for leg swelling.  Gastrointestinal: Positive for constipation.  Endocrine: Positive for polydipsia.  Musculoskeletal: Positive for neck stiffness.  Neurological: Positive for tremors, weakness and numbness.    Objective:   Today's Vitals: BP 140/85 (BP Location: Left Arm, Patient Position: Sitting, Cuff Size: Normal)   Pulse 65   Temp 98.3 F (36.8 C)  (Oral)   Ht 5\' 3"  (1.6 m)   Wt 184 lb 3.2 oz (83.6 kg)   SpO2 99%   BMI 32.63 kg/m   Physical Exam Constitutional:      Appearance: Normal appearance.  Cardiovascular:     Rate and Rhythm: Normal rate and regular rhythm.     Pulses: Normal pulses.     Heart sounds: Normal heart sounds.  Pulmonary:     Effort: Pulmonary effort is normal.     Breath sounds: Normal breath sounds.  Musculoskeletal:     Cervical back: Normal range of motion and neck supple.  Neurological:     Mental Status: She is alert and oriented to person, place, and time.  Psychiatric:        Mood and Affect: Mood normal.        Behavior: Behavior normal.     Assessment & Plan:    Outpatient Encounter Medications as of 12/07/2019  Medication Sig  . amLODipine (NORVASC) 10 MG tablet Take 1 tablet (10 mg total) by mouth daily.  Marland Kitchen aspirin EC 81 MG EC tablet Take 1 tablet (81 mg total) by mouth daily.  Marland Kitchen atenolol (TENORMIN) 25 MG tablet Take 1 tablet (25 mg total) by mouth daily.  Marland Kitchen atorvastatin (LIPITOR) 20 MG tablet Take 1 tablet (20 mg total) by mouth daily at 6 PM.  . docusate sodium (COLACE) 100 MG capsule Take 100 mg by mouth daily as needed for mild constipation.  . folic acid (FOLVITE) 1 MG tablet Take 1 tablet (1 mg total) by mouth daily.  Marland Kitchen levothyroxine (SYNTHROID) 50 MCG tablet Take 1 tablet (50 mcg total) by mouth daily at 6 (six) AM.  . pantoprazole (PROTONIX) 40 MG tablet Take 1 tablet (40 mg total) by mouth daily.  . predniSONE (DELTASONE) 20 MG tablet TAKE 4 TABLETS BY MOUTH EVERY DAY WITH BREAKFAST   No facility-administered encounter medications on file as of 12/07/2019.   1. Ischemic stroke ([place]) Needs follow up post hospitalization - Ambulatory referral to Neurology  2. Other hyperlipidemia ATORVASTATIN-lipid panel 3. Essential hypertension Pt to take bp at home  4. Hypothyroidism, unspecified type Levothyroxine-10/20-stable  5. Gastroesophageal reflux disease without  esophagitis Pantoprazole-stable  6. Cerebrovascular accident (CVA), unspecified mechanism ([place]) Needs assessment Follow-up:1 month-recheck blood pressure reading-at home LISA Hannah Beat, MD

## 2019-12-07 NOTE — Telephone Encounter (Signed)
LeighAnn Etsuko Dierolf, CMA  

## 2019-12-07 NOTE — Patient Instructions (Addendum)
Fasting labwork-[facility] Neuro follow up Take blood pressure at home-first thing in the morning

## 2019-12-09 LAB — COMPLETE METABOLIC PANEL WITH GFR
AG Ratio: 1.5 (calc) (ref 1.0–2.5)
ALT: 15 U/L (ref 6–29)
AST: 11 U/L (ref 10–35)
Albumin: 3.7 g/dL (ref 3.6–5.1)
Alkaline phosphatase (APISO): 46 U/L (ref 31–125)
BUN/Creatinine Ratio: 29 (calc) — ABNORMAL HIGH (ref 6–22)
BUN: 29 mg/dL — ABNORMAL HIGH (ref 7–25)
CO2: 25 mmol/L (ref 20–32)
Calcium: 9.6 mg/dL (ref 8.6–10.2)
Chloride: 104 mmol/L (ref 98–110)
Creat: 0.99 mg/dL (ref 0.50–1.10)
GFR, Est African American: 79 mL/min/{1.73_m2} (ref >=60)
GFR, Est Non African American: 68 mL/min/{1.73_m2} (ref >=60)
Globulin: 2.5 g/dL (calc) (ref 1.9–3.7)
Glucose, Bld: 88 mg/dL (ref 65–139)
Potassium: 4.6 mmol/L (ref 3.5–5.3)
Sodium: 139 mmol/L (ref 135–146)
Total Bilirubin: 0.3 mg/dL (ref 0.2–1.2)
Total Protein: 6.2 g/dL (ref 6.1–8.1)

## 2019-12-09 LAB — HEMOGLOBIN A1C
Hgb A1c MFr Bld: 6.2 % of total Hgb — ABNORMAL HIGH (ref <5.7)
Mean Plasma Glucose: 131 (calc)
eAG (mmol/L): 7.3 (calc)

## 2019-12-12 ENCOUNTER — Other Ambulatory Visit: Payer: Self-pay

## 2019-12-12 ENCOUNTER — Ambulatory Visit: Payer: Medicaid Other | Admitting: Neurology

## 2019-12-12 ENCOUNTER — Encounter: Payer: Self-pay | Admitting: Neurology

## 2019-12-12 VITALS — BP 137/85 | HR 75 | Temp 97.3°F | Ht 63.0 in | Wt 190.0 lb

## 2019-12-12 DIAGNOSIS — I639 Cerebral infarction, unspecified: Secondary | ICD-10-CM

## 2019-12-12 NOTE — Progress Notes (Signed)
PATIENT: Alicia Flowers DOB: 07-03-1971  Chief Complaint  Patient presents with  . Cerebrovascular Accident    She is here to establish neruology care following her CVA in 09/14/2019.  She is now taking aspirin 81mg , Norvasc 10mg  and Lipitor 20mg .  She will sometimes have tingling in her bilateral arms but wonders if the symptoms are related to Lipitor.  She has some residual left-sided weakness but feels this is due to lack of continuing her home PT/OT exercises.  She used cocaine the day of the CVA but says this is not routine for her. She feels ready to return to work, as a Clinical research associate, at [employer].    Marland Kitchen PCP    Corum, Rex Kras, MD     HISTORICAL  Alicia Flowers is a 48 year old female, seen in request by her primary care physician Dr. Benny Lennert for evaluation of stroke, initial evaluation was on December 12, 2019.  I have reviewed and summarized the referring note from the referring physician.  She had a history of hypertension, hyperlipidemia, hypothyroidism, on supplement, longtime alcohol use, tobacco use 1 pack a day, marijuana use, and cocaine abuse, presented to emergency room on September 14, 2019 after a party where she drinks excessively, also used cocaine, when she was awake, she noticed left arm and leg numbness, weakness, mild gait abnormality,  I personally reviewed MRI of the brain without contrast, acute infarction affecting right occipital cortex, otherwise no acute abnormality  CT cervical spine showed mild degenerative changes, degraded by motion  Echocardiogram showed ejection fraction 60 to 65%  Ultrasound of carotid artery showed no hemodynamic significant stenosis  Laboratory evaluations in December 2020, creatinine 0.99, A1c of 6.2, CPK of 41, normal TSH, LDL was 80  UDS was positive for cocaine Upon presentation in September 2020, CPK was markedly elevated 24 838,   Since hospital discharge, she has regained significant recovery, no complaints of mild right  shoulder limitation, 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  ALLERGIES: No Known Allergies  HOME MEDICATIONS: Current Outpatient Medications  Medication Sig Dispense Refill  . amLODipine (NORVASC) 10 MG tablet Take 1 tablet (10 mg total) by mouth daily. 30 tablet 3  . aspirin EC 81 MG EC tablet Take 1 tablet (81 mg total) by mouth daily. 30 tablet 3  . atenolol (TENORMIN) 25 MG tablet Take 1 tablet (25 mg total) by mouth daily. 30 tablet 3  . atorvastatin (LIPITOR) 20 MG tablet Take 1 tablet (20 mg total) by mouth daily at 6 PM. 30 tablet 3  . docusate sodium (COLACE) 100 MG capsule Take 100 mg by mouth daily as needed for mild constipation.    . folic acid (FOLVITE) 1 MG tablet Take 1 tablet (1 mg total) by mouth daily. 30 tablet 2  . levothyroxine (SYNTHROID) 50 MCG tablet Take 1 tablet (50 mcg total) by mouth daily at 6 (six) AM. 30 tablet 2  . pantoprazole (PROTONIX) 40 MG tablet Take 1 tablet (40 mg total) by mouth daily. 30 tablet 2  . predniSONE (DELTASONE) 20 MG tablet Take one po TID with food this week then one po BID next week 35 tablet 0   No current facility-administered medications for this visit.    PAST MEDICAL  HISTORY: Past Medical History:  Diagnosis Date  . Anxiety   . Depression   . GERD (gastroesophageal reflux disease)   . Hyperlipidemia   . Hypertension 09/04/2019  . Stroke ([place]) 09/04/2019  . Substance abuse ([place])   . Tennis elbow   . Thyroid disease     PAST SURGICAL HISTORY: Past Surgical History:  Procedure Laterality Date  . APPENDECTOMY    . CENTRAL VENOUS CATHETER INSERTION N/A 09/15/2019   Procedure: MINOR INSERTION CENTRAL LINE ADULT;  Surgeon: Aviva Signs, MD;  Location: [facility];  Service: General;  Laterality: N/A;  . CESAREAN  SECTION    . IR FLUORO GUIDE CV LINE RIGHT  09/21/2019  . IR US GUIDE VASC ACCESS RIGHT  09/21/2019  . TUBAL LIGATION      FAMILY HISTORY: Family History  Problem Relation Age of Onset  . Hypertension Mother   . Cancer Mother        breast cancer  . Healthy Mother   . Hypertension Father   . Hypertension Maternal Grandmother   . Hypertension Maternal Grandfather   . Healthy Sister   . Healthy Brother   . Hyperlipidemia Brother   . Hypertension Brother     SOCIAL HISTORY: Social History   Socioeconomic History  . Marital status: Single    Spouse name: Not on file  . Number of children: 2  . Years of education: two years college  . Highest education level: Not on file  Occupational History  . Occupation: Paediatric nurse - stocker  Tobacco Use  . Smoking status: Former Smoker    Packs/day: 0.50    Years: 27.00    Pack years: 13.50    Types: Cigarettes    Quit date: 08/2019    Years since quitting: 0.2  . Smokeless tobacco: Never Used  Substance and Sexual Activity  . Alcohol use: Yes    Comment: She was drinking whiskey daily but not since CVA.  . Drug use: Yes    Types: Marijuana, Cocaine    Comment: She has not used marijuana since CVA.  She used cocaine the day of her CVA.  Marland Kitchen Sexual activity: Yes    Birth control/protection: Surgical  Other Topics Concern  . Not on file  Social History Narrative   Lives at home with parents.   Right-handed.   1 cup caffeine per day.   Social Determinants of Health   Financial Resource Strain:   . Difficulty of Paying Living Expenses: Not on file  Food Insecurity:   . Worried About Charity fundraiser in the Last Year: Not on file  . Ran Out of Food in the Last Year: Not on file  Transportation Needs:   . Lack of Transportation (Medical): Not on file  . Lack of Transportation (Non-Medical): Not on file  Physical Activity:   . Days of Exercise per Week: Not on file  . Minutes of Exercise per Session: Not on file  Stress:   .  Feeling of Stress : Not on file  Social Connections:   . Frequency of Communication with Friends and Family: Not on file  . Frequency of Social Gatherings with Friends and Family: Not on file  . Attends Religious Services: Not on file  . Active Member of Clubs or Organizations: Not on file  . Attends Archivist Meetings: Not on file  . Marital Status: Not on file  Intimate Partner Violence:   . Fear of Current or Ex-Partner: Not on file  .  Emotionally Abused: Not on file  . Physically Abused: Not on file  . Sexually Abused: Not on file     PHYSICAL EXAM   Vitals:   12/12/19 1101  BP: 137/85  Pulse: 75  Temp: (!) 97.3 F (36.3 C)  Weight: 190 lb (86.2 kg)  Height: 5\' 3"  (1.6 m)    Not recorded      Body mass index is 33.66 kg/m.  PHYSICAL EXAMNIATION:  Gen: NAD, conversant, well nourised, well groomed                     Cardiovascular: Regular rate rhythm, no peripheral edema, warm, nontender. Eyes: Conjunctivae clear without exudates or hemorrhage Neck: Supple, no carotid bruits. Pulmonary: Clear to auscultation bilaterally   NEUROLOGICAL EXAM:  MENTAL STATUS: Speech:    Speech is normal; fluent and spontaneous with normal comprehension.  Cognition:     Orientation to time, place and person     Normal recent and remote memory     Normal Attention span and concentration     Normal Language, naming, repeating,spontaneous speech     Fund of knowledge   CRANIAL NERVES: CN II: Visual fields are full to confrontation. Pupils are round equal and briskly reactive to light. CN III, IV, VI: extraocular movement are normal. No ptosis. CN V: Facial sensation is intact to light touch CN VII: Face is symmetric with normal eye closure  CN VIII: Hearing is normal to causal conversation. CN IX, X: Phonation is normal. CN XI: Head turning and shoulder shrug are intact  MOTOR: She has limited range of motion of her right shoulder, muscle bulk and tone are  normal. Muscle strength is normal.  REFLEXES: Reflexes are 2+ and symmetric at the biceps, triceps, knees, and ankles. Plantar responses are flexor.  SENSORY: Intact to light touch, pinprick and vibratory sensation are intact in fingers and toes.  COORDINATION: There is no trunk or limb dysmetria noted.  GAIT/STANCE: Posture is normal. Gait is steady with normal steps, base, arm swing, and turning. Heel and toe walking are normal. Tandem gait is normal.  Romberg is absent.   DIAGNOSTIC DATA (LABS, IMAGING, TESTING) - I reviewed patient records, labs, notes, testing and imaging myself where available.   ASSESSMENT AND PLAN  Alicia Flowers is a 48 y.o. female    Acute stroke in September 2020  That is  related to alcohol, cocaine abuse  She has significant recovery, major limitation is from her right shoulder limited range of motion, pain, likely suggestive of right shoulder pathology  She is able to go back to work with some limitations, paperwork was completed,  Continue aspirin 81 mg daily   Marcial Pacas, M.D. Ph.D.  [facility] [address], [place], [place] [address] Ph: [phone] Fax: [phone]  CC: Referring Provider

## 2019-12-25 ENCOUNTER — Telehealth: Payer: Self-pay

## 2019-12-25 DIAGNOSIS — E039 Hypothyroidism, unspecified: Secondary | ICD-10-CM

## 2019-12-25 MED ORDER — LEVOTHYROXINE SODIUM 50 MCG PO TABS: 50.0000 ug | ORAL_TABLET | Freq: Every day | ORAL | 2 refills | Status: DC

## 2019-12-25 NOTE — Telephone Encounter (Signed)
Alicia Flowers, CMA  

## 2019-12-26 ENCOUNTER — Telehealth: Payer: Self-pay

## 2019-12-26 DIAGNOSIS — E875 Hyperkalemia: Secondary | ICD-10-CM

## 2019-12-26 DIAGNOSIS — K219 Gastro-esophageal reflux disease without esophagitis: Secondary | ICD-10-CM

## 2019-12-26 MED ORDER — PANTOPRAZOLE SODIUM 40 MG PO TBEC: 40.0000 mg | DELAYED_RELEASE_TABLET | Freq: Every day | ORAL | 2 refills | Status: DC

## 2019-12-26 MED ORDER — FOLIC ACID 1 MG PO TABS: 1.0000 mg | ORAL_TABLET | Freq: Every day | ORAL | 2 refills | Status: DC

## 2019-12-26 NOTE — Telephone Encounter (Signed)
Alicia Flowers, CMA  

## 2019-12-29 ENCOUNTER — Other Ambulatory Visit: Payer: Self-pay | Admitting: Family Medicine

## 2019-12-29 DIAGNOSIS — I639 Cerebral infarction, unspecified: Secondary | ICD-10-CM

## 2019-12-31 NOTE — Telephone Encounter (Signed)
Requested medication (s) are due for refill today: no  Requested medication (s) are on the active medication list: yes  Last refill:  12/07/2019  Future visit scheduled: yes  Notes to clinic:  review for refill   Requested Prescriptions  Pending Prescriptions Disp Refills   predniSONE (DELTASONE) 20 MG tablet [Pharmacy Med Name: PREDNISONE 20MG  TABLETS] 35 tablet 0    Sig: TAKE 1 TABLET BY MOUTH THREE TIMES DAILY WITH FOOD THIS WEEK THEN 1 TABLET BY MOUTH TWICE DAILY NEXT WEEK      There is no refill protocol information for this order

## 2020-01-01 ENCOUNTER — Telehealth: Payer: Self-pay | Admitting: Family Medicine

## 2020-01-01 ENCOUNTER — Telehealth: Payer: Self-pay

## 2020-01-01 DIAGNOSIS — I639 Cerebral infarction, unspecified: Secondary | ICD-10-CM

## 2020-01-01 NOTE — Telephone Encounter (Signed)
Patient is calling to see why this medication was denied.

## 2020-01-01 NOTE — Telephone Encounter (Signed)
Patient is supposed to be weaned off at this point per Dr. Holly Bodily

## 2020-01-01 NOTE — Telephone Encounter (Signed)
Requested medication (s) are due for refill today: no  Requested medication (s) are on the active medication list: yes  Last refill:  12/07/2019  Future visit scheduled: yes  Notes to clinic:  review for refill   Requested Prescriptions  Pending Prescriptions Disp Refills   predniSONE (DELTASONE) 20 MG tablet [Pharmacy Med Name: PREDNISONE 20MG  TABLETS] 35 tablet 0    Sig: TAKE 1 TABLET BY MOUTH THREE TIMES DAILY WITH FOOD THIS WEEK THEN 1 TABLET BY MOUTH TWICE DAILY NEXT WEEK      There is no refill protocol information for this order

## 2020-01-01 NOTE — Telephone Encounter (Signed)
Alicia Flowers states that she was taking Prednisone 1 in the morning and 1 at night and she has been out of a week now and I explained to her that Dr. Holly Bodily no longer wanted her taking Prednisone at this time

## 2020-01-08 ENCOUNTER — Other Ambulatory Visit: Payer: Self-pay

## 2020-01-08 ENCOUNTER — Telehealth (INDEPENDENT_AMBULATORY_CARE_PROVIDER_SITE_OTHER): Payer: Medicaid Other | Admitting: Family Medicine

## 2020-01-08 ENCOUNTER — Encounter: Payer: Self-pay | Admitting: Family Medicine

## 2020-01-08 ENCOUNTER — Telehealth: Payer: Self-pay

## 2020-01-08 VITALS — BP 140/85 | Ht 63.0 in | Wt 184.0 lb

## 2020-01-08 DIAGNOSIS — R7309 Other abnormal glucose: Secondary | ICD-10-CM | POA: Diagnosis not present

## 2020-01-08 DIAGNOSIS — I10 Essential (primary) hypertension: Secondary | ICD-10-CM

## 2020-01-08 DIAGNOSIS — I639 Cerebral infarction, unspecified: Secondary | ICD-10-CM

## 2020-01-08 DIAGNOSIS — K219 Gastro-esophageal reflux disease without esophagitis: Secondary | ICD-10-CM

## 2020-01-08 DIAGNOSIS — H538 Other visual disturbances: Secondary | ICD-10-CM

## 2020-01-08 DIAGNOSIS — K5909 Other constipation: Secondary | ICD-10-CM

## 2020-01-08 DIAGNOSIS — E039 Hypothyroidism, unspecified: Secondary | ICD-10-CM

## 2020-01-08 NOTE — Telephone Encounter (Signed)
I spoke to the patient and scheduled her on 02/01/2020.  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

## 2020-01-08 NOTE — Patient Instructions (Signed)
Call neurology for appt asap as blurred vision with dizziness Need assessment for RTW  Will make appointment with eye specialist asap to assess changes-concern for bp vs vascular Ischemic infarct  9/20  Labwork fasting -will send lab order to pt to complete  Obtain blood pressure cuff to check blood pressure in the morning and as needed with any symptoms such as dizziness, visual changes, headaches, chest pain or other concerns  Go to ER for symptoms if worsening headaches, chest pain, dizziness with visual changes, loss of strength or function or other concerns

## 2020-01-08 NOTE — Progress Notes (Signed)
Virtual Visit via Telephone Note  I connected with Alicia Flowers on 01/08/20 at  8:20 AM EST by telephone and verified that I am speaking with the correct person using two identifiers.  Location: Patient:home Provider: clinic   I discussed the limitations, risks, security and privacy concerns of performing an evaluation and management service by telephone and the availability of in person appointments. I also discussed with the patient that there may be a patient responsible charge related to this service. The patient expressed understanding and agreed to proceed.   History of Present Illness: Admission for ischemic stroke-pt has not returned to work due to limitation imposed by neurology. Pt's employer rejected limitations on pts work restrictions given by neurology. Pt states she is to RTW in Feb. Pt has completed changed where she is living and is no longer taking any drugs, smoking or drinking alcohol. Pt states she has a good support system and is ready to return to work.  Pt is no longer taking prednisone. Weaned off  Medication.  Pt with no GERD symptoms-continues to take protonix. No abdominal pain. Pt states constipation intermittently-no longer using colace.  Pt is taking lipitor daily, synthroid daily, atenolol daily, amlodipine daily, folic acid daily with no concerns.  Pt states she has dizziness and double vision episodically 4 days /week that is transient.  No blurred vision. NO headaches. No chest pain.  Pt is not able to take blood pressure as she can not obtain a cuff.  Pt states these symptoms have no escalated.  No loss of function-walking, talking, swallowing, movement arms/legs.  Pt feels LROM shoulders bilat.  Pt is no longer using colace. Pt is no longer taking prednisone   Observations/Objective: No vital signs  Assessment and Plan: 1. Cerebrovascular accident (CVA), unspecified mechanism ([place]) Pt with blurred vision transient with no associated headaches, nausea,  vomiting, lack of function-pt unable to take blood pressure No longer taking prednisone Pt is taking amlodipine and atenolol Pt no longer drinking alcohol, taking drugs or smoking Pt will make neurology appointment for follow up-pt needs release from work 2. Gastroesophageal reflux disease without esophagitis protonix  3. Elevated glucose Will recheck at follow up appointment  4. Essential hypertension Atenolol/amlodipine-d/w pt need to check bp to see if associated visual changes and eye changes related-d/w pt concern for vascular association Pt acknowledges understanding of concern  5. Hypothyroidism, unspecified type synthyroid-TSH normal   6. Blurred vision, bilateral Eye specialist appt-urgent since h/o neurologic event in the past and now with double vision. Pt with dizziness associated. No chest pain.  1cm acute infarct on MRI brain 9/20 7. Constipation-miralax-watch stool color-previously taking colace while hospitalized-increase water, fiber, miralax  Renal function normalized on follow up labwork-will recheck TSH normal 10/20-will recheck with changes in medication and health status 12./20 A1c 6.2%-will recheck in 4 months Elevated WBC-pt on steroids at time of check-recheck off steroids Pt to call neuro for appointment-dizziness with visual changes, pt need assessment as well as assessment for RTW capabilities per employer.  Follow Up Instructions: Stat referral to eye specialist for evaluation-new onset double vision with dizziness with h/o of CVA in 9/20-pt notes as ongoing 4 times/week. No prior h/o visual changes.    I discussed the assessment and treatment plan with the patient. The patient was provided an opportunity to ask questions and all were answered. The patient agreed with the plan and demonstrated an understanding of the instructions.   The patient was advised to call back or seek  an in-person evaluation if the symptoms worsen or if the condition fails to  improve as anticipated.  I provided 22 minutes of non-face-to-face time during this encounter.   Hicks Feick Hannah Beat, MD

## 2020-01-08 NOTE — Telephone Encounter (Signed)
Patient called wanting to schedule an appointment for 100% medical clearance before feb 16th. No sooner available apt at the time of call. Please follow up

## 2020-02-01 ENCOUNTER — Other Ambulatory Visit: Payer: Self-pay

## 2020-02-01 ENCOUNTER — Encounter: Payer: Self-pay | Admitting: Neurology

## 2020-02-01 ENCOUNTER — Ambulatory Visit: Payer: Medicaid Other | Admitting: Neurology

## 2020-02-01 VITALS — BP 139/91 | HR 96 | Temp 97.8°F | Ht 64.0 in | Wt 199.5 lb

## 2020-02-01 DIAGNOSIS — I639 Cerebral infarction, unspecified: Secondary | ICD-10-CM

## 2020-02-01 DIAGNOSIS — M25511 Pain in right shoulder: Secondary | ICD-10-CM | POA: Diagnosis not present

## 2020-02-01 DIAGNOSIS — G8929 Other chronic pain: Secondary | ICD-10-CM | POA: Diagnosis not present

## 2020-02-01 NOTE — Progress Notes (Signed)
PATIENT: Alicia Flowers DOB: [birth year]  Chief Complaint  Patient presents with  . Hx of CVA    Reports she is doing well. 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     HISTORICAL  Alicia Flowers is a 49 year old female, seen in request by her primary care physician Dr. Benny Lennert for evaluation of stroke, initial evaluation was on December 12, 2019.  I have reviewed and summarized the referring note from the referring physician.  She had a history of hypertension, hyperlipidemia, hypothyroidism, on supplement, longtime alcohol use, tobacco use 1 pack a day, marijuana use, and cocaine abuse, presented to emergency room on September 14, 2019 after a party where she drinks excessively, also used cocaine, when she was awake, she noticed left arm and leg numbness, weakness, mild gait abnormality,  I personally reviewed MRI of the brain without contrast, acute infarction affecting right occipital cortex, otherwise no acute abnormality  CT cervical spine showed mild degenerative changes, degraded by motion  Echocardiogram showed ejection fraction 60 to 65%  Ultrasound of carotid artery showed no hemodynamic significant stenosis  Laboratory evaluations in December 2020, creatinine 0.99, A1c of 6.2, CPK of 41, normal TSH, LDL was 80  UDS was positive for cocaine Upon presentation in September 2020, CPK was markedly elevated 24 838,   Since hospital discharge, she has regained significant recovery, no complaints of mild right shoulder limitation, 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  Laboratory evaluation in December 2020, normal CMP, TSH, A1c 6.2, CPK 41  REVIEW OF SYSTEMS: Full 14 system review of systems performed and notable only for as above All other review of systems were negative.  ALLERGIES: No Known Allergies  HOME MEDICATIONS: Current Outpatient Medications  Medication Sig Dispense Refill  . amLODipine (NORVASC) 10 MG tablet Take 1 tablet (10 mg total) by mouth daily. 30 tablet 3  . aspirin EC 81 MG EC tablet Take 1 tablet (81 mg total) by mouth daily. 30 tablet 3  . atenolol (TENORMIN) 25 MG tablet Take 1 tablet (25 mg total) by mouth daily. 30 tablet 3  . atorvastatin (LIPITOR) 20 MG tablet Take 1 tablet (20 mg total) by mouth daily at 6 PM. 30 tablet 3  . docusate sodium (COLACE) 100 MG capsule Take 100 mg by mouth daily as needed for mild constipation.    . folic acid (FOLVITE) 1 MG tablet Take 1 tablet (1 mg total) by mouth daily. 30 tablet 2  . levothyroxine (SYNTHROID) 50 MCG tablet Take 1 tablet (50 mcg total) by mouth daily at 6 (six) AM. 30 tablet 2  . pantoprazole (PROTONIX) 40 MG tablet Take 1 tablet (40 mg total) by mouth daily. 30 tablet 2   No current facility-administered medications for this visit.    PAST MEDICAL HISTORY: Past Medical History:  Diagnosis Date  . Anxiety   . Depression   . GERD (gastroesophageal reflux disease)   . Hyperlipidemia   . Hypertension  09/04/2019  . Stroke ([place]) 09/04/2019  . Substance abuse ([place])   . Tennis elbow   . Thyroid disease     PAST SURGICAL HISTORY: Past Surgical History:  Procedure Laterality Date  . APPENDECTOMY    . CENTRAL VENOUS CATHETER INSERTION N/A 09/15/2019   Procedure: MINOR INSERTION CENTRAL LINE ADULT;  Surgeon: Aviva Signs, MD;  Location: [facility];  Service: General;  Laterality: N/A;  . CESAREAN SECTION    . IR FLUORO GUIDE CV LINE RIGHT  09/21/2019  . IR US GUIDE VASC ACCESS RIGHT   09/21/2019  . TUBAL LIGATION      FAMILY HISTORY: Family History  Problem Relation Age of Onset  . Hypertension Mother   . Cancer Mother        breast cancer  . Healthy Mother   . Hypertension Father   . Hypertension Maternal Grandmother   . Hypertension Maternal Grandfather   . Healthy Sister   . Healthy Brother   . Hyperlipidemia Brother   . Hypertension Brother     SOCIAL HISTORY: Social History   Socioeconomic History  . Marital status: Single    Spouse name: Not on file  . Number of children: 2  . Years of education: two years college  . Highest education level: Not on file  Occupational History  . Occupation: Paediatric nurse - stocker  Tobacco Use  . Smoking status: Former Smoker    Packs/day: 0.50    Years: 27.00    Pack years: 13.50    Types: Cigarettes    Quit date: 08/2019    Years since quitting: 0.4  . Smokeless tobacco: Never Used  Substance and Sexual Activity  . Alcohol use: Yes    Comment: She was drinking whiskey daily but not since CVA.  . Drug use: Yes    Types: Marijuana, Cocaine    Comment: She has not used marijuana since CVA.  She used cocaine the day of her CVA.  Marland Kitchen Sexual activity: Yes    Birth control/protection: Surgical  Other Topics Concern  . Not on file  Social History Narrative   Lives at home with parents.   Right-handed.   1 cup caffeine per day.   Social Determinants of Health   Financial Resource Strain:   . Difficulty of Paying Living Expenses: Not on file  Food Insecurity:   . Worried About Charity fundraiser in the Last Year: Not on file  . Ran Out of Food in the Last Year: Not on file  Transportation Needs:   . Lack of Transportation (Medical): Not on file  . Lack of Transportation (Non-Medical): Not on file  Physical Activity:   . Days of Exercise per Week: Not on file  . Minutes of Exercise per Session: Not on file  Stress:   . Feeling of Stress : Not on file  Social Connections:   . Frequency of Communication  with Friends and Family: Not on file  . Frequency of Social Gatherings with Friends and Family: Not on file  . Attends Religious Services: Not on file  . Active Member of Clubs or Organizations: Not on file  . Attends Archivist Meetings: Not on file  . Marital Status: Not on file  Intimate Partner Violence:   . Fear of Current or Ex-Partner: Not on file  . Emotionally Abused: Not on file  . Physically Abused: Not on file  . Sexually Abused: Not on file     PHYSICAL EXAM   Vitals:  02/01/20 0845  BP: (!) 139/91  Pulse: 96  Temp: 97.8 F (36.6 C)  Weight: 199 lb 8 oz (90.5 kg)  Height: 5\' 4"  (1.626 m)    Not recorded      Body mass index is 34.24 kg/m.  PHYSICAL EXAMNIATION:  Gen: NAD, conversant, well nourised, well groomed                     Cardiovascular: Regular rate rhythm, no peripheral edema, warm, nontender. Eyes: Conjunctivae clear without exudates or hemorrhage Neck: Supple, no carotid bruits. Pulmonary: Clear to auscultation bilaterally   NEUROLOGICAL EXAM:  MENTAL STATUS: Speech:    Speech is normal; fluent and spontaneous with normal comprehension.  Cognition:     Orientation to time, place and person     Normal recent and remote memory     Normal Attention span and concentration     Normal Language, naming, repeating,spontaneous speech     Fund of knowledge   CRANIAL NERVES: CN II: Visual fields are full to confrontation. Pupils are round equal and briskly reactive to light. CN III, IV, VI: extraocular movement are normal. No ptosis. CN V: Facial sensation is intact to light touch CN VII: Face is symmetric with normal eye closure  CN VIII: Hearing is normal to causal conversation. CN IX, X: Phonation is normal. CN XI: Head turning and shoulder shrug are intact  MOTOR: She has limited range of motion of her right shoulder, muscle bulk and tone are normal. Muscle strength is normal.  REFLEXES: Reflexes are 2+ and symmetric at  the biceps, triceps, knees, and ankles. Plantar responses are flexor.  SENSORY: Intact to light touch, pinprick and vibratory sensation are intact in fingers and toes.  COORDINATION: There is no trunk or limb dysmetria noted.  GAIT/STANCE: Posture is normal. Gait is steady with normal steps, base, arm swing, and turning. Heel and toe walking are normal. Tandem gait is normal.  Romberg is absent.   DIAGNOSTIC DATA (LABS, IMAGING, TESTING) - I reviewed patient records, labs, notes, testing and imaging myself where available.   ASSESSMENT AND PLAN  Alicia Flowers is a 49 y.o. female    Acute stroke at right occipital lobe in September 2020  That is related to alcohol, cocaine abuse  She has deficit from her stroke, major limitation is from her right shoulder limited range of motion, pain, consistent with right shoulder pathology  She is able to go back to work without limitations, paperwork was completed,  Continue aspirin 81 mg daily Elevated A1c 6.2  Emphasize the importance of weight loss, moderate exercise, diet  Marcial Pacas, M.D. Ph.D.  [facility] [address] [place], [place] [address] Ph: [phone] Fax: [phone]  CC: Referring Provider

## 2020-03-12 ENCOUNTER — Ambulatory Visit: Payer: Medicaid Other | Admitting: Obstetrics and Gynecology

## 2020-09-05 ENCOUNTER — Other Ambulatory Visit: Payer: Self-pay

## 2020-09-05 ENCOUNTER — Other Ambulatory Visit: Payer: Medicaid Other

## 2020-09-05 DIAGNOSIS — Z20822 Contact with and (suspected) exposure to covid-19: Secondary | ICD-10-CM

## 2020-09-07 LAB — NOVEL CORONAVIRUS, NAA: SARS-CoV-2, NAA: NOT DETECTED

## 2020-09-07 LAB — SARS-COV-2, NAA 2 DAY TAT

## 2020-09-18 ENCOUNTER — Encounter: Payer: Self-pay | Admitting: Physician Assistant

## 2020-10-09 ENCOUNTER — Encounter: Payer: Self-pay | Admitting: Internal Medicine

## 2020-10-09 ENCOUNTER — Other Ambulatory Visit: Payer: Self-pay

## 2020-10-09 ENCOUNTER — Ambulatory Visit (INDEPENDENT_AMBULATORY_CARE_PROVIDER_SITE_OTHER): Payer: Medicaid Other | Admitting: Internal Medicine

## 2020-10-09 VITALS — BP 158/95 | HR 75 | Temp 97.2°F | Resp 18 | Ht 63.0 in | Wt 191.8 lb

## 2020-10-09 DIAGNOSIS — Z23 Encounter for immunization: Secondary | ICD-10-CM | POA: Diagnosis not present

## 2020-10-09 DIAGNOSIS — I639 Cerebral infarction, unspecified: Secondary | ICD-10-CM | POA: Diagnosis not present

## 2020-10-09 DIAGNOSIS — I10 Essential (primary) hypertension: Secondary | ICD-10-CM

## 2020-10-09 DIAGNOSIS — E785 Hyperlipidemia, unspecified: Secondary | ICD-10-CM

## 2020-10-09 DIAGNOSIS — E039 Hypothyroidism, unspecified: Secondary | ICD-10-CM

## 2020-10-09 DIAGNOSIS — Z124 Encounter for screening for malignant neoplasm of cervix: Secondary | ICD-10-CM

## 2020-10-09 DIAGNOSIS — E875 Hyperkalemia: Secondary | ICD-10-CM

## 2020-10-09 DIAGNOSIS — Z7689 Persons encountering health services in other specified circumstances: Secondary | ICD-10-CM | POA: Diagnosis not present

## 2020-10-09 DIAGNOSIS — M542 Cervicalgia: Secondary | ICD-10-CM

## 2020-10-09 DIAGNOSIS — E538 Deficiency of other specified B group vitamins: Secondary | ICD-10-CM

## 2020-10-09 MED ORDER — AMLODIPINE BESYLATE 10 MG PO TABS: 10.0000 mg | ORAL_TABLET | Freq: Every day | ORAL | 3 refills | Status: DC

## 2020-10-09 MED ORDER — ATENOLOL 25 MG PO TABS: 25.0000 mg | ORAL_TABLET | Freq: Every day | ORAL | 3 refills | Status: DC

## 2020-10-09 MED ORDER — FOLIC ACID 1 MG PO TABS: 1.0000 mg | ORAL_TABLET | Freq: Every day | ORAL | 2 refills | Status: DC

## 2020-10-09 MED ORDER — ASPIRIN 81 MG PO TBEC: 81.0000 mg | DELAYED_RELEASE_TABLET | Freq: Every day | ORAL | 3 refills | Status: DC

## 2020-10-09 MED ORDER — LEVOTHYROXINE SODIUM 50 MCG PO TABS: 50.0000 ug | ORAL_TABLET | Freq: Every day | ORAL | 2 refills | Status: DC

## 2020-10-09 MED ORDER — ATORVASTATIN CALCIUM 20 MG PO TABS: 20.0000 mg | ORAL_TABLET | Freq: Every day | ORAL | 3 refills | Status: DC

## 2020-10-09 NOTE — Patient Instructions (Signed)
Please continue to take medications as prescribed.  Please continue to perform moderate exercise/yoga/aerobic exercise, at least 150 mins/week. Please perform Trapezius muscle exercises as discussed for muscle strain.  Please follow DASH diet for better control of hypertension.  DASH stands for Dietary Approaches to Stop Hypertension. The DASH diet is a healthy-eating plan designed to help treat or prevent high blood pressure (hypertension).  The DASH diet includes foods that are rich in potassium, calcium and magnesium. These nutrients help control blood pressure. The diet limits foods that are high in sodium, saturated fat and added sugars.  Studies have shown that the DASH diet can lower blood pressure in as little as two weeks. The diet can also lower low-density lipoprotein (LDL or "bad") cholesterol levels in the blood. High blood pressure and high LDL cholesterol levels are two major risk factors for heart disease and stroke.    DASH diet: Recommended servings The DASH diet provides daily and weekly nutritional goals. The number of servings you should have depends on your daily calorie needs.  Here's a look at the recommended servings from each food group for a 2,000-calorie-a-day DASH diet:  Grains: 6 to 8 servings a day. One serving is one slice bread, 1 ounce dry cereal, or 1/2 cup cooked cereal, rice or pasta. Vegetables: 4 to 5 servings a day. One serving is 1 cup raw leafy green vegetable, 1/2 cup cut-up raw or cooked vegetables, or 1/2 cup vegetable juice. Fruits: 4 to 5 servings a day. One serving is one medium fruit, 1/2 cup fresh, frozen or canned fruit, or 1/2 cup fruit juice. Fat-free or low-fat dairy products: 2 to 3 servings a day. One serving is 1 cup milk or yogurt, or 1 1/2 ounces cheese. Lean meats, poultry and fish: six 1-ounce servings or fewer a day. One serving is 1 ounce cooked meat, poultry or fish, or 1 egg. Nuts, seeds and legumes: 4 to 5 servings a week. One  serving is 1/3 cup nuts, 2 tablespoons peanut butter, 2 tablespoons seeds, or 1/2 cup cooked legumes (dried beans or peas). Fats and oils: 2 to 3 servings a day. One serving is 1 teaspoon soft margarine, 1 teaspoon vegetable oil, 1 tablespoon mayonnaise or 2 tablespoons salad dressing. Sweets and added sugars: 5 servings or fewer a week. One serving is 1 tablespoon sugar, jelly or jam, 1/2 cup sorbet, or 1 cup lemonade.

## 2020-10-12 DIAGNOSIS — Z7689 Persons encountering health services in other specified circumstances: Secondary | ICD-10-CM | POA: Insufficient documentation

## 2020-10-12 NOTE — Assessment & Plan Note (Signed)
Lab Results  Component Value Date   TSH 0.951 10/24/2019   Check TSH Continue Levothyroxine 50 mcg QD for now, refill provided

## 2020-10-12 NOTE — Assessment & Plan Note (Addendum)
BP Readings from Last 3 Encounters:  10/09/20 (!) 158/95  02/01/20 (!) 139/91  01/08/20 140/85   Noncompliant Restart Amlodipine 10 mg QD and Atenolol 25 mg QD, counseled for compliance DASH diet and moderate exercise as tolerated Check CMP and lipid profile

## 2020-10-12 NOTE — Assessment & Plan Note (Signed)
In 08/2019 On Aspirin and statin Used to use cocaine in the past, likely etiology of CVA in her case Neurology charts reviewed

## 2020-10-12 NOTE — Progress Notes (Signed)
New Patient Office Visit  Subjective:  Patient ID: Alicia Flowers, female    DOB: Jan 05, 1971  Age: 49 y.o. MRN: [id number]  CC:  Chief Complaint  Patient presents with  . New Patient (Initial Visit)    new pt former dr Holly Bodily pt has been having some tense feeling in her left side comes and goes also some swelling in her feet has been out of all medications for a few months.     HPI Alicia Flowers is a 49 year old female with past medical history of CVA, hypertension, hypothyroidism and hyperlipidemia presents for establishing care.  Patient had run out of her medications few months ago and has not had follow-up with her PCP.  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  Patient has been taking aspirin and statin since then.  Patient also used to take amlodipine and atenolol for her hypertension and levothyroxine for her hypothyroidism.  Patient currently denies any headache, dizziness, chest pain, dyspnea or palpitations.  Patient denies any recent appetite or weight change.  Patient denies excessive sweating, polyuria or polyphagia.  Patient complains of right sided neck and right shoulder tightness.  It is worse with movement and better with Tylenol and rest.  Patient denies any numbness or tingling in upper extremities.  Of note, patient works as a Clinical research associate at [employer].  Patient's BP in the office today is 158/95.  She has not been taking her medications for last few months.  Patient has not had Pap smear done for at least 3 years.  Patient has had Covid vaccination.  Patient received flu vaccine in the office today.  Past Medical History:  Diagnosis Date  . Anxiety   . Depression   . GERD (gastroesophageal reflux disease)   . Hyperlipidemia   . Hypertension 09/04/2019  . Stroke ([place]) 09/04/2019  . Substance abuse ([place])   . Tennis elbow   . Thyroid disease     Past Surgical History:  Procedure Laterality Date  . APPENDECTOMY      . CENTRAL VENOUS CATHETER INSERTION N/A 09/15/2019   Procedure: MINOR INSERTION CENTRAL LINE ADULT;  Surgeon: Aviva Signs, MD;  Location: [facility];  Service: General;  Laterality: N/A;  . CESAREAN SECTION    . IR FLUORO GUIDE CV LINE RIGHT  09/21/2019  . IR US GUIDE VASC ACCESS RIGHT  09/21/2019  . TUBAL LIGATION      Family History  Problem Relation Age of Onset  . Hypertension Mother   . Cancer Mother        breast cancer  . Healthy Mother   . Hypertension Father   . Hypertension Maternal Grandmother   . Hypertension Maternal Grandfather   . Healthy Sister   . Healthy Brother   . Hyperlipidemia Brother   . Hypertension Brother     Social History   Socioeconomic History  . Marital status: Single    Spouse name: Not on file  . Number of children: 2  . Years of education: two years college  . Highest education level: Not on file  Occupational History  . Occupation: Paediatric nurse - stocker  Tobacco Use  . Smoking status: Current Every Day Smoker    Packs/day: 0.50    Years: 27.00    Pack years: 13.50    Types: Cigarettes    Last attempt to quit: 08/2019    Years since quitting: 1.1  . Smokeless tobacco: Never Used  Vaping Use  .  Vaping Use: Never used  Substance and Sexual Activity  . Alcohol use: Yes    Comment: She was drinking whiskey daily but not since CVA., occasionally now  . Drug use: Yes    Types: Marijuana    Comment: Previous h/o cocaine use  . Sexual activity: Yes    Birth control/protection: Surgical  Other Topics Concern  . Not on file  Social History Narrative   Lives at home with parents.   Right-handed.   1 cup caffeine per day.   Social Determinants of Health   Financial Resource Strain:   . Difficulty of Paying Living Expenses: Not on file  Food Insecurity:   . Worried About Charity fundraiser in the Last Year: Not on file  . Ran Out of Food in the Last Year: Not on file  Transportation Needs:   . Lack of Transportation (Medical): Not on  file  . Lack of Transportation (Non-Medical): Not on file  Physical Activity:   . Days of Exercise per Week: Not on file  . Minutes of Exercise per Session: Not on file  Stress:   . Feeling of Stress : Not on file  Social Connections:   . Frequency of Communication with Friends and Family: Not on file  . Frequency of Social Gatherings with Friends and Family: Not on file  . Attends Religious Services: Not on file  . Active Member of Clubs or Organizations: Not on file  . Attends Archivist Meetings: Not on file  . Marital Status: Not on file  Intimate Partner Violence:   . Fear of Current or Ex-Partner: Not on file  . Emotionally Abused: Not on file  . Physically Abused: Not on file  . Sexually Abused: Not on file    ROS Review of Systems  Constitutional: Negative for chills and fever.  HENT: Negative for congestion, sinus pressure, sinus pain and sore throat.   Eyes: Negative for pain and discharge.  Respiratory: Negative for cough and shortness of breath.   Cardiovascular: Negative for chest pain and palpitations.  Gastrointestinal: Negative for abdominal pain, constipation, diarrhea, nausea and vomiting.  Endocrine: Negative for polydipsia and polyuria.  Genitourinary: Negative for dysuria and hematuria.  Musculoskeletal: Positive for neck pain. Negative for gait problem and neck stiffness.  Skin: Negative for rash.  Neurological: Negative for dizziness, syncope, speech difficulty, weakness, numbness and headaches.  Psychiatric/Behavioral: Negative for agitation and behavioral problems.    Objective:   Today's Vitals: BP (!) 158/95 (BP Location: Right Arm, Patient Position: Sitting, Cuff Size: Normal)   Pulse 75   Temp (!) 97.2 F (36.2 C) (Temporal)   Resp 18   Ht 5' 3"  (1.6 m)   Wt 191 lb 12.8 oz (87 kg)   SpO2 99%   BMI 33.98 kg/m   Physical Exam Vitals reviewed.  Constitutional:      General: She is not in acute distress.    Appearance: She is  not diaphoretic.  HENT:     Head: Normocephalic and atraumatic.     Nose: Nose normal.     Mouth/Throat:     Mouth: Mucous membranes are moist.  Eyes:     General: No scleral icterus.    Extraocular Movements: Extraocular movements intact.     Pupils: Pupils are equal, round, and reactive to light.  Cardiovascular:     Rate and Rhythm: Normal rate and regular rhythm.     Pulses: Normal pulses.     Heart sounds: Normal heart  sounds. No murmur heard.  No friction rub. No gallop.   Pulmonary:     Breath sounds: Normal breath sounds. No wheezing or rales.  Abdominal:     Palpations: Abdomen is soft.     Tenderness: There is no abdominal tenderness.  Musculoskeletal:     Cervical back: Normal range of motion and neck supple. No rigidity or tenderness.     Right lower leg: No edema.     Left lower leg: No edema.  Skin:    General: Skin is warm.     Findings: No rash.  Neurological:     General: No focal deficit present.     Mental Status: She is alert and oriented to person, place, and time.     Sensory: No sensory deficit.     Motor: No weakness.  Psychiatric:        Mood and Affect: Mood normal.        Behavior: Behavior normal.      Assessment & Plan:   Problem List Items Addressed This Visit      Encounter to establish care - Primary   Care established Previous chart reviewed History and medications reviewed with the patient     Relevant Orders  CMP14+EGFR  CBC with Differential  Lipid Profile  T4 AND TSH  HgB A1c    Cardiovascular and Mediastinum   Cerebrovascular accident (CVA) ([place])    In 08/2019 On Aspirin and statin Used to use cocaine in the past, likely etiology of CVA in her case Neurology charts reviewed      Relevant Medications   amLODipine (NORVASC) 10 MG tablet   aspirin 81 MG EC tablet   atenolol (TENORMIN) 25 MG tablet   atorvastatin (LIPITOR) 20 MG tablet     Essential hypertension    BP Readings from Last 3 Encounters:  10/09/20  (!) 158/95  02/01/20 (!) 139/91  01/08/20 140/85   Noncompliant Restart Amlodipine 10 mg QD and Atenolol 25 mg QD, counseled for compliance DASH diet and moderate exercise as tolerated Check CMP and lipid profile      Relevant Medications   amLODipine (NORVASC) 10 MG tablet   aspirin 81 MG EC tablet   atenolol (TENORMIN) 25 MG tablet   atorvastatin (LIPITOR) 20 MG tablet     Endocrine   Hypothyroidism    Lab Results  Component Value Date   TSH 0.951 10/24/2019   Check TSH Continue Levothyroxine 50 mcg QD for now, refill provided      Relevant Medications   atenolol (TENORMIN) 25 MG tablet   levothyroxine (SYNTHROID) 50 MCG tablet     Other      Hyperlipidemia    Lab Results  Component Value Date   CHOL 159 09/15/2019   HDL 31 (L) 09/15/2019   LDLCALC 80 09/15/2019   TRIG 241 (H) 09/15/2019   CHOLHDL 5.1 09/15/2019   On statin       Neck pain, likely related to trapezius muscle strain Works at [employer] at [employer] to avoid sudden neck movements Discussed simple neck exercises Tylenol PRN and heating pads              Other Visit Diagnoses    Need for immunization against influenza       Relevant Orders   Flu Vaccine QUAD 36+ mos IM (Completed)   Routine cervical smear       Relevant Orders   Ambulatory referral to Obstetrics / Gynecology   Folic acid deficiency  Relevant Medications   folic acid (FOLVITE) 1 MG tablet      Outpatient Encounter Medications as of 10/09/2020  Medication Sig  . amLODipine (NORVASC) 10 MG tablet Take 1 tablet (10 mg total) by mouth daily.  Marland Kitchen aspirin 81 MG EC tablet Take 1 tablet (81 mg total) by mouth daily.  Marland Kitchen atenolol (TENORMIN) 25 MG tablet Take 1 tablet (25 mg total) by mouth daily.  Marland Kitchen atorvastatin (LIPITOR) 20 MG tablet Take 1 tablet (20 mg total) by mouth daily at 6 PM.  . docusate sodium (COLACE) 100 MG capsule Take 100 mg by mouth daily as needed for mild constipation. (Patient not taking: Reported  on 10/09/2020)  . folic acid (FOLVITE) 1 MG tablet Take 1 tablet (1 mg total) by mouth daily.  Marland Kitchen levothyroxine (SYNTHROID) 50 MCG tablet Take 1 tablet (50 mcg total) by mouth daily at 6 (six) AM.  . pantoprazole (PROTONIX) 40 MG tablet Take 1 tablet (40 mg total) by mouth daily. (Patient not taking: Reported on 10/09/2020)  . [DISCONTINUED] amLODipine (NORVASC) 10 MG tablet Take 1 tablet (10 mg total) by mouth daily. (Patient not taking: Reported on 10/09/2020)  . [DISCONTINUED] aspirin EC 81 MG EC tablet Take 1 tablet (81 mg total) by mouth daily. (Patient not taking: Reported on 10/09/2020)  . [DISCONTINUED] atenolol (TENORMIN) 25 MG tablet Take 1 tablet (25 mg total) by mouth daily. (Patient not taking: Reported on 10/09/2020)  . [DISCONTINUED] atorvastatin (LIPITOR) 20 MG tablet Take 1 tablet (20 mg total) by mouth daily at 6 PM. (Patient not taking: Reported on 10/09/2020)  . [DISCONTINUED] folic acid (FOLVITE) 1 MG tablet Take 1 tablet (1 mg total) by mouth daily. (Patient not taking: Reported on 10/09/2020)  . [DISCONTINUED] levothyroxine (SYNTHROID) 50 MCG tablet Take 1 tablet (50 mcg total) by mouth daily at 6 (six) AM. (Patient not taking: Reported on 10/09/2020)   No facility-administered encounter medications on file as of 10/09/2020.    Follow-up: No follow-ups on file.   Lindell Spar, MD

## 2020-10-12 NOTE — Assessment & Plan Note (Signed)
Care established Previous chart reviewed History and medications reviewed with the patient 

## 2020-10-12 NOTE — Assessment & Plan Note (Signed)
Lab Results  Component Value Date   CHOL 159 09/15/2019   HDL 31 (L) 09/15/2019   LDLCALC 80 09/15/2019   TRIG 241 (H) 09/15/2019   CHOLHDL 5.1 09/15/2019   On statin

## 2020-10-16 ENCOUNTER — Other Ambulatory Visit (HOSPITAL_COMMUNITY)
Admission: RE | Admit: 2020-10-16 | Discharge: 2020-10-16 | Disposition: A | Discharge status: Home / Self Care | Payer: Medicaid Other | Source: Ambulatory Visit | Attending: Internal Medicine | Admitting: Internal Medicine

## 2020-10-16 ENCOUNTER — Other Ambulatory Visit: Payer: Self-pay

## 2020-10-16 DIAGNOSIS — Z7689 Persons encountering health services in other specified circumstances: Secondary | ICD-10-CM | POA: Diagnosis not present

## 2020-10-16 LAB — COMPREHENSIVE METABOLIC PANEL
ALT: 17 U/L (ref 0–44)
AST: 21 U/L (ref 15–41)
Albumin: 3.5 g/dL (ref 3.5–5.0)
Alkaline Phosphatase: 70 U/L (ref 38–126)
Anion gap: 9 (ref 5–15)
BUN: 9 mg/dL (ref 6–20)
CO2: 22 mmol/L (ref 22–32)
Calcium: 8.5 mg/dL — ABNORMAL LOW (ref 8.9–10.3)
Chloride: 107 mmol/L (ref 98–111)
Creatinine, Ser: 0.74 mg/dL (ref 0.44–1.00)
GFR, Estimated: >60 mL/min (ref >60)
Glucose, Bld: 106 mg/dL — ABNORMAL HIGH (ref 70–99)
Potassium: 3.7 mmol/L (ref 3.5–5.1)
Sodium: 138 mmol/L (ref 135–145)
Total Bilirubin: 0.4 mg/dL (ref 0.3–1.2)
Total Protein: 6.8 g/dL (ref 6.5–8.1)

## 2020-10-16 LAB — CBC WITH DIFFERENTIAL/PLATELET
Abs Immature Granulocytes: 0.01 10*3/uL (ref 0.00–0.07)
Basophils Absolute: 0 10*3/uL (ref 0.0–0.1)
Basophils Relative: 1 %
Eosinophils Absolute: 0.1 10*3/uL (ref 0.0–0.5)
Eosinophils Relative: 2 %
HCT: 36.8 % (ref 36.0–46.0)
Hemoglobin: 12.3 g/dL (ref 12.0–15.0)
Immature Granulocytes: 0 %
Lymphocytes Relative: 39 %
Lymphs Abs: 2 10*3/uL (ref 0.7–4.0)
MCH: 28.9 pg (ref 26.0–34.0)
MCHC: 33.4 g/dL (ref 30.0–36.0)
MCV: 86.4 fL (ref 80.0–100.0)
Monocytes Absolute: 0.6 10*3/uL (ref 0.1–1.0)
Monocytes Relative: 12 %
Neutro Abs: 2.5 10*3/uL (ref 1.7–7.7)
Neutrophils Relative %: 46 %
Platelets: 343 10*3/uL (ref 150–400)
RBC: 4.26 MIL/uL (ref 3.87–5.11)
RDW: 16.2 % — ABNORMAL HIGH (ref 11.5–15.5)
WBC: 5.2 10*3/uL (ref 4.0–10.5)
nRBC: 0 % (ref 0.0–0.2)

## 2020-10-16 LAB — LIPID PANEL
Cholesterol: 164 mg/dL (ref 0–200)
HDL: 28 mg/dL — ABNORMAL LOW (ref >40)
LDL Cholesterol: 121 mg/dL — ABNORMAL HIGH (ref 0–99)
Total CHOL/HDL Ratio: 5.9 RATIO
Triglycerides: 77 mg/dL (ref <150)
VLDL: 15 mg/dL (ref 0–40)

## 2020-10-16 LAB — TSH: TSH: 2.098 u[IU]/mL (ref 0.350–4.500)

## 2020-10-17 LAB — HEMOGLOBIN A1C
Hgb A1c MFr Bld: 5.7 % — ABNORMAL HIGH (ref 4.8–5.6)
Mean Plasma Glucose: 117 mg/dL

## 2020-10-17 LAB — T4: T4, Total: 5.3 ug/dL (ref 4.5–12.0)

## 2020-11-04 ENCOUNTER — Encounter: Payer: Self-pay | Admitting: Obstetrics & Gynecology

## 2020-11-04 ENCOUNTER — Ambulatory Visit (INDEPENDENT_AMBULATORY_CARE_PROVIDER_SITE_OTHER): Payer: Medicaid Other | Admitting: Obstetrics & Gynecology

## 2020-11-04 ENCOUNTER — Other Ambulatory Visit (HOSPITAL_COMMUNITY)
Admission: RE | Admit: 2020-11-04 | Discharge: 2020-11-04 | Disposition: A | Discharge status: Home / Self Care | Payer: Medicaid Other | Source: Ambulatory Visit | Attending: Obstetrics & Gynecology | Admitting: Obstetrics & Gynecology

## 2020-11-04 VITALS — BP 165/94 | HR 79 | Ht 63.0 in | Wt 190.0 lb

## 2020-11-04 DIAGNOSIS — Z01419 Encounter for gynecological examination (general) (routine) without abnormal findings: Secondary | ICD-10-CM

## 2020-11-04 NOTE — Progress Notes (Signed)
Subjective:     Alicia Flowers is a 49 y.o. female here for a routine exam.  Patient's last menstrual period was 10/10/2020. G2P2 Birth Control Method:  Tubal ligation Menstrual Calendar(currently): regular periods, 4-5d  Current complaints: no.   Current acute medical issues:     Recent Gynecologic History Patient's last menstrual period was 10/10/2020. Last Pap: 2012,  normal Last mammogram: many years ago will schedule,    Past Medical History:  Diagnosis Date  . Anxiety   . Depression   . GERD (gastroesophageal reflux disease)   . Hyperlipidemia   . Hypertension 09/04/2019  . Stroke ([place]) 09/04/2019  . Substance abuse ([place])   . Tennis elbow   . Thyroid disease     Past Surgical History:  Procedure Laterality Date  . APPENDECTOMY    . CENTRAL VENOUS CATHETER INSERTION N/A 09/15/2019   Procedure: MINOR INSERTION CENTRAL LINE ADULT;  Surgeon: Aviva Signs, MD;  Location: [facility];  Service: General;  Laterality: N/A;  . CESAREAN SECTION    . IR FLUORO GUIDE CV LINE RIGHT  09/21/2019  . IR US GUIDE VASC ACCESS RIGHT  09/21/2019  . TUBAL LIGATION      OB History    Gravida  2   Para  2   Term      Preterm      AB      Living  2     SAB      TAB      Ectopic      Multiple      Live Births              Social History   Socioeconomic History  . Marital status: Single    Spouse name: Not on file  . Number of children: 2  . Years of education: two years college  . Highest education level: Not on file  Occupational History  . Occupation: Paediatric nurse - stocker  Tobacco Use  . Smoking status: Current Every Day Smoker    Packs/day: 0.50    Years: 27.00    Pack years: 13.50    Types: Cigarettes    Last attempt to quit: 08/2019    Years since quitting: 1.1  . Smokeless tobacco: Never Used  Vaping Use  . Vaping Use: Never used  Substance and Sexual Activity  . Alcohol use: Yes    Comment: She was drinking whiskey daily but not since CVA.,  occasionally now  . Drug use: Yes    Types: Marijuana    Comment: Previous h/o cocaine use  . Sexual activity: Yes    Birth control/protection: Surgical  Other Topics Concern  . Not on file  Social History Narrative   Lives at home with parents.   Right-handed.   1 cup caffeine per day.   Social Determinants of Health   Financial Resource Strain: Medium Risk  . Difficulty of Paying Living Expenses: Somewhat hard  Food Insecurity: Food Insecurity Present  . Worried About Charity fundraiser in the Last Year: Sometimes true  . Ran Out of Food in the Last Year: Never true  Transportation Needs: No Transportation Needs  . Lack of Transportation (Medical): No  . Lack of Transportation (Non-Medical): No  Physical Activity: Sufficiently Active  . Days of Exercise per Week: 6 days  . Minutes of Exercise per Session: 30 min  Stress: Stress Concern Present  . Feeling of Stress : To some extent  Social Connections: Moderately Integrated  .  Frequency of Communication with Friends and Family: Twice a week  . Frequency of Social Gatherings with Friends and Family: Once a week  . Attends Religious Services: More than 4 times per year  . Active Member of Clubs or Organizations: Yes  . Attends Archivist Meetings: More than 4 times per year  . Marital Status: Never married    Family History  Problem Relation Age of Onset  . Hypertension Mother   . Cancer Mother        breast cancer  . Healthy Mother   . Hypertension Father   . Hypertension Maternal Grandmother   . Hypertension Maternal Grandfather   . Healthy Sister   . Healthy Brother   . Hyperlipidemia Brother   . Hypertension Brother      Current Outpatient Medications:  .  amLODipine (NORVASC) 10 MG tablet, Take 1 tablet (10 mg total) by mouth daily., Disp: 30 tablet, Rfl: 3 .  aspirin 81 MG EC tablet, Take 1 tablet (81 mg total) by mouth daily., Disp: 30 tablet, Rfl: 3 .  atenolol (TENORMIN) 25 MG tablet, Take 1  tablet (25 mg total) by mouth daily., Disp: 30 tablet, Rfl: 3 .  atorvastatin (LIPITOR) 20 MG tablet, Take 1 tablet (20 mg total) by mouth daily at 6 PM., Disp: 30 tablet, Rfl: 3 .  folic acid (FOLVITE) 1 MG tablet, Take 1 tablet (1 mg total) by mouth daily., Disp: 30 tablet, Rfl: 2 .  levothyroxine (SYNTHROID) 50 MCG tablet, Take 1 tablet (50 mcg total) by mouth daily at 6 (six) AM., Disp: 30 tablet, Rfl: 2 .  docusate sodium (COLACE) 100 MG capsule, Take 100 mg by mouth daily as needed for mild constipation. (Patient not taking: Reported on 10/09/2020), Disp: , Rfl:  .  pantoprazole (PROTONIX) 40 MG tablet, Take 1 tablet (40 mg total) by mouth daily. (Patient not taking: Reported on 10/09/2020), Disp: 30 tablet, Rfl: 2  Review of Systems  Review of Systems  Constitutional: Negative for fever, chills, weight loss, malaise/fatigue and diaphoresis.  HENT: Negative for hearing loss, ear pain, nosebleeds, congestion, sore throat, neck pain, tinnitus and ear discharge.   Eyes: Negative for blurred vision, double vision, photophobia, pain, discharge and redness.  Respiratory: Negative for cough, hemoptysis, sputum production, shortness of breath, wheezing and stridor.   Cardiovascular: Negative for chest pain, palpitations, orthopnea, claudication, leg swelling and PND.  Gastrointestinal: negative for abdominal pain. Negative for heartburn, nausea, vomiting, diarrhea, constipation, blood in stool and melena.  Genitourinary: Negative for dysuria, urgency, frequency, hematuria and flank pain.  Musculoskeletal: Negative for myalgias, back pain, joint pain and falls.  Skin: Negative for itching and rash.  Neurological: Negative for dizziness, tingling, tremors, sensory change, speech change, focal weakness, seizures, loss of consciousness, weakness and headaches.  Endo/Heme/Allergies: Negative for environmental allergies and polydipsia. Does not bruise/bleed easily.  Psychiatric/Behavioral: Negative for  depression, suicidal ideas, hallucinations, memory loss and substance abuse. The patient is not nervous/anxious and does not have insomnia.        Objective:  Blood pressure (!) 165/94, pulse 79, height 5\' 3"  (1.6 m), weight 190 lb (86.2 kg), last menstrual period 10/10/2020.   Physical Exam  Vitals reviewed. Constitutional: She is oriented to person, place, and time. She appears well-developed and well-nourished.  GI: Soft. Bowel sounds are normal. She exhibits no distension and no mass. There is no tenderness. There is no rebound and no guarding.  Genitourinary:  Breasts no masses skin changes or nipple changes  bilaterally      Vulva is normal without lesions Vagina is pink moist without discharge Cervix normal in appearance and pap is done Uterus is normal size shape and contour Adnexa is negative with normal sized ovaries         Medications Ordered at today's visit: No orders of the defined types were placed in this encounter.   Other orders placed at today's visit: No orders of the defined types were placed in this encounter.     Assessment:    Normal Gyn exam.    Plan:    Contraception: tubal ligation. Mammogram ordered. Follow up in: 3 years.     No follow-ups on file.

## 2020-11-06 LAB — CYTOLOGY - PAP
Chlamydia: NEGATIVE
Comment: NEGATIVE
Comment: NEGATIVE
Comment: NORMAL
Diagnosis: NEGATIVE
High risk HPV: NEGATIVE
Neisseria Gonorrhea: NEGATIVE

## 2020-11-20 ENCOUNTER — Ambulatory Visit (INDEPENDENT_AMBULATORY_CARE_PROVIDER_SITE_OTHER): Payer: Medicaid Other | Admitting: Internal Medicine

## 2020-11-20 ENCOUNTER — Encounter: Payer: Self-pay | Admitting: Internal Medicine

## 2020-11-20 ENCOUNTER — Other Ambulatory Visit: Payer: Self-pay

## 2020-11-20 VITALS — BP 143/88 | HR 78 | Temp 99.3°F | Resp 20 | Ht 63.0 in | Wt 186.0 lb

## 2020-11-20 DIAGNOSIS — I10 Essential (primary) hypertension: Secondary | ICD-10-CM

## 2020-11-20 DIAGNOSIS — G5603 Carpal tunnel syndrome, bilateral upper limbs: Secondary | ICD-10-CM | POA: Insufficient documentation

## 2020-11-20 DIAGNOSIS — E785 Hyperlipidemia, unspecified: Secondary | ICD-10-CM

## 2020-11-20 DIAGNOSIS — R7303 Prediabetes: Secondary | ICD-10-CM

## 2020-11-20 DIAGNOSIS — E039 Hypothyroidism, unspecified: Secondary | ICD-10-CM

## 2020-11-20 NOTE — Assessment & Plan Note (Signed)
Advised to continue taking Atorvastatin Needs to take it also for h/o CVA and prediabetes

## 2020-11-20 NOTE — Assessment & Plan Note (Signed)
Lab Results  Component Value Date   HGBA1C 5.7 (H) 10/16/2020   Better now from HbA1C 6.2 on previous check Follow low carbohydrate diet

## 2020-11-20 NOTE — Assessment & Plan Note (Addendum)
BP Readings from Last 3 Encounters:  11/20/20 (!) 143/88  11/04/20 (!) 165/94  10/09/20 (!) 158/95   Noncompliant EKG: Sinus rhythm. HR 59. Low voltage QRS. No signs of active ischemia. Continue Amlodipine 10 mg QD and Atenolol 25 mg QD, counseled for compliance DASH diet and moderate exercise as tolerated

## 2020-11-20 NOTE — Patient Instructions (Signed)
Please take medications as prescribed.  Please start taking Vitamin B12 1000 mcg every day. Please use splint for carpal tunnel.   Please follow DASH diet and perform moderate exercise/walking at least 150 mins/week.  DASH stands for Dietary Approaches to Stop Hypertension. The DASH diet is a healthy-eating plan designed to help treat or prevent high blood pressure (hypertension).  The DASH diet includes foods that are rich in potassium, calcium and magnesium. These nutrients help control blood pressure. The diet limits foods that are high in sodium, saturated fat and added sugars.  Studies have shown that the DASH diet can lower blood pressure in as little as two weeks. The diet can also lower low-density lipoprotein (LDL or "bad") cholesterol levels in the blood. High blood pressure and high LDL cholesterol levels are two major risk factors for heart disease and stroke.    DASH diet: Recommended servings The DASH diet provides daily and weekly nutritional goals. The number of servings you should have depends on your daily calorie needs.  Here's a look at the recommended servings from each food group for a 2,000-calorie-a-day DASH diet:  Grains: 6 to 8 servings a day. One serving is one slice bread, 1 ounce dry cereal, or 1/2 cup cooked cereal, rice or pasta. Vegetables: 4 to 5 servings a day. One serving is 1 cup raw leafy green vegetable, 1/2 cup cut-up raw or cooked vegetables, or 1/2 cup vegetable juice. Fruits: 4 to 5 servings a day. One serving is one medium fruit, 1/2 cup fresh, frozen or canned fruit, or 1/2 cup fruit juice. Fat-free or low-fat dairy products: 2 to 3 servings a day. One serving is 1 cup milk or yogurt, or 1 1/2 ounces cheese. Lean meats, poultry and fish: six 1-ounce servings or fewer a day. One serving is 1 ounce cooked meat, poultry or fish, or 1 egg. Nuts, seeds and legumes: 4 to 5 servings a week. One serving is 1/3 cup nuts, 2 tablespoons peanut butter, 2  tablespoons seeds, or 1/2 cup cooked legumes (dried beans or peas). Fats and oils: 2 to 3 servings a day. One serving is 1 teaspoon soft margarine, 1 teaspoon vegetable oil, 1 tablespoon mayonnaise or 2 tablespoons salad dressing. Sweets and added sugars: 5 servings or fewer a week. One serving is 1 tablespoon sugar, jelly or jam, 1/2 cup sorbet, or 1 cup lemonade.

## 2020-11-20 NOTE — Assessment & Plan Note (Addendum)
Numbness and tingling in the first 4 digits of b/l UE H/o hypothyroidism Advised to use splint Vit B12 supplement trial for neuropathic symptoms

## 2020-11-20 NOTE — Progress Notes (Signed)
Established Patient Office Visit  Subjective:  Patient ID: Alicia Flowers, female    DOB: [birth year]  Age: 49 y.o. MRN: [id number]  CC:  Chief Complaint  Patient presents with  . Hypertension    HPI Alicia Flowers is a 49 year old female with past medical history of CVA, hypertension, hypothyroidism and hyperlipidemia presents for follow up of her blood pressure and blood tests.  BP is not controlled. Does not take medications regularly. Patient denies headache, dizziness, chest pain, dyspnea or palpitations.  She has stopped taking Atorvastatin as she thought it was causing her to have numbness and tingling in the arms. She is advised to continue taking it because of her history of CVA, hyperlipidemia and prediabetes.  She c/o numbness and tingling in the b/l hands, more in the first 4 digits. She denies any recent injury or insect bite.  Blood tests were reviewed with the patient in detail.   Past Medical History:  Diagnosis Date  . Anxiety   . CAP (community acquired pneumonia) 09/14/2019  . Depression   . GERD (gastroesophageal reflux disease)   . Hyperlipidemia   . Hypertension 09/04/2019  . Stroke ([place]) 09/04/2019  . Substance abuse ([place])   . Tennis elbow   . Thyroid disease     Past Surgical History:  Procedure Laterality Date  . APPENDECTOMY    . CENTRAL VENOUS CATHETER INSERTION N/A 09/15/2019   Procedure: MINOR INSERTION CENTRAL LINE ADULT;  Surgeon: Aviva Signs, MD;  Location: [facility];  Service: General;  Laterality: N/A;  . CESAREAN SECTION    . IR FLUORO GUIDE CV LINE RIGHT  09/21/2019  . IR US GUIDE VASC ACCESS RIGHT  09/21/2019  . TUBAL LIGATION      Family History  Problem Relation Age of Onset  . Hypertension Mother   . Cancer Mother        breast cancer  . Healthy Mother   . Hypertension Father   . Hypertension Maternal Grandmother   . Hypertension Maternal Grandfather   . Healthy Sister   . Healthy Brother   . Hyperlipidemia Brother    . Hypertension Brother     Social History   Socioeconomic History  . Marital status: Single    Spouse name: Not on file  . Number of children: 2  . Years of education: two years college  . Highest education level: Not on file  Occupational History  . Occupation: Paediatric nurse - stocker  Tobacco Use  . Smoking status: Current Every Day Smoker    Packs/day: 0.50    Years: 27.00    Pack years: 13.50    Types: Cigarettes    Last attempt to quit: 08/2019    Years since quitting: 1.2  . Smokeless tobacco: Never Used  Vaping Use  . Vaping Use: Never used  Substance and Sexual Activity  . Alcohol use: Yes    Comment: She was drinking whiskey daily but not since CVA., occasionally now  . Drug use: Yes    Types: Marijuana    Comment: Previous h/o cocaine use  . Sexual activity: Yes    Birth control/protection: Surgical  Other Topics Concern  . Not on file  Social History Narrative   Lives at home with parents.   Right-handed.   1 cup caffeine per day.   Social Determinants of Health   Financial Resource Strain: Medium Risk  . Difficulty of Paying Living Expenses: Somewhat hard  Food Insecurity: Food Insecurity Present  . Worried About  Running Out of Food in the Last Year: Sometimes true  . Ran Out of Food in the Last Year: Never true  Transportation Needs: No Transportation Needs  . Lack of Transportation (Medical): No  . Lack of Transportation (Non-Medical): No  Physical Activity: Sufficiently Active  . Days of Exercise per Week: 6 days  . Minutes of Exercise per Session: 30 min  Stress: Stress Concern Present  . Feeling of Stress : To some extent  Social Connections: Moderately Integrated  . Frequency of Communication with Friends and Family: Twice a week  . Frequency of Social Gatherings with Friends and Family: Once a week  . Attends Religious Services: More than 4 times per year  . Active Member of Clubs or Organizations: Yes  . Attends Archivist Meetings:  More than 4 times per year  . Marital Status: Never married  Intimate Partner Violence: Not At Risk  . Fear of Current or Ex-Partner: No  . Emotionally Abused: No  . Physically Abused: No  . Sexually Abused: No    Outpatient Medications Prior to Visit  Medication Sig Dispense Refill  . amLODipine (NORVASC) 10 MG tablet Take 1 tablet (10 mg total) by mouth daily. 30 tablet 3  . aspirin 81 MG EC tablet Take 1 tablet (81 mg total) by mouth daily. 30 tablet 3  . atenolol (TENORMIN) 25 MG tablet Take 1 tablet (25 mg total) by mouth daily. 30 tablet 3  . docusate sodium (COLACE) 100 MG capsule Take 100 mg by mouth daily as needed for mild constipation.     . folic acid (FOLVITE) 1 MG tablet Take 1 tablet (1 mg total) by mouth daily. 30 tablet 2  . levothyroxine (SYNTHROID) 50 MCG tablet Take 1 tablet (50 mcg total) by mouth daily at 6 (six) AM. 30 tablet 2  . pantoprazole (PROTONIX) 40 MG tablet Take 1 tablet (40 mg total) by mouth daily. 30 tablet 2  . atorvastatin (LIPITOR) 20 MG tablet Take 1 tablet (20 mg total) by mouth daily at 6 PM. (Patient not taking: Reported on 11/20/2020) 30 tablet 3   No facility-administered medications prior to visit.    No Known Allergies  ROS Review of Systems  Constitutional: Negative for chills and fever.  HENT: Negative for congestion, sinus pressure, sinus pain and sore throat.   Eyes: Negative for pain and discharge.  Respiratory: Negative for cough and shortness of breath.   Cardiovascular: Negative for chest pain and palpitations.  Gastrointestinal: Negative for abdominal pain, constipation, diarrhea, nausea and vomiting.  Endocrine: Negative for polydipsia and polyuria.  Genitourinary: Negative for dysuria and hematuria.  Musculoskeletal: Negative for gait problem, neck pain and neck stiffness.  Skin: Negative for rash.  Neurological: Positive for numbness (b/l hands, first 4 digits). Negative for dizziness, syncope, speech difficulty, weakness  and headaches.  Psychiatric/Behavioral: Negative for agitation and behavioral problems.      Objective:    Physical Exam Vitals reviewed.  Constitutional:      General: She is not in acute distress.    Appearance: She is not diaphoretic.  HENT:     Head: Normocephalic and atraumatic.     Nose: Nose normal. No congestion.     Mouth/Throat:     Mouth: Mucous membranes are moist.     Pharynx: No posterior oropharyngeal erythema.  Eyes:     General: No scleral icterus.    Extraocular Movements: Extraocular movements intact.     Pupils: Pupils are equal, round, and reactive  to light.  Cardiovascular:     Rate and Rhythm: Normal rate and regular rhythm.     Heart sounds: No murmur heard.   Pulmonary:     Breath sounds: Normal breath sounds. No wheezing or rales.  Abdominal:     Palpations: Abdomen is soft.     Tenderness: There is no abdominal tenderness.  Musculoskeletal:     Cervical back: Neck supple. No tenderness.     Right lower leg: No edema.     Left lower leg: No edema.  Skin:    General: Skin is warm.     Findings: No rash.  Neurological:     General: No focal deficit present.     Mental Status: She is alert and oriented to person, place, and time.  Psychiatric:        Mood and Affect: Mood normal.        Behavior: Behavior normal.      BP (!) 143/88 (BP Location: Left Arm, Patient Position: Sitting)   Pulse 78   Temp 99.3 F (37.4 C)   Resp 20   Ht 5\' 3"  (1.6 m)   Wt 186 lb (84.4 kg)   LMP 11/20/2020   SpO2 95%   BMI 32.95 kg/m  Wt Readings from Last 3 Encounters:  11/20/20 186 lb (84.4 kg)  11/04/20 190 lb (86.2 kg)  10/09/20 191 lb 12.8 oz (87 kg)     Health Maintenance Due  Topic Date Due  . COVID-19 Vaccine (1) Never done  . TETANUS/TDAP  Never done    There are no preventive care reminders to display for this patient.  Lab Results  Component Value Date   TSH 2.098 10/16/2020   Lab Results  Component Value Date   WBC 5.2  10/16/2020   HGB 12.3 10/16/2020   HCT 36.8 10/16/2020   MCV 86.4 10/16/2020   PLT 343 10/16/2020   Lab Results  Component Value Date   NA 138 10/16/2020   K 3.7 10/16/2020   CO2 22 10/16/2020   GLUCOSE 106 (H) 10/16/2020   BUN 9 10/16/2020   CREATININE 0.74 10/16/2020   BILITOT 0.4 10/16/2020   ALKPHOS 70 10/16/2020   AST 21 10/16/2020   ALT 17 10/16/2020   PROT 6.8 10/16/2020   ALBUMIN 3.5 10/16/2020   CALCIUM 8.5 (L) 10/16/2020   ANIONGAP 9 10/16/2020   Lab Results  Component Value Date   CHOL 164 10/16/2020   Lab Results  Component Value Date   HDL 28 (L) 10/16/2020   Lab Results  Component Value Date   LDLCALC 121 (H) 10/16/2020   Lab Results  Component Value Date   TRIG 77 10/16/2020   Lab Results  Component Value Date   CHOLHDL 5.9 10/16/2020   Lab Results  Component Value Date   HGBA1C 5.7 (H) 10/16/2020      Assessment & Plan:   Problem List Items Addressed This Visit      Cardiovascular and Mediastinum   Essential hypertension - Primary    BP Readings from Last 3 Encounters:  11/20/20 (!) 143/88  11/04/20 (!) 165/94  10/09/20 (!) 158/95   Noncompliant EKG: Sinus rhythm. HR 59. Low voltage QRS. No signs of active ischemia. Continue Amlodipine 10 mg QD and Atenolol 25 mg QD, counseled for compliance DASH diet and moderate exercise as tolerated        Endocrine   Hypothyroidism    TSH wnl Continue Levothyroxine 50 mcg QD  Nervous and Auditory   Bilateral carpal tunnel syndrome    Numbness and tingling in the first 4 digits of b/l UE H/o hypothyroidism Advised to use splint Vit B12 supplement trial for neuropathic symptoms        Other   Hyperlipidemia    Advised to continue taking Atorvastatin Needs to take it also for h/o CVA and prediabetes      Prediabetes    Lab Results  Component Value Date   HGBA1C 5.7 (H) 10/16/2020   Better now from HbA1C 6.2 on previous check Follow low carbohydrate diet         Patient is counseled to quit smoking and take the COVID vaccine as soon as possible.  No orders of the defined types were placed in this encounter.   Follow-up: Return in about 3 months (around 02/20/2021).    Lindell Spar, MD

## 2020-11-20 NOTE — Assessment & Plan Note (Signed)
TSH wnl Continue Levothyroxine 50 mcg QD

## 2021-02-20 ENCOUNTER — Encounter (INDEPENDENT_AMBULATORY_CARE_PROVIDER_SITE_OTHER): Payer: Self-pay | Admitting: *Deleted

## 2021-02-20 ENCOUNTER — Ambulatory Visit (INDEPENDENT_AMBULATORY_CARE_PROVIDER_SITE_OTHER): Payer: Medicaid Other | Admitting: Internal Medicine

## 2021-02-20 ENCOUNTER — Encounter: Payer: Self-pay | Admitting: Internal Medicine

## 2021-02-20 ENCOUNTER — Other Ambulatory Visit: Payer: Self-pay

## 2021-02-20 VITALS — BP 134/87 | HR 73 | Temp 97.6°F | Ht 63.0 in | Wt 180.0 lb

## 2021-02-20 DIAGNOSIS — Z1211 Encounter for screening for malignant neoplasm of colon: Secondary | ICD-10-CM

## 2021-02-20 DIAGNOSIS — E039 Hypothyroidism, unspecified: Secondary | ICD-10-CM | POA: Diagnosis not present

## 2021-02-20 DIAGNOSIS — K219 Gastro-esophageal reflux disease without esophagitis: Secondary | ICD-10-CM

## 2021-02-20 DIAGNOSIS — E785 Hyperlipidemia, unspecified: Secondary | ICD-10-CM

## 2021-02-20 DIAGNOSIS — R7303 Prediabetes: Secondary | ICD-10-CM

## 2021-02-20 DIAGNOSIS — I10 Essential (primary) hypertension: Secondary | ICD-10-CM

## 2021-02-20 NOTE — Assessment & Plan Note (Signed)
BP Readings from Last 3 Encounters:  02/20/21 134/87  11/20/20 (!) 143/88  11/04/20 (!) 165/94   Noncompliant Continue Amlodipine 10 mg QD and Atenolol 25 mg QD, counseled for compliance DASH diet and moderate exercise as tolerated

## 2021-02-20 NOTE — Assessment & Plan Note (Signed)
On Atorvastatin Check lipid profile 

## 2021-02-20 NOTE — Assessment & Plan Note (Signed)
On Pantoprazole 

## 2021-02-20 NOTE — Patient Instructions (Signed)
Please continue to take medications regularly.  Please follow low salt and low carbohydrate diet and perform moderate exercise/walking at least 150 mins/week.  Please get fasting blood tests done before the next visit.

## 2021-02-20 NOTE — Progress Notes (Signed)
Established Patient Office Visit  Subjective:  Patient ID: Alicia Flowers, female    DOB: June 02, 1971  Age: 50 y.o. MRN: [id number]  CC:  Chief Complaint  Patient presents with  . Hypertension    Follow up    HPI Alicia Flowers is a 50 year old female with past medical history of CVA, hypertension, hypothyroidism and hyperlipidemia who presents for follow up of her chronic medical conditions.  BP is well-controlled today, but does not take medications regularly. Patient denies headache, dizziness, chest pain, dyspnea or palpitations.  She is on Levothyroxine for hypothyroidism. She denies any weight or appetite change, hair or skin changes.  She had PAP smear, which was normal.   Past Medical History:  Diagnosis Date  . Anxiety   . CAP (community acquired pneumonia) 09/14/2019  . Depression   . GERD (gastroesophageal reflux disease)   . Hyperlipidemia   . Hypertension 09/04/2019  . Stroke ([place]) 09/04/2019  . Substance abuse ([place])   . Tennis elbow   . Thyroid disease     Past Surgical History:  Procedure Laterality Date  . APPENDECTOMY    . CENTRAL VENOUS CATHETER INSERTION N/A 09/15/2019   Procedure: MINOR INSERTION CENTRAL LINE ADULT;  Surgeon: Aviva Signs, MD;  Location: [facility];  Service: General;  Laterality: N/A;  . CESAREAN SECTION    . IR FLUORO GUIDE CV LINE RIGHT  09/21/2019  . IR US GUIDE VASC ACCESS RIGHT  09/21/2019  . TUBAL LIGATION      Family History  Problem Relation Age of Onset  . Hypertension Mother   . Cancer Mother        breast cancer  . Healthy Mother   . Hypertension Father   . Hypertension Maternal Grandmother   . Hypertension Maternal Grandfather   . Healthy Sister   . Healthy Brother   . Hyperlipidemia Brother   . Hypertension Brother     Social History   Socioeconomic History  . Marital status: Single    Spouse name: Not on file  . Number of children: 2  . Years of education: two years college  . Highest education  level: Not on file  Occupational History  . Occupation: Paediatric nurse - stocker  Tobacco Use  . Smoking status: Current Every Day Smoker    Packs/day: 0.50    Years: 27.00    Pack years: 13.50    Types: Cigarettes    Last attempt to quit: 08/2019    Years since quitting: 1.4  . Smokeless tobacco: Never Used  Vaping Use  . Vaping Use: Never used  Substance and Sexual Activity  . Alcohol use: Yes    Comment: She was drinking whiskey daily but not since CVA., occasionally now  . Drug use: Yes    Types: Marijuana    Comment: Previous h/o cocaine use  . Sexual activity: Yes    Birth control/protection: Surgical  Other Topics Concern  . Not on file  Social History Narrative   Lives at home with parents.   Right-handed.   1 cup caffeine per day.   Social Determinants of Health   Financial Resource Strain: Medium Risk  . Difficulty of Paying Living Expenses: Somewhat hard  Food Insecurity: Food Insecurity Present  . Worried About Charity fundraiser in the Last Year: Sometimes true  . Ran Out of Food in the Last Year: Never true  Transportation Needs: No Transportation Needs  . Lack of Transportation (Medical): No  . Lack of Transportation (  Non-Medical): No  Physical Activity: Sufficiently Active  . Days of Exercise per Week: 6 days  . Minutes of Exercise per Session: 30 min  Stress: Stress Concern Present  . Feeling of Stress : To some extent  Social Connections: Moderately Integrated  . Frequency of Communication with Friends and Family: Twice a week  . Frequency of Social Gatherings with Friends and Family: Once a week  . Attends Religious Services: More than 4 times per year  . Active Member of Clubs or Organizations: Yes  . Attends Archivist Meetings: More than 4 times per year  . Marital Status: Never married  Intimate Partner Violence: Not At Risk  . Fear of Current or Ex-Partner: No  . Emotionally Abused: No  . Physically Abused: No  . Sexually Abused: No     Outpatient Medications Prior to Visit  Medication Sig Dispense Refill  . amLODipine (NORVASC) 10 MG tablet Take 1 tablet (10 mg total) by mouth daily. 30 tablet 3  . aspirin 81 MG EC tablet Take 1 tablet (81 mg total) by mouth daily. 30 tablet 3  . atenolol (TENORMIN) 25 MG tablet Take 1 tablet (25 mg total) by mouth daily. 30 tablet 3  . atorvastatin (LIPITOR) 20 MG tablet Take 1 tablet (20 mg total) by mouth daily at 6 PM. 30 tablet 3  . docusate sodium (COLACE) 100 MG capsule Take 100 mg by mouth daily as needed for mild constipation.     . folic acid (FOLVITE) 1 MG tablet Take 1 tablet (1 mg total) by mouth daily. 30 tablet 2  . levothyroxine (SYNTHROID) 50 MCG tablet Take 1 tablet (50 mcg total) by mouth daily at 6 (six) AM. 30 tablet 2  . pantoprazole (PROTONIX) 40 MG tablet Take 1 tablet (40 mg total) by mouth daily. 30 tablet 2   No facility-administered medications prior to visit.    No Known Allergies  ROS Review of Systems  Constitutional: Negative for chills and fever.  HENT: Negative for congestion, sinus pressure, sinus pain and sore throat.   Eyes: Negative for pain and discharge.  Respiratory: Negative for cough and shortness of breath.   Cardiovascular: Negative for chest pain and palpitations.  Gastrointestinal: Negative for abdominal pain, constipation, diarrhea, nausea and vomiting.  Endocrine: Negative for polydipsia and polyuria.  Genitourinary: Negative for dysuria and hematuria.  Musculoskeletal: Negative for gait problem, neck pain and neck stiffness.  Skin: Negative for rash.  Neurological: Positive for numbness (b/l hands, first 4 digits). Negative for dizziness, syncope, speech difficulty, weakness and headaches.  Psychiatric/Behavioral: Negative for agitation and behavioral problems.      Objective:    Physical Exam Vitals reviewed.  Constitutional:      General: She is not in acute distress.    Appearance: She is not diaphoretic.  HENT:      Head: Normocephalic and atraumatic.     Nose: Nose normal. No congestion.     Mouth/Throat:     Mouth: Mucous membranes are moist.     Pharynx: No posterior oropharyngeal erythema.  Eyes:     General: No scleral icterus.    Extraocular Movements: Extraocular movements intact.     Pupils: Pupils are equal, round, and reactive to light.  Cardiovascular:     Rate and Rhythm: Normal rate and regular rhythm.     Heart sounds: No murmur heard.   Pulmonary:     Breath sounds: Normal breath sounds. No wheezing or rales.  Abdominal:  Palpations: Abdomen is soft.     Tenderness: There is no abdominal tenderness.  Musculoskeletal:     Cervical back: Neck supple. No tenderness.     Right lower leg: No edema.     Left lower leg: No edema.  Skin:    General: Skin is warm.     Findings: No rash.  Neurological:     General: No focal deficit present.     Mental Status: She is alert and oriented to person, place, and time.  Psychiatric:        Mood and Affect: Mood normal.        Behavior: Behavior normal.     BP 134/87 (BP Location: Right Arm, Patient Position: Sitting)   Pulse 73   Temp 97.6 F (36.4 C) (Temporal)   Ht '5\' 3"'$  (1.6 m)   Wt 180 lb (81.6 kg)   LMP 02/07/2021   SpO2 98%   BMI 31.89 kg/m  Wt Readings from Last 3 Encounters:  02/20/21 180 lb (81.6 kg)  11/20/20 186 lb (84.4 kg)  11/04/20 190 lb (86.2 kg)     Health Maintenance Due  Topic Date Due  . TETANUS/TDAP  Never done  . COLONOSCOPY (Pts 45-28yr Insurance coverage will need to be confirmed)  Never done    There are no preventive care reminders to display for this patient.  Lab Results  Component Value Date   TSH 2.098 10/16/2020   Lab Results  Component Value Date   WBC 5.2 10/16/2020   HGB 12.3 10/16/2020   HCT 36.8 10/16/2020   MCV 86.4 10/16/2020   PLT 343 10/16/2020   Lab Results  Component Value Date   NA 138 10/16/2020   K 3.7 10/16/2020   CO2 22 10/16/2020   GLUCOSE 106 (H)  10/16/2020   BUN 9 10/16/2020   CREATININE 0.74 10/16/2020   BILITOT 0.4 10/16/2020   ALKPHOS 70 10/16/2020   AST 21 10/16/2020   ALT 17 10/16/2020   PROT 6.8 10/16/2020   ALBUMIN 3.5 10/16/2020   CALCIUM 8.5 (L) 10/16/2020   ANIONGAP 9 10/16/2020   Lab Results  Component Value Date   CHOL 164 10/16/2020   Lab Results  Component Value Date   HDL 28 (L) 10/16/2020   Lab Results  Component Value Date   LDLCALC 121 (H) 10/16/2020   Lab Results  Component Value Date   TRIG 77 10/16/2020   Lab Results  Component Value Date   CHOLHDL 5.9 10/16/2020   Lab Results  Component Value Date   HGBA1C 5.7 (H) 10/16/2020      Assessment & Plan:   Problem List Items Addressed This Visit      Cardiovascular and Mediastinum   Essential hypertension - Primary    BP Readings from Last 3 Encounters:  02/20/21 134/87  11/20/20 (!) 143/88  11/04/20 (!) 165/94   Noncompliant Continue Amlodipine 10 mg QD and Atenolol 25 mg QD, counseled for compliance DASH diet and moderate exercise as tolerated      Relevant Orders   CBC   Basic Metabolic Panel (BMET)   Vitamin D (25 hydroxy)     Digestive   Gastroesophageal reflux disease without esophagitis    On Pantoprazole        Endocrine   Hypothyroidism    TSH wnl Continue Levothyroxine 50 mcg QD      Relevant Orders   TSH + free T4   Vitamin D (25 hydroxy)     Other  Hyperlipidemia    On Atorvastatin Check lipid profile      Relevant Orders   Lipid panel   Prediabetes    Lab Results  Component Value Date   HGBA1C 5.7 (H) 10/16/2020   Follow low carbohydrate diet      Relevant Orders   Hemoglobin A1c    Other Visit Diagnoses    Encounter for screening colonoscopy       Relevant Orders   Ambulatory referral to Gastroenterology      No orders of the defined types were placed in this encounter.   Follow-up: Return in about 3 months (around 05/20/2021) for Annual physical.    Lindell Spar, MD

## 2021-02-20 NOTE — Assessment & Plan Note (Signed)
TSH wnl Continue Levothyroxine 50 mcg QD

## 2021-02-20 NOTE — Assessment & Plan Note (Signed)
Lab Results  Component Value Date   HGBA1C 5.7 (H) 10/16/2020   Follow low carbohydrate diet

## 2021-05-22 ENCOUNTER — Encounter: Payer: Medicaid Other | Admitting: Internal Medicine

## 2021-06-14 IMAGING — CR DG ELBOW 2V*L*
1 series · 2 of 2 positions shown · non-contrast
Comparison: None.

CLINICAL DATA: Pain and swelling, stroke, unknown injury

EXAM:
LEFT SHOULDER - 2+ VIEW; LEFT ELBOW - 2 VIEW

[Series 1: ap · 0.17mm/px · 2 of 2 slices shown]
[im 1/2]
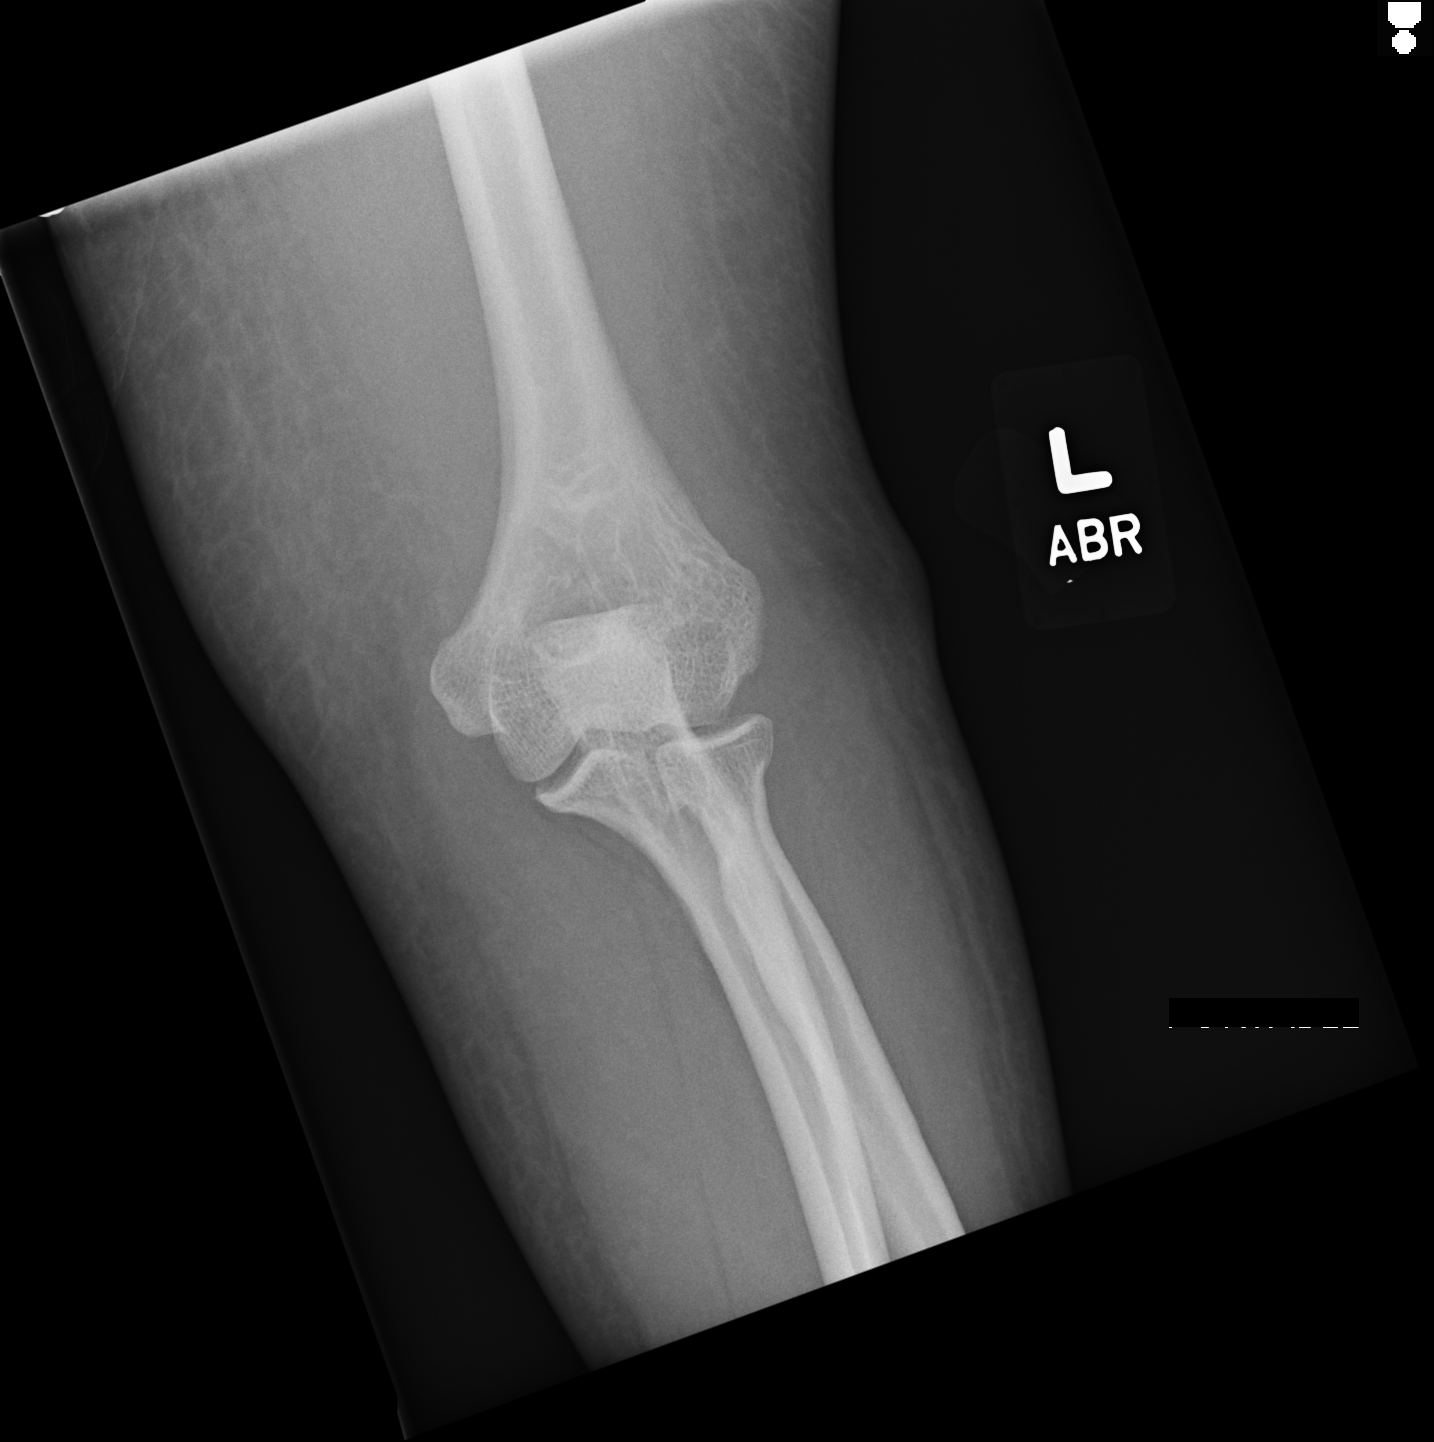
[im 2/2]
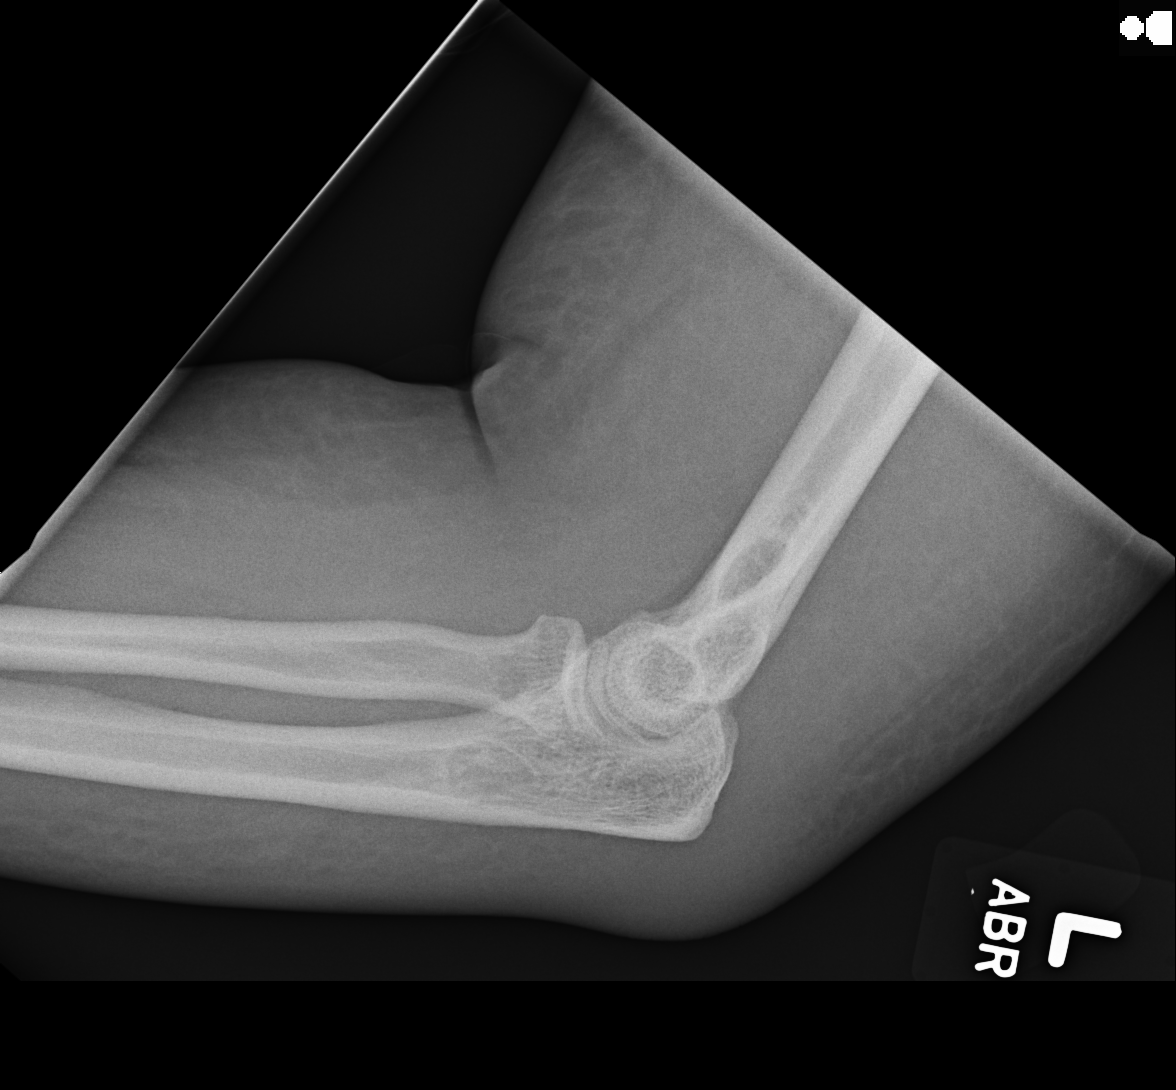

[2 of 2 positions shown; findings below may reference images not displayed]

FINDINGS: No fracture or dislocation of the left shoulder. The joint spaces
are well preserved. The partially imaged left chest is unremarkable.

No fracture or dislocation of the left elbow. Joint spaces are well
preserved. No elbow joint effusion. Diffuse soft tissue edema about
the elbow.
IMPRESSION: 1. No fracture or dislocation of the left shoulder. The joint spaces
are well preserved.

2. No fracture or dislocation of the left elbow. Joint spaces are
well preserved. No elbow joint effusion. Diffuse soft tissue edema
about the elbow.

## 2021-06-14 IMAGING — CR DG SHOULDER 2+V*L*
1 series · 2 of 2 positions shown · non-contrast
Comparison: None.

CLINICAL DATA: Pain and swelling, stroke, unknown injury

EXAM:
LEFT SHOULDER - 2+ VIEW; LEFT ELBOW - 2 VIEW

[Series 1: ap · 0.17mm/px · 2 of 2 slices shown]
[im 1/2]
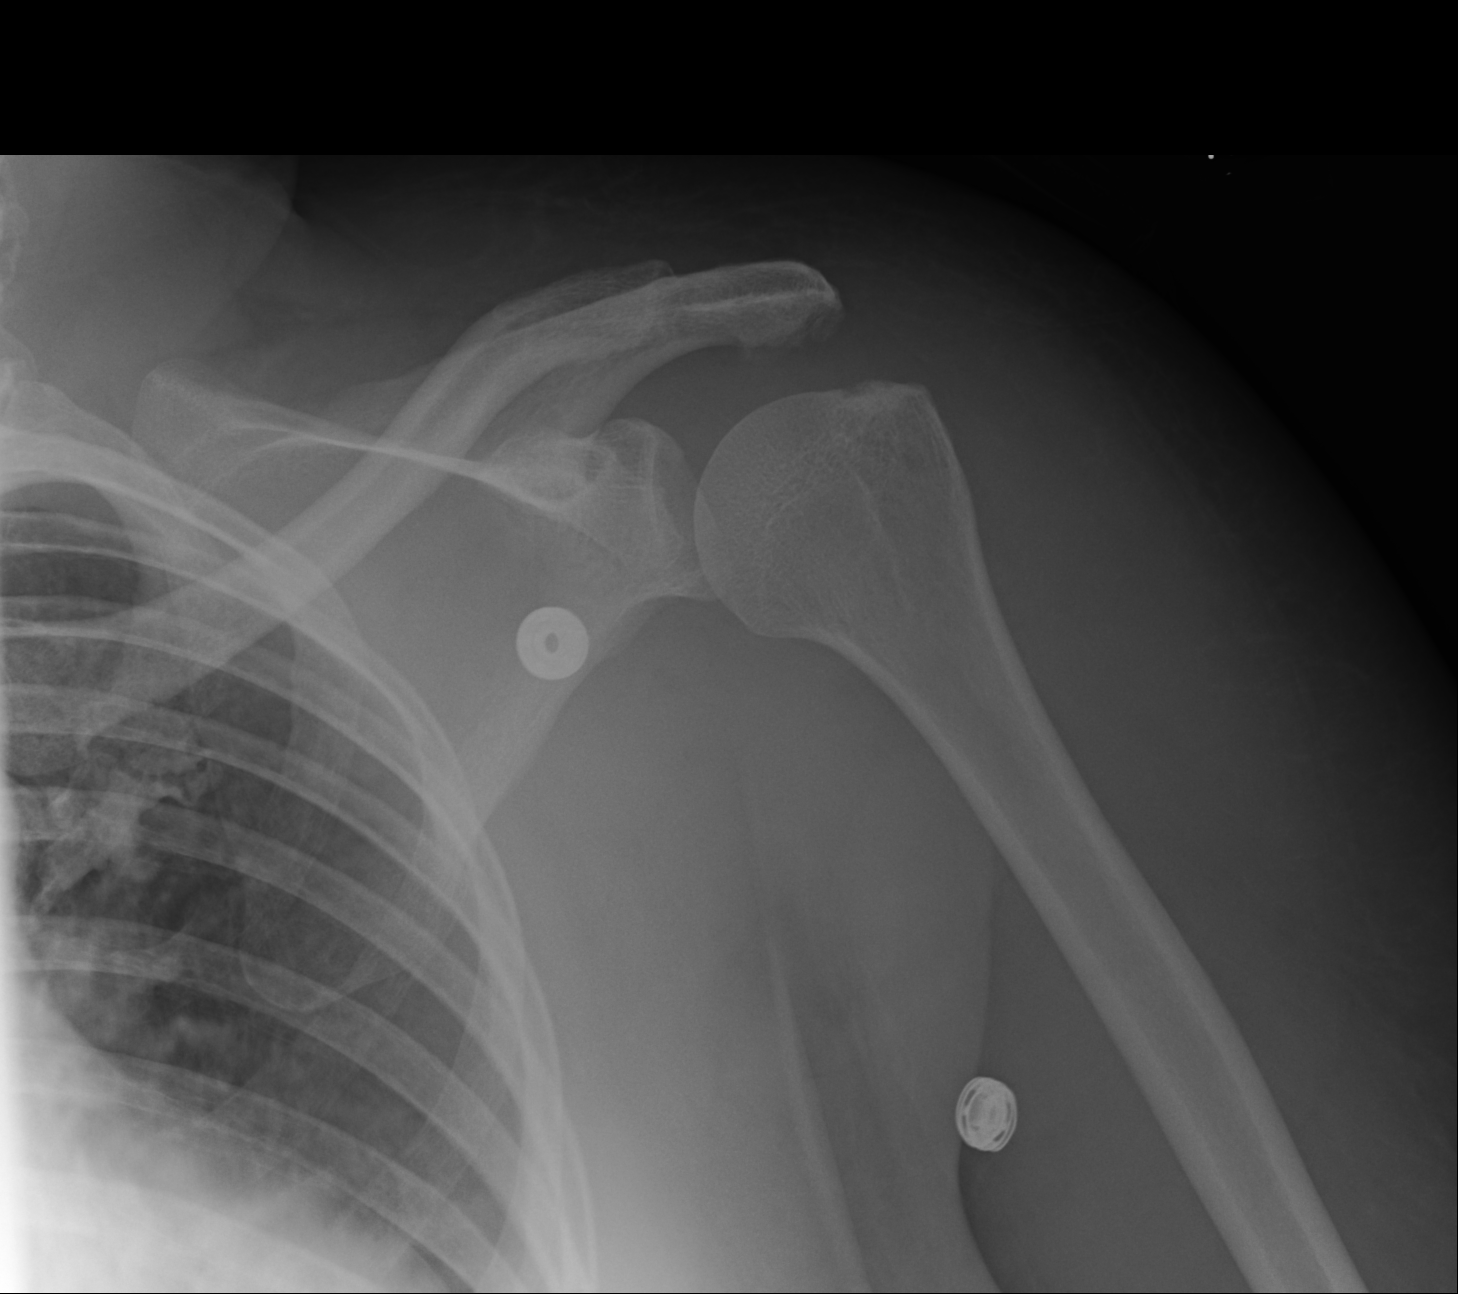
[im 2/2]
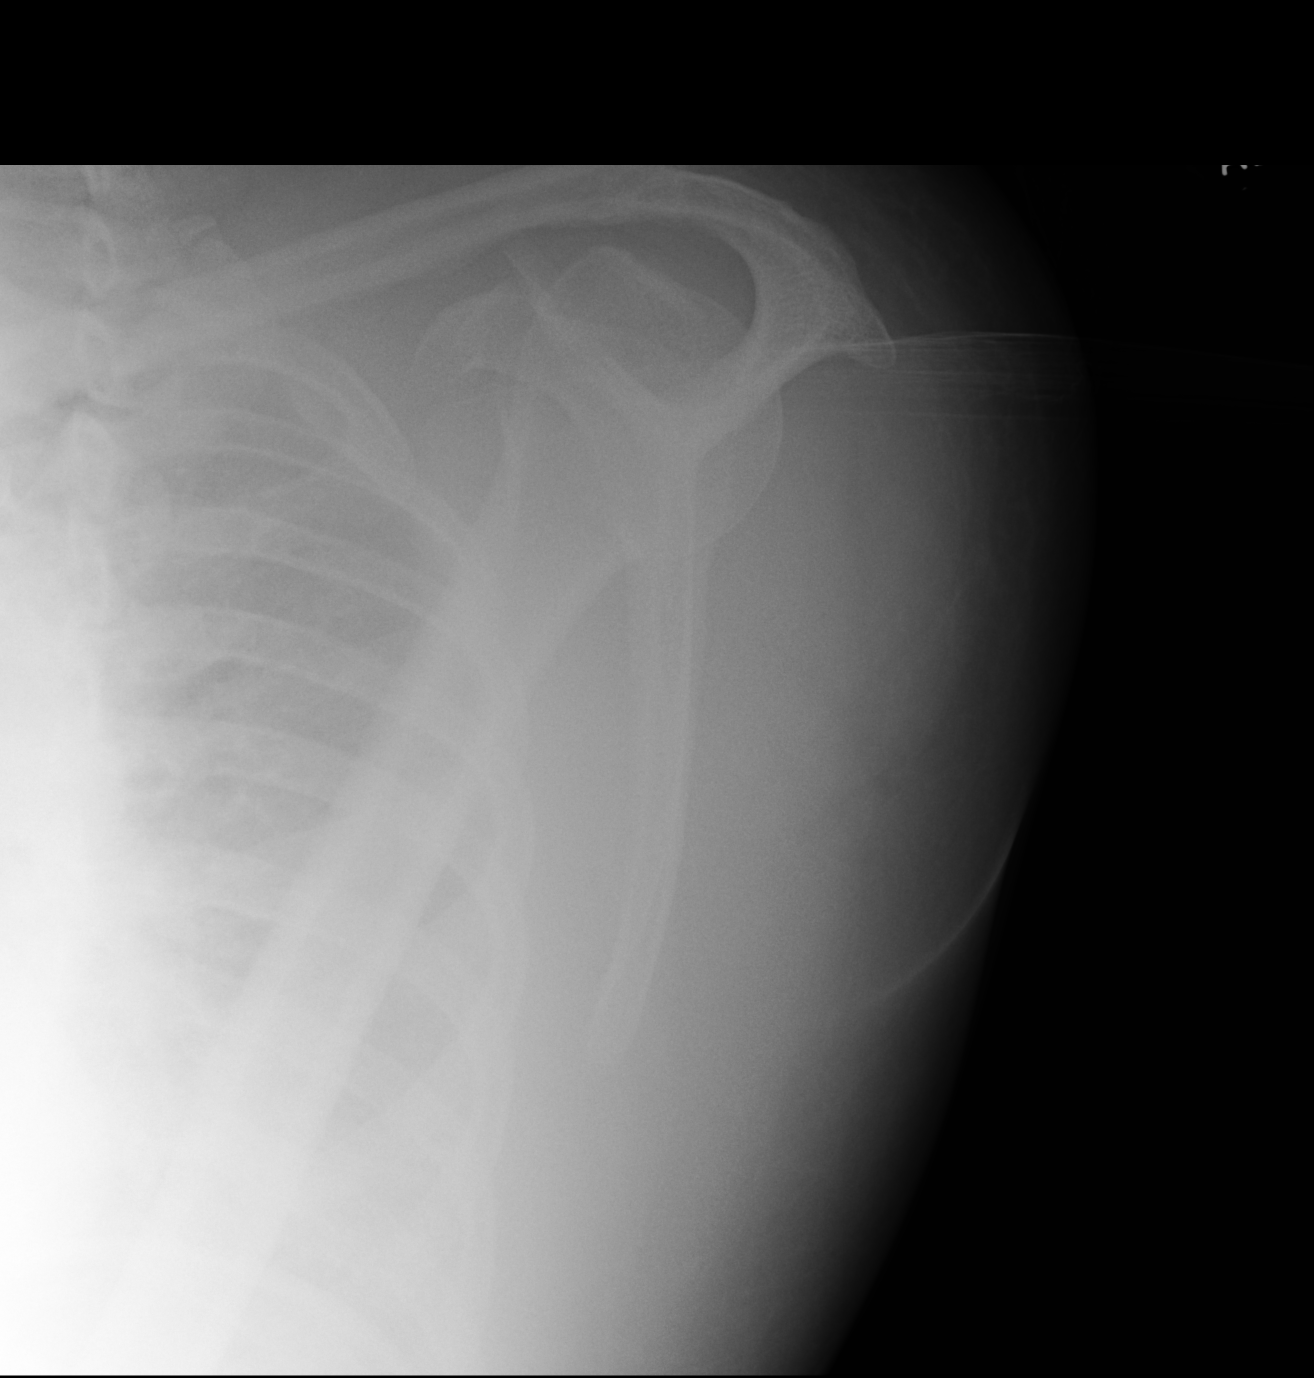

[2 of 2 positions shown; findings below may reference images not displayed]

FINDINGS: No fracture or dislocation of the left shoulder. The joint spaces
are well preserved. The partially imaged left chest is unremarkable.

No fracture or dislocation of the left elbow. Joint spaces are well
preserved. No elbow joint effusion. Diffuse soft tissue edema about
the elbow.
IMPRESSION: 1. No fracture or dislocation of the left shoulder. The joint spaces
are well preserved.

2. No fracture or dislocation of the left elbow. Joint spaces are
well preserved. No elbow joint effusion. Diffuse soft tissue edema
about the elbow.

## 2021-06-16 IMAGING — US US EXTREM  UP VENOUS*L*
1 series · 13 of 24 positions shown · non-contrast
Comparison: None.

CLINICAL DATA: Edema, pain



[Series 1: us extrem up venous*left* · 13 of 46 slices shown]
[im 1/46]
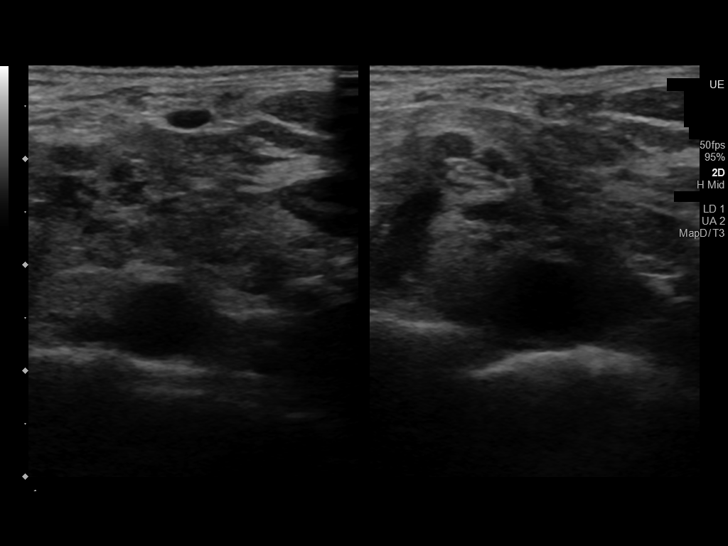
[im 4/46]
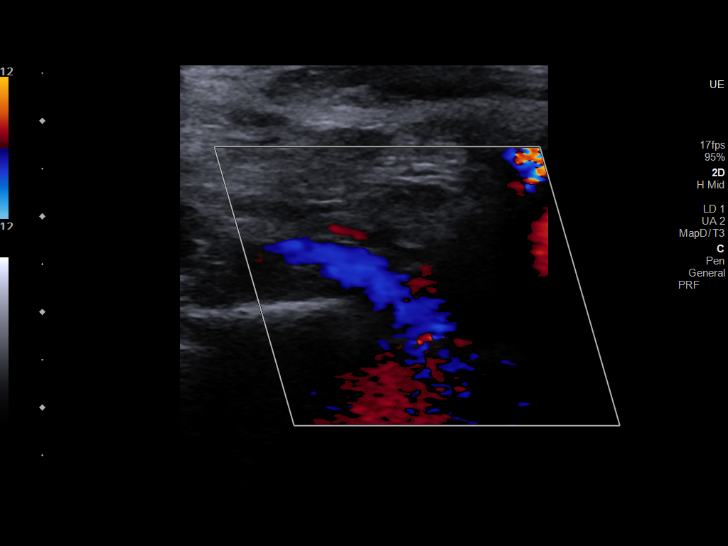
[im 8/46]
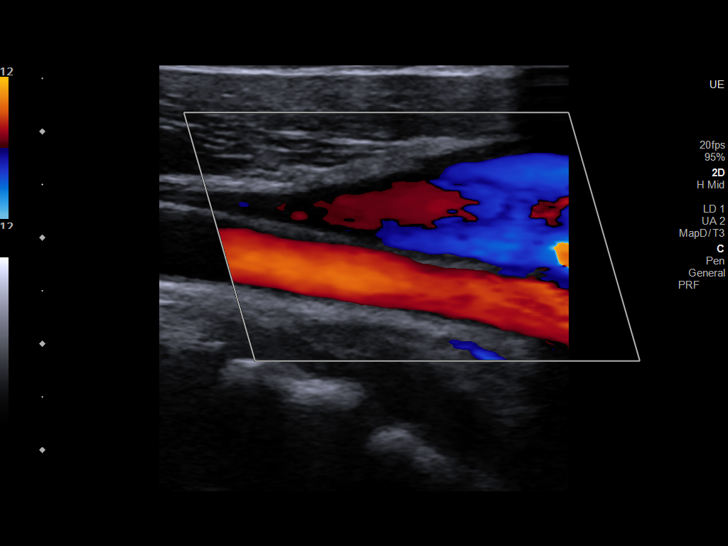
[im 12/46]
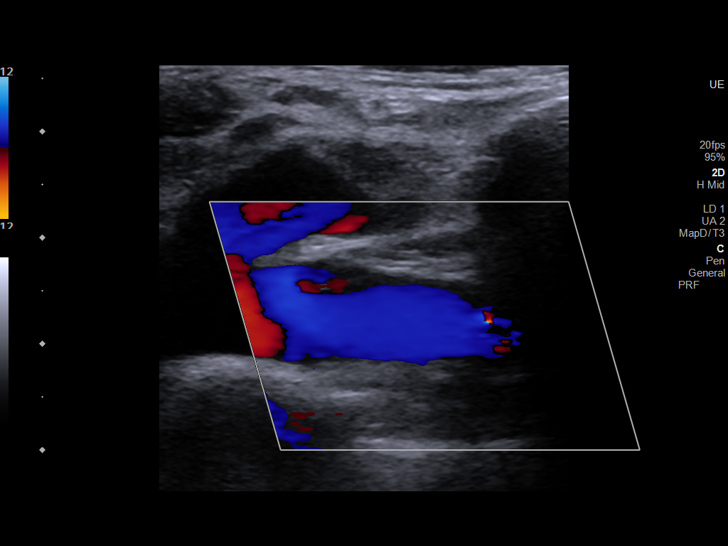
[im 16/46]
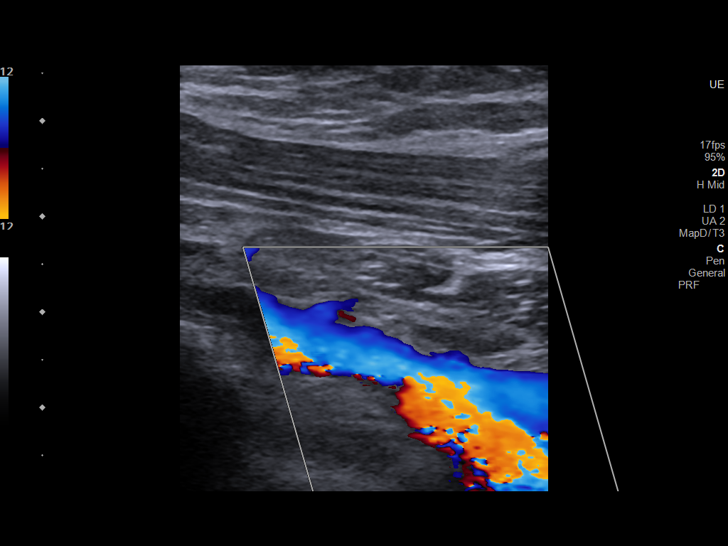
[im 20/46]
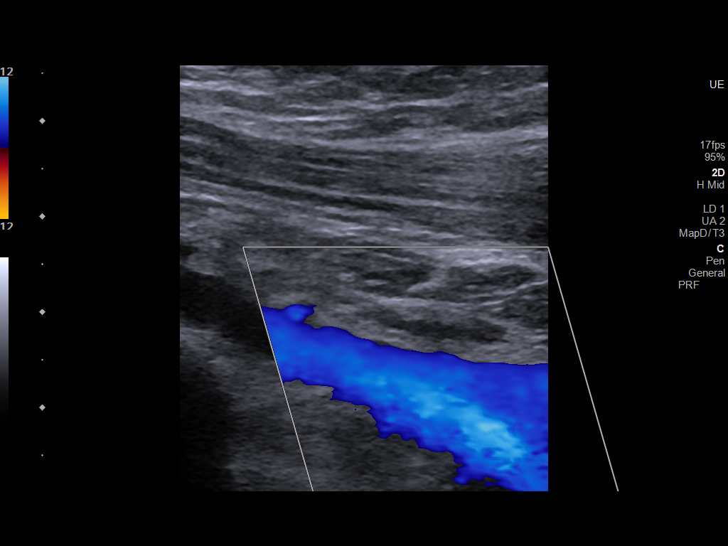
[im 24/46]
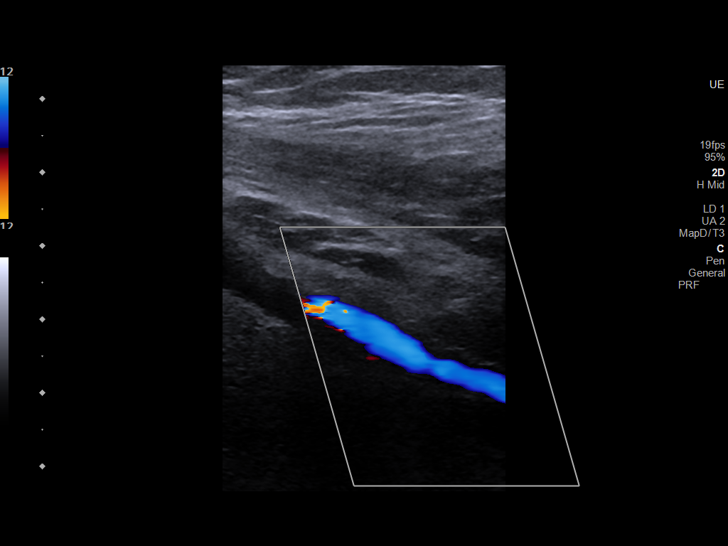
[im 26/46]
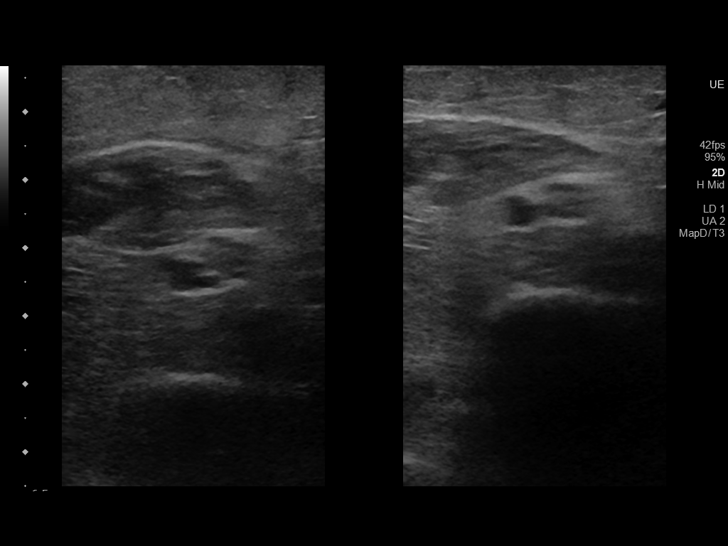
[im 30/46]
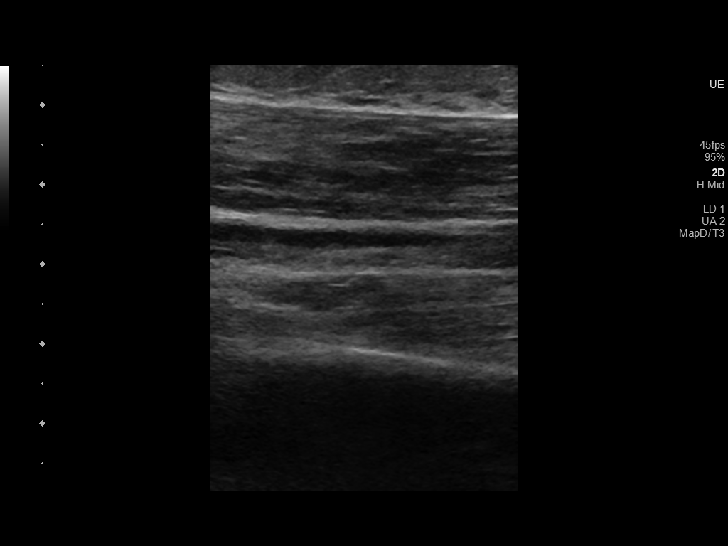
[im 34/46]
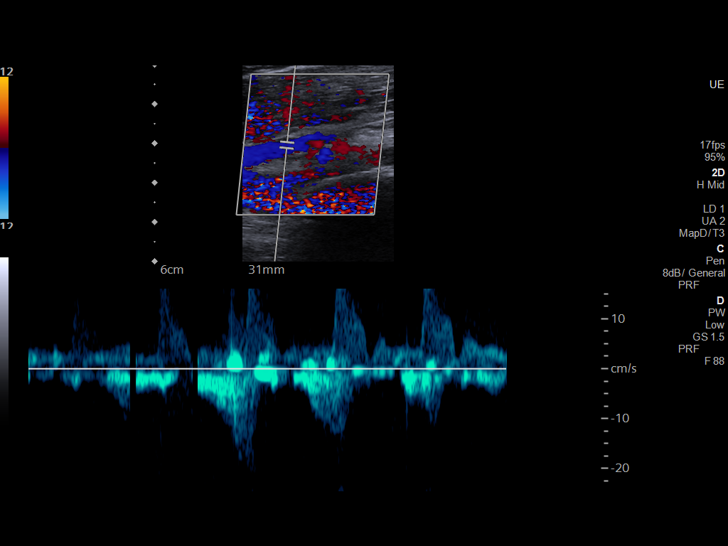
[im 38/46]
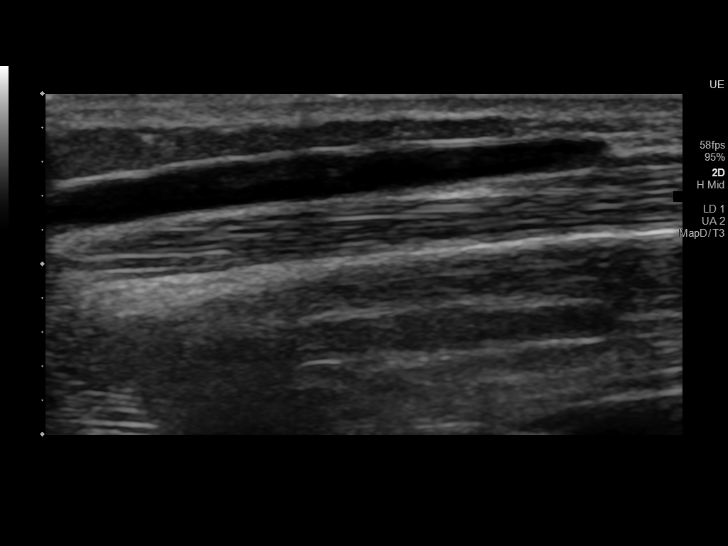
[im 42/46]
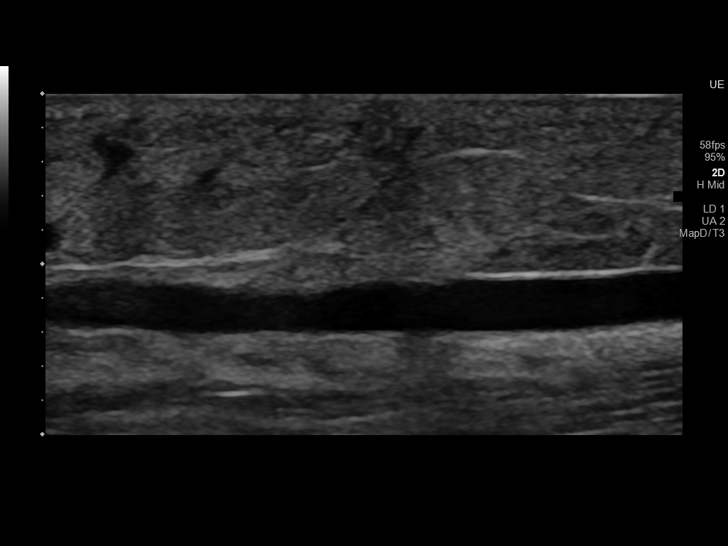
[im 46/46]
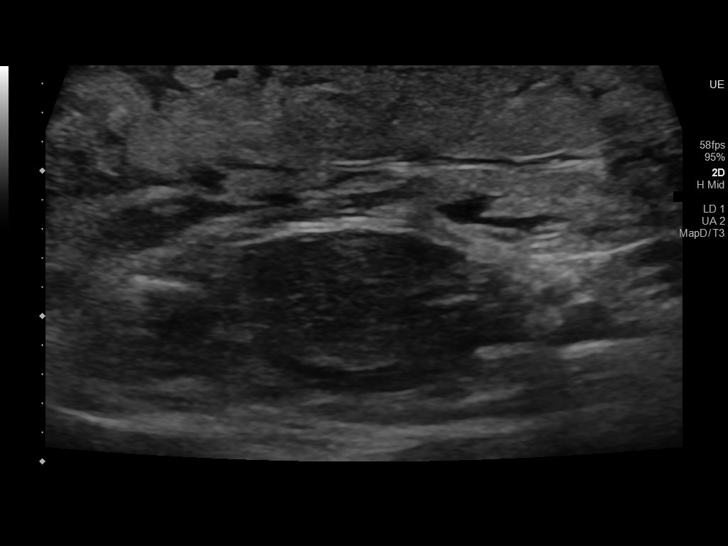

[13 of 24 positions shown; findings below may reference images not displayed]

FINDINGS: Contralateral Subclavian Vein: Respiratory phasicity is normal and
symmetric with the symptomatic side. No evidence of thrombus. Normal
compressibility.

Internal Jugular Vein: No evidence of thrombus. Normal
compressibility, respiratory phasicity and response to augmentation.

Subclavian Vein: No evidence of thrombus. Normal compressibility,
respiratory phasicity and response to augmentation.

Axillary Vein: No evidence of thrombus. Normal compressibility,
respiratory phasicity and response to augmentation.

Cephalic Vein: No evidence of thrombus. Normal compressibility,
respiratory phasicity and response to augmentation.

Basilic Vein: No evidence of thrombus. Normal compressibility,
respiratory phasicity and response to augmentation.

Brachial Veins: No evidence of thrombus. Normal compressibility,
respiratory phasicity and response to augmentation.

Radial Veins: No evidence of thrombus. Normal compressibility,
respiratory phasicity and response to augmentation.

Ulnar Veins: No evidence of thrombus. Normal compressibility,
respiratory phasicity and response to augmentation.

Venous Reflux:  None visualized.

Other Findings:  Subcutaneous edema noted in the forearm.
IMPRESSION: No evidence of DVT within the left upper extremity.

## 2021-06-20 IMAGING — XA IR FLUORO GUIDE CV LINE*R*
1 series · 1 of 1 positions shown · non-contrast
Comparison: none

INDICATION: End-stage renal disease. In need of durable intravenous access for
the initiation of dialysis.

[Series 1: single · 1 of 1 slices shown]
[im 1/1]
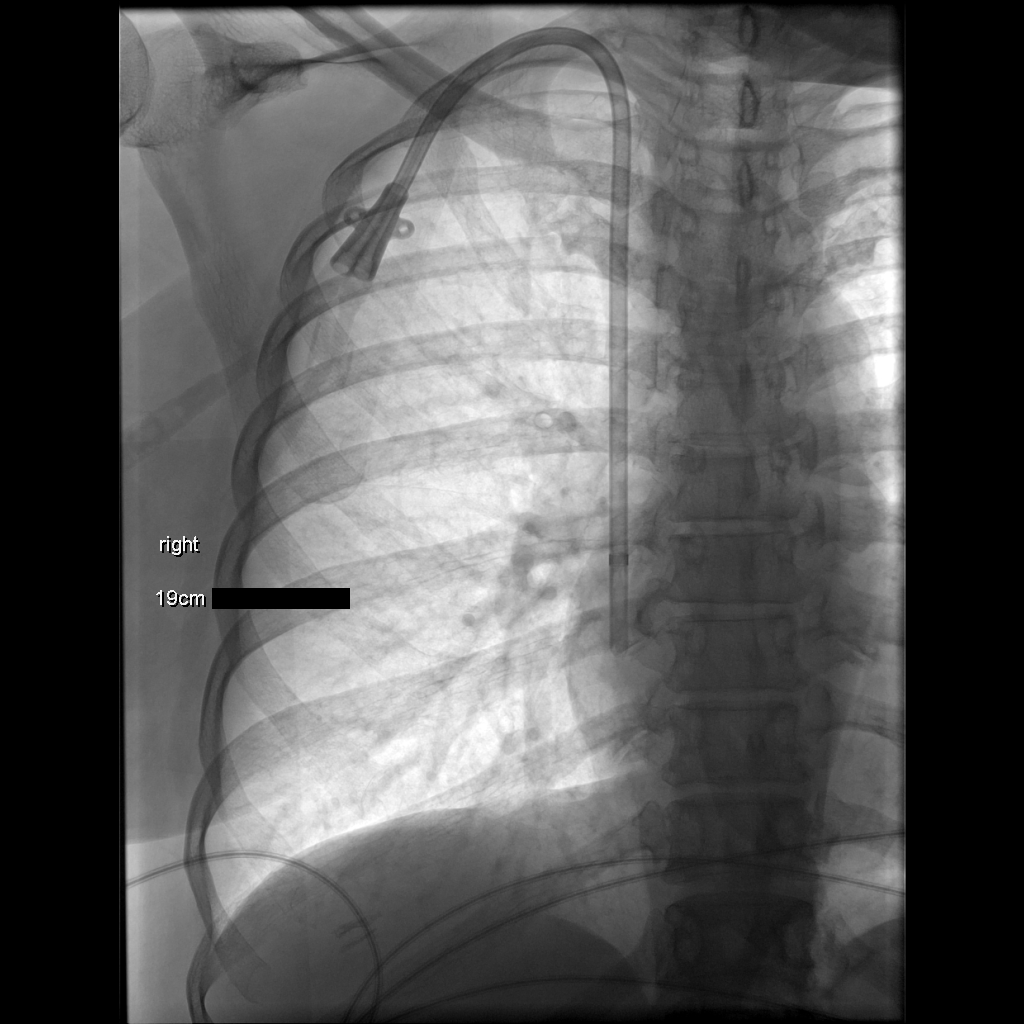

[1 of 1 positions shown; findings below may reference images not displayed]

EXAM:
TUNNELED CENTRAL VENOUS HEMODIALYSIS CATHETER PLACEMENT WITH
ULTRASOUND AND FLUOROSCOPIC GUIDANCE

MEDICATIONS:
Ancef 2 gm IV . The antibiotic was given in an appropriate time
interval prior to skin puncture.

ANESTHESIA/SEDATION:
Versed 0.5 mg IV; Fentanyl 25 mcg IV;

Moderate Sedation Time:  17 minutes

The patient was continuously monitored during the procedure by the
interventional radiology nurse under my direct supervision.

FLUOROSCOPY TIME:  54 seconds (6 mGy)

COMPLICATIONS:
None immediate.



After creating a small venotomy incision, a micropuncture kit was
utilized to access the internal jugular vein. Real-time ultrasound
guidance was utilized for vascular access including the acquisition
of a permanent ultrasound image documenting patency of the accessed
vessel. The microwire was utilized to measure appropriate catheter
length.

A stiff Glidewire was advanced to the level of the IVC and the
micropuncture sheath was exchanged for a peel-away sheath. A
palindrome tunneled hemodialysis catheter measuring 19 cm from tip
to cuff was tunneled in a retrograde fashion from the anterior chest
wall to the venotomy incision.

The catheter was then placed through the peel-away sheath with tips
ultimately positioned within the superior aspect of the right
atrium. Final catheter positioning was confirmed and documented with
a spot radiographic image. The catheter aspirates and flushes
normally. The catheter was flushed with appropriate volume heparin
dwells.

The catheter exit site was secured with a 0-Prolene retention
suture. The venotomy incision was closed with an interrupted 4-0
Vicryl, Dermabond and Polo. Dressings were applied. The
patient tolerated the procedure well without immediate post
procedural complication.
IMPRESSION: Successful placement of 19 cm tip to cuff tunneled hemodialysis
catheter via the right internal jugular vein with tips terminating
within the superior aspect of the right atrium. The catheter is
ready for immediate use.

## 2023-12-23 NOTE — Progress Notes (Signed)
New Patient Office Visit   Subjective   Patient ID: Alicia Flowers, female    DOB: Dec 13, 1971  Age: 52 y.o. MRN: [id number]  CC:  Chief Complaint  Patient presents with   Establish Care    New patient wants to discuss menopause.    HPI Alicia Flowers 52 year old female, presents to establish care. She  has a past medical history of Anxiety, CAP (community acquired pneumonia) (09/14/2019), Depression, GERD (gastroesophageal reflux disease), Hyperlipidemia, Hypertension (09/04/2019), Stroke (HCC) (09/04/2019), Substance abuse (HCC), Tennis elbow, and Thyroid disease.  Patient presents for an initial evaluation of anxiety, with symptoms first noted 1 to 2 years ago. The condition has been waxing and waning over time. Reported symptoms include decreased concentration, excessive worry, irritability, nervousness, restlessness, and poor sleep quality, averaging 4 hours per night with occasional awakenings. Symptoms occur occasionally but cause significant distress, particularly aggravated by family-related issues.Patient denies chest pain, shortness of breath, or suicidal ideation. There are no identified risk factors. Her medical history is notable for anxiety and panic attacks. She has not undergone any prior treatments for anxiety.       Outpatient Encounter Medications as of 12/24/2023  Medication Sig   escitalopram (LEXAPRO) 10 MG tablet Take 1 tablet (10 mg total) by mouth daily.   gabapentin (NEURONTIN) 300 MG capsule Take 1 capsule (300 mg total) by mouth 2 (two) times daily.   [DISCONTINUED] amLODipine (NORVASC) 10 MG tablet Take 1 tablet (10 mg total) by mouth daily.   [DISCONTINUED] aspirin 81 MG EC tablet Take 1 tablet (81 mg total) by mouth daily.   [DISCONTINUED] atenolol (TENORMIN) 25 MG tablet Take 1 tablet (25 mg total) by mouth daily.   [DISCONTINUED] atorvastatin (LIPITOR) 20 MG tablet Take 1 tablet (20 mg total) by mouth daily at 6 PM.   [DISCONTINUED] docusate sodium  (COLACE) 100 MG capsule Take 100 mg by mouth daily as needed for mild constipation.    [DISCONTINUED] folic acid (FOLVITE) 1 MG tablet Take 1 tablet (1 mg total) by mouth daily.   [DISCONTINUED] levothyroxine (SYNTHROID) 50 MCG tablet Take 1 tablet (50 mcg total) by mouth daily at 6 (six) AM.   [DISCONTINUED] pantoprazole (PROTONIX) 40 MG tablet Take 1 tablet (40 mg total) by mouth daily.   No facility-administered encounter medications on file as of 12/24/2023.    Past Surgical History:  Procedure Laterality Date   APPENDECTOMY     CENTRAL VENOUS CATHETER INSERTION N/A 09/15/2019   Procedure: MINOR INSERTION CENTRAL LINE ADULT;  Surgeon: Franky Macho, MD;  Location: [facility];  Service: General;  Laterality: N/A;   CESAREAN SECTION     IR FLUORO GUIDE CV LINE RIGHT  09/21/2019   IR US GUIDE VASC ACCESS RIGHT  09/21/2019   TUBAL LIGATION      Review of Systems  Constitutional:  Negative for chills and fever.  Eyes:  Negative for blurred vision.  Respiratory:  Negative for shortness of breath.   Cardiovascular:  Negative for chest pain.  Gastrointestinal:  Negative for abdominal pain.  Genitourinary:  Negative for dysuria.  Neurological:  Positive for headaches.  Psychiatric/Behavioral:  The patient is nervous/anxious and has insomnia.       Objective    BP (!) 142/86 (BP Location: Left Arm)   Pulse 80   Ht 5\' 3"  (1.6 m)   Wt 180 lb 0.6 oz (81.7 kg)   LMP 12/20/2023   SpO2 98%   BMI 31.89 kg/m   Physical Exam  Vitals reviewed.  Constitutional:      General: She is not in acute distress.    Appearance: Normal appearance. She is not ill-appearing, toxic-appearing or diaphoretic.  HENT:     Head: Normocephalic.     Right Ear: Tympanic membrane normal.     Left Ear: Tympanic membrane normal.     Mouth/Throat:     Mouth: Mucous membranes are moist.     Pharynx: No oropharyngeal exudate.  Eyes:     General:        Right eye: No discharge.        Left eye: No discharge.      Conjunctiva/sclera: Conjunctivae normal.     Pupils: Pupils are equal, round, and reactive to light.  Cardiovascular:     Rate and Rhythm: Normal rate.     Pulses: Normal pulses.     Heart sounds: Normal heart sounds.  Pulmonary:     Effort: Pulmonary effort is normal. No respiratory distress.     Breath sounds: Normal breath sounds.  Abdominal:     General: Bowel sounds are normal.     Palpations: Abdomen is soft.     Tenderness: There is no abdominal tenderness. There is no right CVA tenderness, left CVA tenderness or guarding.  Musculoskeletal:        General: Normal range of motion.     Cervical back: Normal range of motion.  Skin:    General: Skin is warm and dry.     Capillary Refill: Capillary refill takes less than 2 seconds.  Neurological:     Mental Status: She is alert.  Psychiatric:        Mood and Affect: Mood normal.       Assessment & Plan:  Primary hypertension -     Microalbumin / creatinine urine ratio -     Lipid panel -     CMP14+EGFR -     CBC with Differential/Platelet  Encounter for screening mammogram for malignant neoplasm of breast -     3D Screening Mammogram, Left and Right; Future  Screening for colon cancer -     Ambulatory referral to Gastroenterology  TSH (thyroid-stimulating hormone deficiency) -     TSH + free T4  Vitamin D deficiency -     VITAMIN D 25 Hydroxy (Vit-D Deficiency, Fractures)  IFG (impaired fasting glucose) -     Hemoglobin A1c  Iron deficiency anemia, unspecified iron deficiency anemia type -     Iron, TIBC and Ferritin Panel -     Vitamin B12  Menopausal syndrome (hot flashes) -     Ambulatory referral to Obstetrics / Gynecology  Anxiety Assessment & Plan: Trial on Lexapro 10 mg once daily Gabapentin 300 mg PRN at bedtime We discussed several non-pharmacological approaches to managing anxiety including:  Establishing a consistent daily routine: This helps create structure and stability. Practicing  mindfulness and relaxation techniques: Incorporating meditation, deep breathing exercises, or yoga to manage stress and improve emotional well-being. Engaging in regular physical activity: Aim for at least 30 minutes of exercise most days to boost mood and energy levels. Spending time outdoors: Exposure to natural light and fresh air can improve mental health. Building a support network: Encouraging social connections with friends, family, or support groups to reduce feelings of isolation. Prioritizing a balanced diet: Eating nutrient-rich foods while avoiding excessive amounts of processed foods, sugar, and unhealthy fats. Follow-up is recommended in 4-8 weeks to assess progress, with a referral to behavioral health for further  support if needed.    Orders: -     Ambulatory referral to Psychiatry  Essential hypertension Assessment & Plan: Vitals:   12/24/23 1421 12/24/23 1424  BP: (!) 148/89 (!) 142/86  Initial visit Follow up in 2 weeks to recheck blood pressure Labs ordered. Discussed with  patient to monitor their blood pressure regularly and maintain a heart-healthy diet rich in fruits, vegetables, whole grains, and low-fat dairy, while reducing sodium intake to less than 2,300 mg per day. Regular physical activity, such as 30 minutes of moderate exercise most days of the week, will help lower blood pressure and improve overall cardiovascular health. Avoiding smoking, limiting alcohol consumption, and managing stress. Take  prescribed medication, & take it as directed and avoid skipping doses. Seek emergency care if your blood pressure is (over 180/100) or you experience chest pain, shortness of breath, or sudden vision changes.Patient verbalizes understanding regarding plan of care and all questions answered.    Other orders -     Escitalopram Oxalate; Take 1 tablet (10 mg total) by mouth daily.  Dispense: 30 tablet; Refill: 3 -     Gabapentin; Take 1 capsule (300 mg total) by mouth 2  (two) times daily.  Dispense: 60 capsule; Refill: 3    Return in about 10 weeks (around 03/03/2024), or if symptoms worsen or fail to improve, for medication managment, Anxiety , 2 weeks nurse visit for blood pressure readings.   Cruzita Lederer Newman Nip, FNP

## 2023-12-23 NOTE — Patient Instructions (Signed)

## 2023-12-24 ENCOUNTER — Ambulatory Visit (INDEPENDENT_AMBULATORY_CARE_PROVIDER_SITE_OTHER): Payer: 59 | Admitting: Family Medicine

## 2023-12-24 ENCOUNTER — Encounter: Payer: Self-pay | Admitting: Family Medicine

## 2023-12-24 VITALS — BP 142/86 | HR 80 | Ht 63.0 in | Wt 180.0 lb

## 2023-12-24 DIAGNOSIS — R7301 Impaired fasting glucose: Secondary | ICD-10-CM

## 2023-12-24 DIAGNOSIS — D509 Iron deficiency anemia, unspecified: Secondary | ICD-10-CM

## 2023-12-24 DIAGNOSIS — E038 Other specified hypothyroidism: Secondary | ICD-10-CM

## 2023-12-24 DIAGNOSIS — I10 Essential (primary) hypertension: Secondary | ICD-10-CM | POA: Diagnosis not present

## 2023-12-24 DIAGNOSIS — N951 Menopausal and female climacteric states: Secondary | ICD-10-CM | POA: Diagnosis not present

## 2023-12-24 DIAGNOSIS — E559 Vitamin D deficiency, unspecified: Secondary | ICD-10-CM

## 2023-12-24 DIAGNOSIS — F419 Anxiety disorder, unspecified: Secondary | ICD-10-CM | POA: Diagnosis not present

## 2023-12-24 DIAGNOSIS — Z1231 Encounter for screening mammogram for malignant neoplasm of breast: Secondary | ICD-10-CM

## 2023-12-24 DIAGNOSIS — Z1211 Encounter for screening for malignant neoplasm of colon: Secondary | ICD-10-CM

## 2023-12-24 MED ORDER — GABAPENTIN 300 MG PO CAPS: 300.0000 mg | ORAL_CAPSULE | Freq: Two times a day (BID) | ORAL | 3 refills | Status: DC

## 2023-12-24 MED ORDER — ESCITALOPRAM OXALATE 10 MG PO TABS: 10.0000 mg | ORAL_TABLET | Freq: Every day | ORAL | 3 refills | Status: AC

## 2023-12-24 NOTE — Assessment & Plan Note (Signed)
Trial on Lexapro 10 mg once daily Gabapentin 300 mg PRN at bedtime We discussed several non-pharmacological approaches to managing anxiety including:  Establishing a consistent daily routine: This helps create structure and stability. Practicing mindfulness and relaxation techniques: Incorporating meditation, deep breathing exercises, or yoga to manage stress and improve emotional well-being. Engaging in regular physical activity: Aim for at least 30 minutes of exercise most days to boost mood and energy levels. Spending time outdoors: Exposure to natural light and fresh air can improve mental health. Building a support network: Encouraging social connections with friends, family, or support groups to reduce feelings of isolation. Prioritizing a balanced diet: Eating nutrient-rich foods while avoiding excessive amounts of processed foods, sugar, and unhealthy fats. Follow-up is recommended in 4-8 weeks to assess progress, with a referral to behavioral health for further support if needed.

## 2023-12-24 NOTE — Assessment & Plan Note (Addendum)
Vitals:   12/24/23 1421 12/24/23 1424  BP: (!) 148/89 (!) 142/86  Initial visit Follow up in 2 weeks to recheck blood pressure Labs ordered. Discussed with  patient to monitor their blood pressure regularly and maintain a heart-healthy diet rich in fruits, vegetables, whole grains, and low-fat dairy, while reducing sodium intake to less than 2,300 mg per day. Regular physical activity, such as 30 minutes of moderate exercise most days of the week, will help lower blood pressure and improve overall cardiovascular health. Avoiding smoking, limiting alcohol consumption, and managing stress. Take  prescribed medication, & take it as directed and avoid skipping doses. Seek emergency care if your blood pressure is (over 180/100) or you experience chest pain, shortness of breath, or sudden vision changes.Patient verbalizes understanding regarding plan of care and all questions answered.

## 2023-12-26 ENCOUNTER — Other Ambulatory Visit: Payer: Self-pay | Admitting: Family Medicine

## 2023-12-26 LAB — CBC WITH DIFFERENTIAL/PLATELET
Basophils Absolute: 0.1 10*3/uL (ref 0.0–0.2)
Basos: 1 %
EOS (ABSOLUTE): 0.2 10*3/uL (ref 0.0–0.4)
Eos: 3 %
Hematocrit: 40.1 % (ref 34.0–46.6)
Hemoglobin: 13.1 g/dL (ref 11.1–15.9)
Immature Grans (Abs): 0 10*3/uL (ref 0.0–0.1)
Immature Granulocytes: 0 %
Lymphocytes Absolute: 2.8 10*3/uL (ref 0.7–3.1)
Lymphs: 42 %
MCH: 29.8 pg (ref 26.6–33.0)
MCHC: 32.7 g/dL (ref 31.5–35.7)
MCV: 91 fL (ref 79–97)
Monocytes Absolute: 0.8 10*3/uL (ref 0.1–0.9)
Monocytes: 13 %
Neutrophils Absolute: 2.7 10*3/uL (ref 1.4–7.0)
Neutrophils: 41 %
Platelets: 304 10*3/uL (ref 150–450)
RBC: 4.4 x10E6/uL (ref 3.77–5.28)
RDW: 14.2 % (ref 11.7–15.4)
WBC: 6.6 10*3/uL (ref 3.4–10.8)

## 2023-12-26 LAB — CMP14+EGFR
ALT: 21 [IU]/L (ref 0–32)
AST: 22 [IU]/L (ref 0–40)
Albumin: 4.1 g/dL (ref 3.8–4.9)
Alkaline Phosphatase: 96 [IU]/L (ref 44–121)
BUN/Creatinine Ratio: 13 (ref 9–23)
BUN: 11 mg/dL (ref 6–24)
Bilirubin Total: 0.2 mg/dL (ref 0.0–1.2)
CO2: 21 mmol/L (ref 20–29)
Calcium: 9 mg/dL (ref 8.7–10.2)
Chloride: 107 mmol/L — ABNORMAL HIGH (ref 96–106)
Creatinine, Ser: 0.84 mg/dL (ref 0.57–1.00)
Globulin, Total: 2.8 g/dL (ref 1.5–4.5)
Glucose: 87 mg/dL (ref 70–99)
Potassium: 4.4 mmol/L (ref 3.5–5.2)
Sodium: 140 mmol/L (ref 134–144)
Total Protein: 6.9 g/dL (ref 6.0–8.5)
eGFR: 84 mL/min/{1.73_m2} (ref >59)

## 2023-12-26 LAB — LIPID PANEL
Chol/HDL Ratio: 5.7 {ratio} — ABNORMAL HIGH (ref 0.0–4.4)
Cholesterol, Total: 172 mg/dL (ref 100–199)
HDL: 30 mg/dL — ABNORMAL LOW (ref >39)
LDL Chol Calc (NIH): 123 mg/dL — ABNORMAL HIGH (ref 0–99)
Triglycerides: 103 mg/dL (ref 0–149)
VLDL Cholesterol Cal: 19 mg/dL (ref 5–40)

## 2023-12-26 LAB — IRON,TIBC AND FERRITIN PANEL
Ferritin: 39 ng/mL (ref 15–150)
Iron Saturation: 14 % — ABNORMAL LOW (ref 15–55)
Iron: 40 ug/dL (ref 27–159)
Total Iron Binding Capacity: 278 ug/dL (ref 250–450)
UIBC: 238 ug/dL (ref 131–425)

## 2023-12-26 LAB — TSH+FREE T4
Free T4: 1.27 ng/dL (ref 0.82–1.77)
TSH: 2.16 u[IU]/mL (ref 0.450–4.500)

## 2023-12-26 LAB — HEMOGLOBIN A1C
Est. average glucose Bld gHb Est-mCnc: 111 mg/dL
Hgb A1c MFr Bld: 5.5 % (ref 4.8–5.6)

## 2023-12-26 LAB — VITAMIN B12: Vitamin B-12: 432 pg/mL (ref 232–1245)

## 2023-12-26 LAB — VITAMIN D 25 HYDROXY (VIT D DEFICIENCY, FRACTURES): Vit D, 25-Hydroxy: 14.1 ng/mL — ABNORMAL LOW (ref 30.0–100.0)

## 2023-12-26 LAB — MICROALBUMIN / CREATININE URINE RATIO
Creatinine, Urine: 314.3 mg/dL
Microalb/Creat Ratio: 9 mg/g{creat} (ref 0–29)
Microalbumin, Urine: 28.7 ug/mL

## 2023-12-26 MED ORDER — ROSUVASTATIN CALCIUM 20 MG PO TABS: 20.0000 mg | ORAL_TABLET | Freq: Every day | ORAL | 3 refills | Status: AC

## 2023-12-27 ENCOUNTER — Encounter (INDEPENDENT_AMBULATORY_CARE_PROVIDER_SITE_OTHER): Payer: Self-pay | Admitting: *Deleted

## 2024-01-11 ENCOUNTER — Encounter: Payer: Self-pay | Admitting: Obstetrics & Gynecology

## 2024-01-11 ENCOUNTER — Ambulatory Visit (INDEPENDENT_AMBULATORY_CARE_PROVIDER_SITE_OTHER): Payer: 59 | Admitting: Obstetrics & Gynecology

## 2024-01-11 ENCOUNTER — Other Ambulatory Visit (HOSPITAL_COMMUNITY)
Admission: RE | Admit: 2024-01-11 | Discharge: 2024-01-11 | Disposition: A | Discharge status: Home / Self Care | Payer: 59 | Source: Ambulatory Visit | Attending: Obstetrics & Gynecology | Admitting: Obstetrics & Gynecology

## 2024-01-11 VITALS — BP 159/98 | HR 70 | Ht 63.0 in | Wt 176.0 lb

## 2024-01-11 DIAGNOSIS — Z01419 Encounter for gynecological examination (general) (routine) without abnormal findings: Secondary | ICD-10-CM | POA: Diagnosis not present

## 2024-01-11 DIAGNOSIS — N951 Menopausal and female climacteric states: Secondary | ICD-10-CM

## 2024-01-11 DIAGNOSIS — Z1231 Encounter for screening mammogram for malignant neoplasm of breast: Secondary | ICD-10-CM

## 2024-01-11 MED ORDER — TRAZODONE HCL 50 MG PO TABS: 50.0000 mg | ORAL_TABLET | Freq: Every day | ORAL | 5 refills | Status: DC

## 2024-01-11 MED ORDER — MEDROXYPROGESTERONE ACETATE 10 MG PO TABS: 10.0000 mg | ORAL_TABLET | Freq: Every day | ORAL | 3 refills | Status: AC

## 2024-01-11 NOTE — Progress Notes (Signed)
 Subjective:     Alicia Flowers is a 53 y.o. female here for a routine exam.  Patient's last menstrual period was 12/20/2023. G2P2 Birth Control Method:  BTL Menstrual Calendar(currently): irregular  Current complaints: vasomotor symptoms.   Current acute medical issues:     Recent Gynecologic History Patient's last menstrual period was 12/20/2023. Last Pap: years ,   Last mammogram: years,    Past Medical History:  Diagnosis Date   Anxiety    CAP (community acquired pneumonia) 09/14/2019   Depression    GERD (gastroesophageal reflux disease)    Hyperlipidemia    Hypertension 09/04/2019   Stroke (HCC) 09/04/2019   Substance abuse (HCC)    Tennis elbow    Thyroid  disease     Past Surgical History:  Procedure Laterality Date   APPENDECTOMY     CENTRAL VENOUS CATHETER INSERTION N/A 09/15/2019   Procedure: MINOR INSERTION CENTRAL LINE ADULT;  Surgeon: Mavis Anes, MD;  Location: [facility];  Service: General;  Laterality: N/A;   CESAREAN SECTION     IR FLUORO GUIDE CV LINE RIGHT  09/21/2019   IR US  GUIDE VASC ACCESS RIGHT  09/21/2019   TUBAL LIGATION      OB History     Gravida  2   Para  2   Term      Preterm      AB      Living  2      SAB      IAB      Ectopic      Multiple      Live Births              Social History   Socioeconomic History   Marital status: Single    Spouse name: Not on file   Number of children: 2   Years of education: two years college   Highest education level: Not on file  Occupational History   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   Right-handed.   1 cup caffeine per day.   Social Drivers of Health   Financial Resource Strain: Medium Risk (11/04/2020)   Overall Financial Resource Strain (CARDIA)    Difficulty of Paying Living Expenses: Somewhat hard  Food Insecurity: Food Insecurity Present (11/04/2020)   Hunger Vital Sign    Worried About Running Out of Food in the Last Year: Sometimes true    Ran Out of Food in the Last Year: Never true  Transportation Needs: No Transportation Needs (11/04/2020)   PRAPARE - Administrator, Civil Service (Medical): No    Lack of Transportation (Non-Medical): No  Physical Activity: Sufficiently Active (11/04/2020)   Exercise Vital Sign    Days of Exercise per Week: 6 days  Minutes of Exercise per Session: 30 min  Stress: Stress Concern Present (11/04/2020)   [employer] of Occupational Health - Occupational Stress Questionnaire    Feeling of Stress : To some extent  Social Connections: Moderately Integrated (11/04/2020)   Social Connection and Isolation Panel [NHANES]    Frequency of Communication with Friends and Family: Twice a week    Frequency of Social Gatherings with Friends and Family: Once a week    Attends Religious Services: More than 4 times per year    Active Member of [employer] or Organizations: Yes    Attends Engineer, Structural: More than 4 times per year    Marital Status: Never married    Family History  Problem Relation Age of Onset   Hypertension Mother    Cancer Mother        breast cancer   Healthy Mother    Hypertension Father    Hypertension Maternal Grandmother    Hypertension Maternal Grandfather    Healthy Sister    Healthy Brother    Hyperlipidemia Brother    Hypertension Brother      Current Outpatient  Medications:    escitalopram  (LEXAPRO ) 10 MG tablet, Take 1 tablet (10 mg total) by mouth daily., Disp: 30 tablet, Rfl: 3   medroxyPROGESTERone  (PROVERA ) 10 MG tablet, Take 1 tablet (10 mg total) by mouth daily. First 10 days of each month, Disp: 30 tablet, Rfl: 3   rosuvastatin  (CRESTOR ) 20 MG tablet, Take 1 tablet (20 mg total) by mouth daily., Disp: 90 tablet, Rfl: 3   traZODone  (DESYREL ) 50 MG tablet, Take 1 tablet (50 mg total) by mouth at bedtime., Disp: 30 tablet, Rfl: 5  Review of Systems  Review of Systems  Constitutional: Negative for fever, chills, weight loss, malaise/fatigue and diaphoresis.  HENT: Negative for hearing loss, ear pain, nosebleeds, congestion, sore throat, neck pain, tinnitus and ear discharge.   Eyes: Negative for blurred vision, double vision, photophobia, pain, discharge and redness.  Respiratory: Negative for cough, hemoptysis, sputum production, shortness of breath, wheezing and stridor.   Cardiovascular: Negative for chest pain, palpitations, orthopnea, claudication, leg swelling and PND.  Gastrointestinal: negative for abdominal pain. Negative for heartburn, nausea, vomiting, diarrhea, constipation, blood in stool and melena.  Genitourinary: Negative for dysuria, urgency, frequency, hematuria and flank pain.  Musculoskeletal: Negative for myalgias, back pain, joint pain and falls.  Skin: Negative for itching and rash.  Neurological: Negative for dizziness, tingling, tremors, sensory change, speech change, focal weakness, seizures, loss of consciousness, weakness and headaches.  Endo/Heme/Allergies: Negative for environmental allergies and polydipsia. Does not bruise/bleed easily.  Psychiatric/Behavioral: Negative for depression, suicidal ideas, hallucinations, memory loss and substance abuse. The patient is not nervous/anxious and does not have insomnia.        Objective:  Blood pressure (!) 159/98, pulse 70, height 5' 3 (1.6 m), weight 176 lb (79.8 kg),  last menstrual period 12/20/2023.   Physical Exam  Vitals reviewed. Constitutional: She is oriented to person, place, and time. She appears well-developed and well-nourished.  HENT:  Head: Normocephalic and atraumatic.        Right Ear: External ear normal.  Left Ear: External ear normal.  Nose: Nose normal.  Mouth/Throat: Oropharynx is clear and moist.  Eyes: Conjunctivae and EOM are normal. Pupils are equal, round, and reactive to light. Right eye exhibits no discharge. Left eye exhibits no discharge. No scleral icterus.  Neck: Normal range of motion. Neck supple. No tracheal deviation present. No thyromegaly present.  Cardiovascular: Normal rate, regular rhythm, normal heart sounds and intact distal pulses.  Exam reveals no gallop and no friction rub.   No murmur heard. Respiratory: Effort normal and breath sounds normal. No respiratory distress. She has no wheezes. She has no rales. She exhibits no tenderness.  GI: Soft. Bowel sounds are normal. She exhibits no distension and no mass. There is no tenderness. There is no rebound and no guarding.  Genitourinary:  Breasts no masses skin changes or nipple changes bilaterally      Vulva is normal without lesions Vagina is pink moist without discharge Cervix normal in appearance and pap is done Uterus is normal size shape and contour Adnexa is negative with normal sized ovaries   Musculoskeletal: Normal range of motion. She exhibits no edema and no tenderness.  Neurological: She is alert and oriented to person, place, and time. She has normal reflexes. She displays normal reflexes. No cranial nerve deficit. She exhibits normal muscle tone. Coordination normal.  Skin: Skin is warm and dry. No rash noted. No erythema. No pallor.  Psychiatric: She has a normal mood and affect. Her behavior is normal. Judgment and thought content normal.       Medications Ordered at today's visit: Meds ordered this encounter  Medications   traZODone   (DESYREL ) 50 MG tablet    Sig: Take 1 tablet (50 mg total) by mouth at bedtime.    Dispense:  30 tablet    Refill:  5   medroxyPROGESTERone  (PROVERA ) 10 MG tablet    Sig: Take 1 tablet (10 mg total) by mouth daily. First 10 days of each month    Dispense:  30 tablet    Refill:  3    Other orders placed at today's visit: Orders Placed This Encounter  Procedures   MM 3D SCREENING MAMMOGRAM BILATERAL BREAST     ASSESSMENT + PLAN:    ICD-10-CM   1. Well woman exam with routine gynecological exam  Z01.419     2. Encounter for gynecological examination with Papanicolaou smear of cervix  Z01.419 Cytology - PAP( [facility])    3. Perimenopause  N95.1    cycle with provera  next 3 months and evaluate the bleeding pattern    4. Breast cancer screening by mammogram  Z12.31 MM 3D SCREENING MAMMOGRAM BILATERAL BREAST    MM 3D SCREENING MAMMOGRAM BILATERAL BREAST    5. Encounter for screening mammogram for malignant neoplasm of breast  Z12.31 MM 3D SCREENING MAMMOGRAM BILATERAL BREAST          Return if symptoms worsen or fail to improve, for make follwo up after 04/16/24 with Dr Jayne.

## 2024-01-17 ENCOUNTER — Ambulatory Visit (HOSPITAL_COMMUNITY)
Admission: RE | Admit: 2024-01-17 | Discharge: 2024-01-17 | Disposition: A | Discharge status: Home / Self Care | Payer: 59 | Source: Ambulatory Visit | Attending: Obstetrics & Gynecology | Admitting: Obstetrics & Gynecology

## 2024-01-17 DIAGNOSIS — Z1231 Encounter for screening mammogram for malignant neoplasm of breast: Secondary | ICD-10-CM | POA: Diagnosis not present

## 2024-01-17 LAB — CYTOLOGY - PAP
Chlamydia: NEGATIVE
Comment: NEGATIVE
Comment: NEGATIVE
Comment: NORMAL
Diagnosis: NEGATIVE
Diagnosis: REACTIVE
High risk HPV: NEGATIVE
Neisseria Gonorrhea: NEGATIVE

## 2024-01-18 ENCOUNTER — Encounter: Payer: Self-pay | Admitting: Obstetrics & Gynecology

## 2024-02-02 ENCOUNTER — Other Ambulatory Visit: Payer: Self-pay | Admitting: Obstetrics & Gynecology

## 2024-02-11 ENCOUNTER — Ambulatory Visit: Payer: 59 | Admitting: Family Medicine

## 2024-06-26 ENCOUNTER — Encounter (INDEPENDENT_AMBULATORY_CARE_PROVIDER_SITE_OTHER): Payer: Self-pay | Admitting: *Deleted
